# Patient Record
Sex: Female | Born: 1995 | Race: Black or African American | Hispanic: No | Marital: Single | Smoking: Never smoker
Health system: Southern US, Community
[De-identification: ages and names within clinical notes are randomized; demographics above are authoritative.]

## PROBLEM LIST (undated history)

## (undated) DIAGNOSIS — I10 Essential (primary) hypertension: Secondary | ICD-10-CM

## (undated) DIAGNOSIS — R109 Unspecified abdominal pain: Secondary | ICD-10-CM

## (undated) DIAGNOSIS — J45909 Unspecified asthma, uncomplicated: Secondary | ICD-10-CM

## (undated) DIAGNOSIS — O26892 Other specified pregnancy related conditions, second trimester: Secondary | ICD-10-CM

## (undated) DIAGNOSIS — K219 Gastro-esophageal reflux disease without esophagitis: Secondary | ICD-10-CM

## (undated) HISTORY — DX: Other specified pregnancy related conditions, second trimester: O26.892

## (undated) HISTORY — PX: WISDOM TOOTH EXTRACTION: SHX21

## (undated) HISTORY — DX: Unspecified abdominal pain: R10.9

## (undated) HISTORY — DX: Unspecified asthma, uncomplicated: J45.909

---
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
## 2013-10-02 ENCOUNTER — Emergency Department: Payer: Self-pay | Admitting: Emergency Medicine

## 2013-10-02 LAB — URINALYSIS, COMPLETE
BLOOD: NEGATIVE
Bilirubin,UR: NEGATIVE
GLUCOSE, UR: NEGATIVE mg/dL (ref 0–75)
Ketone: NEGATIVE
LEUKOCYTE ESTERASE: NEGATIVE
Nitrite: NEGATIVE
PH: 6 (ref 4.5–8.0)
Protein: NEGATIVE
SPECIFIC GRAVITY: 1.024 (ref 1.003–1.030)
Squamous Epithelial: 10
WBC UR: 2 /HPF (ref 0–5)

## 2013-10-02 LAB — WET PREP, GENITAL

## 2013-10-02 LAB — GC/CHLAMYDIA PROBE AMP

## 2013-10-08 ENCOUNTER — Emergency Department: Payer: Self-pay | Admitting: Emergency Medicine

## 2014-12-03 ENCOUNTER — Emergency Department
Admission: EM | Admit: 2014-12-03 | Discharge: 2014-12-03 | Disposition: A | Discharge status: Home / Self Care | Payer: Medicaid Other | Attending: Emergency Medicine | Admitting: Emergency Medicine

## 2014-12-03 ENCOUNTER — Encounter: Payer: Self-pay | Admitting: Emergency Medicine

## 2014-12-03 DIAGNOSIS — R1013 Epigastric pain: Secondary | ICD-10-CM

## 2014-12-03 DIAGNOSIS — Z3202 Encounter for pregnancy test, result negative: Secondary | ICD-10-CM | POA: Insufficient documentation

## 2014-12-03 LAB — URINALYSIS COMPLETE WITH MICROSCOPIC (ARMC ONLY)
BACTERIA UA: NONE SEEN
Bilirubin Urine: NEGATIVE
GLUCOSE, UA: NEGATIVE mg/dL
HGB URINE DIPSTICK: NEGATIVE
KETONES UR: NEGATIVE mg/dL
LEUKOCYTES UA: NEGATIVE
NITRITE: NEGATIVE
Protein, ur: NEGATIVE mg/dL
SPECIFIC GRAVITY, URINE: 1.018 (ref 1.005–1.030)
pH: 7 (ref 5.0–8.0)

## 2014-12-03 LAB — COMPREHENSIVE METABOLIC PANEL
ALK PHOS: 92 U/L (ref 38–126)
ALT: 14 U/L (ref 14–54)
ANION GAP: 7 (ref 5–15)
AST: 20 U/L (ref 15–41)
Albumin: 4.5 g/dL (ref 3.5–5.0)
BILIRUBIN TOTAL: 0.7 mg/dL (ref 0.3–1.2)
BUN: 11 mg/dL (ref 6–20)
CALCIUM: 9.4 mg/dL (ref 8.9–10.3)
CO2: 27 mmol/L (ref 22–32)
Chloride: 101 mmol/L (ref 101–111)
Creatinine, Ser: 0.77 mg/dL (ref 0.44–1.00)
GFR calc Af Amer: >60 mL/min (ref >60)
GLUCOSE: 82 mg/dL (ref 65–99)
POTASSIUM: 3.8 mmol/L (ref 3.5–5.1)
Sodium: 135 mmol/L (ref 135–145)
Total Protein: 7.7 g/dL (ref 6.5–8.1)

## 2014-12-03 LAB — CBC
HEMATOCRIT: 41.1 % (ref 35.0–47.0)
HEMOGLOBIN: 13.8 g/dL (ref 12.0–16.0)
MCH: 29.7 pg (ref 26.0–34.0)
MCHC: 33.5 g/dL (ref 32.0–36.0)
MCV: 88.5 fL (ref 80.0–100.0)
Platelets: 243 10*3/uL (ref 150–440)
RBC: 4.64 MIL/uL (ref 3.80–5.20)
RDW: 12.7 % (ref 11.5–14.5)
WBC: 8.5 10*3/uL (ref 3.6–11.0)

## 2014-12-03 LAB — POCT PREGNANCY, URINE: Preg Test, Ur: NEGATIVE

## 2014-12-03 LAB — LIPASE, BLOOD: Lipase: 34 U/L (ref 22–51)

## 2014-12-03 MED ORDER — GI COCKTAIL ~~LOC~~
ORAL | Status: AC
  Filled 2014-12-03: qty 30

## 2014-12-03 MED ORDER — GI COCKTAIL ~~LOC~~
30.0000 mL | Freq: Once | ORAL | Status: AC
  Administered 2014-12-03: 30 mL via ORAL

## 2014-12-03 MED ORDER — SUCRALFATE 1 G PO TABS: 1.0000 g | ORAL_TABLET | Freq: Four times a day (QID) | ORAL | Status: DC

## 2014-12-03 MED ORDER — PANTOPRAZOLE SODIUM 40 MG PO TBEC: 40.0000 mg | DELAYED_RELEASE_TABLET | Freq: Every day | ORAL | Status: DC

## 2014-12-03 NOTE — ED Provider Notes (Signed)
[facility] Emergency Department Provider Note   ____________________________________________  Time seen: 1700  I have reviewed the triage vital signs and the nursing notes.   HISTORY  Chief Complaint Abdominal Pain   History limited by: Not Limited   HPI Tamara Flynn is a 19 y.o. female who presents to the emergency department today with chief complaint ofabdominal pain. The patient states it is located in the epigastric region. It is sharp. It is severe. She states it started yesterday. It has been constant. She has had some associated bilateral back pain. She has had some nausea but no vomiting. Denies any change in defecation. Denies that the pain is worse with eating.     History reviewed. No pertinent past medical history.  There are no active problems to display for this patient.   History reviewed. No pertinent past surgical history.  No current outpatient prescriptions on file.  Allergies Review of patient's allergies indicates no known allergies.  History reviewed. No pertinent family history.  Social History Social History  Substance Use Topics  . Smoking status: Never Smoker   . Smokeless tobacco: None  . Alcohol Use: No    Review of Systems  Constitutional: Negative for fever. Cardiovascular: Negative for chest pain. Respiratory: Negative for shortness of breath. Gastrointestinal: Positive for abdominal pain Genitourinary: Negative for dysuria. Musculoskeletal: Negative for back pain. Skin: Negative for rash. Neurological: Negative for headaches, focal weakness or numbness.  10-point ROS otherwise negative.  ____________________________________________   PHYSICAL EXAM:  VITAL SIGNS: ED Triage Vitals  Enc Vitals Group     BP 12/03/14 1622 117/76 mmHg     Pulse Rate 12/03/14 1622 94     Resp 12/03/14 1622 20     Temp 12/03/14 1622 98.3 F (36.8 C)     Temp Source 12/03/14 1622 Oral     SpO2 12/03/14 1622  99 %     Weight 12/03/14 1622 118 lb (53.524 kg)     Height 12/03/14 1622 5' (1.524 m)     Head Cir --      Peak Flow --      Pain Score 12/03/14 1604 6   Constitutional: Alert and oriented. Well appearing and in no distress. Eyes: Conjunctivae are normal. PERRL. Normal extraocular movements. ENT   Head: Normocephalic and atraumatic.   Nose: No congestion/rhinnorhea.   Mouth/Throat: Mucous membranes are moist.   Neck: No stridor. Hematological/Lymphatic/Immunilogical: No cervical lymphadenopathy. Cardiovascular: Normal rate, regular rhythm.  No murmurs, rubs, or gallops. Respiratory: Normal respiratory effort without tachypnea nor retractions. Breath sounds are clear and equal bilaterally. No wheezes/rales/rhonchi. Gastrointestinal: Soft and nontender. No distention. There is no CVA tenderness. Genitourinary: Deferred Musculoskeletal: Normal range of motion in all extremities. No joint effusions.  No lower extremity tenderness nor edema. Neurologic:  Normal speech and language. No gross focal neurologic deficits are appreciated. Speech is normal.  Skin:  Skin is warm, dry and intact. No rash noted. Psychiatric: Mood and affect are normal. Speech and behavior are normal. Patient exhibits appropriate insight and judgment.  ____________________________________________    LABS (pertinent positives/negatives)  Labs Reviewed  URINALYSIS COMPLETEWITH MICROSCOPIC ([place] ONLY) - Abnormal; Notable for the following:    Color, Urine YELLOW (*)    APPearance HAZY (*)    Squamous Epithelial / LPF 6-30 (*)    All other components within normal limits  LIPASE, BLOOD  COMPREHENSIVE METABOLIC PANEL  CBC  POCT PREGNANCY, URINE     ____________________________________________   EKG  None  ____________________________________________    RADIOLOGY  None    ____________________________________________   PROCEDURES  Procedure(s) performed: None  Critical Care  performed: No  ____________________________________________   INITIAL IMPRESSION / ASSESSMENT AND PLAN / ED COURSE  Pertinent labs & imaging results that were available during my care of the patient were reviewed by me and considered in my medical decision making (see chart for details).  Patient presented to the emergency department today with epigastric pain. Blood work without any findings consistent with bad bacterial infection or pancreatitis. Liver enzymes normal. Bedside ultrasound without any gallstones. Patient stated she did feel better after a GI Cocktail. Will discharge home with antacids and sucralfate.  ____________________________________________   FINAL CLINICAL IMPRESSION(S) / ED DIAGNOSES  Final diagnoses:  Epigastric pain     Nance Pear, MD 12/03/14 1819

## 2014-12-03 NOTE — ED Notes (Signed)
19 year old female ambulatory without difficulty to ED33 with multiple medical complaints. Pt reports bilateral flank pain with "sharp pains that bring me to my knees" in addition to epigastric pain which, "I can't describe but I know it's there and it makes it harder to breathe." Pt also reports bilateral knee pain. Pt denies knee injury and notes that "sometimes my knees just give out." Pt also reports "migraine" headache to right side of head. Pt reports that she gets headaches often and "they just go away on their own." Pt reports that this headache is different because it is worse, "and when I shut my eyes, I can feel it there." Pt reports nausea but denies vomiting. Denies visual change or sensitivity to light. Of note, pt reports burning with urination. Pt is awake and alert with no acute distress noted during assessment.

## 2014-12-03 NOTE — ED Notes (Signed)
POCT pregnancy Negative

## 2014-12-03 NOTE — ED Notes (Signed)
Pt to ed with c/o abd pain and nausea x 2 days.   Denies vomiting, denies diarrhea.

## 2014-12-03 NOTE — Discharge Instructions (Signed)
Please seek medical attention for any high fevers, chest pain, shortness of breath, change in behavior, persistent vomiting, bloody stool or any other new or concerning symptoms. ° °Abdominal Pain °Many things can cause abdominal pain. Usually, abdominal pain is not caused by a disease and will improve without treatment. It can often be observed and treated at home. Your health care provider will do a physical exam and possibly order blood tests and X-rays to help determine the seriousness of your pain. However, in many cases, more time must pass before a clear cause of the pain can be found. Before that point, your health care provider may not know if you need more testing or further treatment. °HOME CARE INSTRUCTIONS  °Monitor your abdominal pain for any changes. The following actions may help to alleviate any discomfort you are experiencing: °· Only take over-the-counter or prescription medicines as directed by your health care provider. °· Do not take laxatives unless directed to do so by your health care provider. °· Try a clear liquid diet (broth, tea, or water) as directed by your health care provider. Slowly move to a bland diet as tolerated. °SEEK MEDICAL CARE IF: °· You have unexplained abdominal pain. °· You have abdominal pain associated with nausea or diarrhea. °· You have pain when you urinate or have a bowel movement. °· You experience abdominal pain that wakes you in the night. °· You have abdominal pain that is worsened or improved by eating food. °· You have abdominal pain that is worsened with eating fatty foods. °· You have a fever. °SEEK IMMEDIATE MEDICAL CARE IF:  °· Your pain does not go away within 2 hours. °· You keep throwing up (vomiting). °· Your pain is felt only in portions of the abdomen, such as the right side or the left lower portion of the abdomen. °· You pass bloody or black tarry stools. °MAKE SURE YOU: °· Understand these instructions.   °· Will watch your condition.   °· Will  get help right away if you are not doing well or get worse.   °Document Released: 12/02/2004 Document Revised: 02/27/2013 Document Reviewed: 11/01/2012 °[employer]® Patient Information ©2015 [employer]. This information is not intended to replace advice given to you by your health care provider. Make sure you discuss any questions you have with your health care provider. ° °

## 2015-07-24 ENCOUNTER — Encounter: Payer: Self-pay | Admitting: Emergency Medicine

## 2015-07-24 ENCOUNTER — Emergency Department
Admission: EM | Admit: 2015-07-24 | Discharge: 2015-07-24 | Disposition: A | Discharge status: Home / Self Care | Payer: Medicaid Other | Attending: Student | Admitting: Student

## 2015-07-24 DIAGNOSIS — R112 Nausea with vomiting, unspecified: Secondary | ICD-10-CM | POA: Diagnosis not present

## 2015-07-24 DIAGNOSIS — R519 Headache, unspecified: Secondary | ICD-10-CM

## 2015-07-24 DIAGNOSIS — R42 Dizziness and giddiness: Secondary | ICD-10-CM | POA: Insufficient documentation

## 2015-07-24 DIAGNOSIS — R51 Headache: Secondary | ICD-10-CM | POA: Diagnosis not present

## 2015-07-24 LAB — URINALYSIS COMPLETE WITH MICROSCOPIC (ARMC ONLY)
BILIRUBIN URINE: NEGATIVE
Bacteria, UA: NONE SEEN
GLUCOSE, UA: NEGATIVE mg/dL
HGB URINE DIPSTICK: NEGATIVE
KETONES UR: NEGATIVE mg/dL
LEUKOCYTES UA: NEGATIVE
NITRITE: NEGATIVE
PH: 5 (ref 5.0–8.0)
Protein, ur: NEGATIVE mg/dL
SPECIFIC GRAVITY, URINE: 1.025 (ref 1.005–1.030)

## 2015-07-24 LAB — CBC
HCT: 40.3 % (ref 35.0–47.0)
Hemoglobin: 13.6 g/dL (ref 12.0–16.0)
MCH: 29.7 pg (ref 26.0–34.0)
MCHC: 33.8 g/dL (ref 32.0–36.0)
MCV: 87.9 fL (ref 80.0–100.0)
PLATELETS: 255 10*3/uL (ref 150–440)
RBC: 4.58 MIL/uL (ref 3.80–5.20)
RDW: 12.8 % (ref 11.5–14.5)
WBC: 10.6 10*3/uL (ref 3.6–11.0)

## 2015-07-24 LAB — COMPREHENSIVE METABOLIC PANEL
ALK PHOS: 92 U/L (ref 38–126)
ALT: 15 U/L (ref 14–54)
ANION GAP: 8 (ref 5–15)
AST: 20 U/L (ref 15–41)
Albumin: 4.4 g/dL (ref 3.5–5.0)
BILIRUBIN TOTAL: 0.3 mg/dL (ref 0.3–1.2)
BUN: 10 mg/dL (ref 6–20)
CALCIUM: 9.1 mg/dL (ref 8.9–10.3)
CO2: 26 mmol/L (ref 22–32)
Chloride: 105 mmol/L (ref 101–111)
Creatinine, Ser: 0.77 mg/dL (ref 0.44–1.00)
GLUCOSE: 93 mg/dL (ref 65–99)
POTASSIUM: 3.6 mmol/L (ref 3.5–5.1)
Sodium: 139 mmol/L (ref 135–145)
TOTAL PROTEIN: 7.5 g/dL (ref 6.5–8.1)

## 2015-07-24 LAB — POCT PREGNANCY, URINE: Preg Test, Ur: NEGATIVE

## 2015-07-24 LAB — LIPASE, BLOOD: LIPASE: 31 U/L (ref 11–51)

## 2015-07-24 MED ORDER — ONDANSETRON HCL 4 MG/2ML IJ SOLN
4.0000 mg | Freq: Once | INTRAMUSCULAR | Status: AC
  Administered 2015-07-24: 4 mg via INTRAVENOUS
  Filled 2015-07-24: qty 2

## 2015-07-24 MED ORDER — IBUPROFEN 600 MG PO TABS
600.0000 mg | ORAL_TABLET | Freq: Once | ORAL | Status: AC
  Administered 2015-07-24: 600 mg via ORAL
  Filled 2015-07-24: qty 1

## 2015-07-24 MED ORDER — ACETAMINOPHEN 500 MG PO TABS
1000.0000 mg | ORAL_TABLET | Freq: Once | ORAL | Status: AC
  Administered 2015-07-24: 1000 mg via ORAL
  Filled 2015-07-24: qty 2

## 2015-07-24 MED ORDER — SODIUM CHLORIDE 0.9 % IV BOLUS (SEPSIS)
1000.0000 mL | Freq: Once | INTRAVENOUS | Status: AC
  Administered 2015-07-24: 1000 mL via INTRAVENOUS

## 2015-07-24 NOTE — ED Notes (Signed)
Patient presents to the ED via EMS from work. Patient reports that she became dizzy and started to vomit while at work tonight; felt pre-syncopal. Of note, patient has had a non-specific headache x the last 4 days.

## 2015-07-24 NOTE — ED Notes (Signed)
MD Gayle at bedside. 

## 2015-07-24 NOTE — ED Notes (Deleted)
Patient presents to the ED via EMS from work. Patient reports that she became dizzy and started to vomit while at work tonight; felt pre-syncopal. Of note, patient has had a non-specific headache x the last 4 days.

## 2015-07-24 NOTE — ED Provider Notes (Addendum)
[facility] Emergency Department Provider Note   ____________________________________________  Time seen: Approximately 10:55 PM  I have reviewed the triage vital signs and the nursing notes.   HISTORY  Chief Complaint Dizziness; Headache; Nausea; and Emesis    HPI Tamara Flynn is a 20 y.o. female with no chronic medical problems who presents for evaluation of lightheadedness and vomiting today, gradual onset, initially severe, now resolved, no modifying factors. The patient reports that she was at work when she began feeling lightheaded and as if she was going to vomit. She had 3 episodes of nonbloody nonbilious emesis, felt as is she were going to faint and EMS was called. She did not faint. Currently she reports she feels well. She also reports that over the past 4 days she has had intermittent frontal headaches, currently the headache as 7 out of 10. She has not taking any medications to help alleviate the pain. She reports she has had headaches similar to this in the past, often times associated with photophobia and she currently has mild photophobia. She carries no formal diagnosis of migraines. No fevers, no abdominal pain, no diarrhea, no chest pain or difficulty breathing.   History reviewed. No pertinent past medical history.  There are no active problems to display for this patient.   History reviewed. No pertinent past surgical history.  Current Outpatient Rx  Name  Route  Sig  Dispense  Refill  . pantoprazole (PROTONIX) 40 MG tablet   Oral   Take 1 tablet (40 mg total) by mouth daily.   30 tablet   1   . sucralfate (CARAFATE) 1 G tablet   Oral   Take 1 tablet (1 g total) by mouth 4 (four) times daily.   60 tablet   0     Allergies Review of patient's allergies indicates no known allergies.  No family history on file.  Social History Social History  Substance Use Topics  . Smoking status: Never Smoker   . Smokeless  tobacco: None  . Alcohol Use: No    Review of Systems Constitutional: No fever/chills Eyes: No visual changes. ENT: No sore throat. Cardiovascular: Denies chest pain. Respiratory: Denies shortness of breath. Gastrointestinal: No abdominal pain.  +nausea, + vomiting.  No diarrhea.  No constipation. Genitourinary: Negative for dysuria. Musculoskeletal: Negative for back pain. Skin: Negative for rash. Neurological: Positive for headaches, no focal weakness or numbness.  10-point ROS otherwise negative.  ____________________________________________   PHYSICAL EXAM:  VITAL SIGNS: ED Triage Vitals  Enc Vitals Group     BP 07/24/15 2121 115/72 mmHg     Pulse Rate 07/24/15 2121 90     Resp 07/24/15 2121 14     Temp 07/24/15 2121 98.9 F (37.2 C)     Temp Source 07/24/15 2121 Oral     SpO2 07/24/15 2121 98 %     Weight 07/24/15 2121 129 lb (58.514 kg)     Height 07/24/15 2121 5' (1.524 m)     Head Cir --      Peak Flow --      Pain Score 07/24/15 2122 7     Pain Loc --      Pain Edu? --      Excl. in [place]? --     Constitutional: Alert and oriented. Well appearing and in no acute distress. Eyes: Conjunctivae are normal. PERRL. EOMI. Head: Atraumatic. Nose: No congestion/rhinnorhea. Mouth/Throat: Mucous membranes are moist.  Oropharynx non-erythematous. Neck: No stridor.  No  cervical spine tenderness to palpation. Supple without meningismus. Cardiovascular: Normal rate, regular rhythm. Grossly normal heart sounds.  Good peripheral circulation. Respiratory: Normal respiratory effort.  No retractions. Lungs CTAB. Gastrointestinal: Soft and nontender. No distention. No CVA tenderness. Genitourinary: deferred Musculoskeletal: No lower extremity tenderness nor edema.  No joint effusions. Neurologic:  Normal speech and language. No gross focal neurologic deficits are appreciated. No gait instability. 5 out of 5 strength bilateral upper and lower extremities, sensation intact to  light touch throughout, cranial nerves II through XII intact. Skin:  Skin is warm, dry and intact. No rash noted. Psychiatric: Mood and affect are normal. Speech and behavior are normal.  ____________________________________________   LABS (all labs ordered are listed, but only abnormal results are displayed)  Labs Reviewed  URINALYSIS COMPLETEWITH MICROSCOPIC ([place]) - Abnormal; Notable for the following:    Color, Urine YELLOW (*)    APPearance CLEAR (*)    Squamous Epithelial / LPF 0-5 (*)    All other components within normal limits  CBC  COMPREHENSIVE METABOLIC PANEL  LIPASE, BLOOD  POC URINE PREG, ED  POCT PREGNANCY, URINE   ____________________________________________  EKG  ED ECG REPORT I, Joanne Gavel, the attending physician, personally viewed and interpreted this ECG.   Date: 07/24/2015  EKG Time: 21:26  Rate: 80  Rhythm: normal EKG, normal sinus rhythm  Axis: normal  Intervals:none  ST&T Change: No acute ST elevation.  ____________________________________________  RADIOLOGY  none ____________________________________________   PROCEDURES  Procedure(s) performed: None  Critical Care performed: No  ____________________________________________   INITIAL IMPRESSION / ASSESSMENT AND PLAN / ED COURSE  Pertinent labs & imaging results that were available during my care of the patient were reviewed by me and considered in my medical decision making (see chart for details).  Tamara Flynn is a 20 y.o. female with no chronic medical problems who presents for evaluation of lightheadedness and vomiting today. On exam, she is very well-appearing and in no acute distress, her vital signs are stable, she is afebrile. She has an intact neurological examination as well as a benign abdominal exam. I reviewed her labs. CBC, CMP, lipase unremarkable. Negative pregnancy test, urinalysis consistent with infection. Nonspecific lightheadedness and vomiting,  possibly secondary to viral syndrome or possibly related to headache/migraine. Doubt meningitis or subarachnoid hemorrhage. Patient reports that she feels much better after IV fluids and Zofran as well as Motrin and Tylenol. Currently, she reports complete resolution of her headache. She is tolerating by mouth intake without vomiting. Discussed return precautions, new for close PCP follow-up and she is comfortable with the discharge plan. DC home. ____________________________________________   FINAL CLINICAL IMPRESSION(S) / ED DIAGNOSES  Final diagnoses:  Lightheadedness  Non-intractable vomiting with nausea, vomiting of unspecified type  Acute nonintractable headache, unspecified headache type      NEW MEDICATIONS STARTED DURING THIS VISIT:  New Prescriptions   No medications on file     Note:  This document was prepared using Dragon voice recognition software and may include unintentional dictation errors.    Joanne Gavel, MD 07/24/15 [id number]  Joanne Gavel, MD 07/24/15 [id number]

## 2015-07-24 NOTE — ED Notes (Signed)
Pt given urine cup and instructed on proper use. Pt verbalized understanding. NAD noted.

## 2015-07-29 ENCOUNTER — Emergency Department: Payer: Medicaid Other

## 2015-07-29 ENCOUNTER — Emergency Department
Admission: EM | Admit: 2015-07-29 | Discharge: 2015-07-29 | Disposition: A | Discharge status: Home / Self Care | Payer: Medicaid Other | Attending: Emergency Medicine | Admitting: Emergency Medicine

## 2015-07-29 ENCOUNTER — Encounter: Payer: Self-pay | Admitting: *Deleted

## 2015-07-29 DIAGNOSIS — S0093XA Contusion of unspecified part of head, initial encounter: Secondary | ICD-10-CM | POA: Insufficient documentation

## 2015-07-29 DIAGNOSIS — Y999 Unspecified external cause status: Secondary | ICD-10-CM | POA: Diagnosis not present

## 2015-07-29 DIAGNOSIS — Z79899 Other long term (current) drug therapy: Secondary | ICD-10-CM | POA: Insufficient documentation

## 2015-07-29 DIAGNOSIS — Y92410 Unspecified street and highway as the place of occurrence of the external cause: Secondary | ICD-10-CM | POA: Diagnosis not present

## 2015-07-29 DIAGNOSIS — S0990XA Unspecified injury of head, initial encounter: Secondary | ICD-10-CM | POA: Diagnosis present

## 2015-07-29 DIAGNOSIS — Y9389 Activity, other specified: Secondary | ICD-10-CM | POA: Insufficient documentation

## 2015-07-29 MED ORDER — ONDANSETRON 8 MG PO TBDP
8.0000 mg | ORAL_TABLET | Freq: Once | ORAL | Status: AC
  Administered 2015-07-29: 8 mg via ORAL
  Filled 2015-07-29: qty 1

## 2015-07-29 MED ORDER — IBUPROFEN 800 MG PO TABS
800.0000 mg | ORAL_TABLET | Freq: Once | ORAL | Status: AC
  Administered 2015-07-29: 800 mg via ORAL
  Filled 2015-07-29: qty 1

## 2015-07-29 MED ORDER — ONDANSETRON HCL 8 MG PO TABS: 8.0000 mg | ORAL_TABLET | Freq: Three times a day (TID) | ORAL | Status: DC | PRN

## 2015-07-29 MED ORDER — TRAMADOL HCL 50 MG PO TABS: 50.0000 mg | ORAL_TABLET | Freq: Four times a day (QID) | ORAL | Status: DC | PRN

## 2015-07-29 MED ORDER — IBUPROFEN 800 MG PO TABS: 800.0000 mg | ORAL_TABLET | Freq: Three times a day (TID) | ORAL | Status: DC | PRN

## 2015-07-29 MED ORDER — TRAMADOL HCL 50 MG PO TABS
50.0000 mg | ORAL_TABLET | Freq: Once | ORAL | Status: AC
  Administered 2015-07-29: 50 mg via ORAL
  Filled 2015-07-29: qty 1
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
## 2015-07-29 NOTE — Discharge Instructions (Signed)
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
## 2016-03-29 ENCOUNTER — Emergency Department
Admission: EM | Admit: 2016-03-29 | Discharge: 2016-03-29 | Disposition: A | Discharge status: Home / Self Care | Payer: Medicaid Other | Attending: Emergency Medicine | Admitting: Emergency Medicine

## 2016-03-29 ENCOUNTER — Encounter: Payer: Self-pay | Admitting: *Deleted

## 2016-03-29 DIAGNOSIS — J029 Acute pharyngitis, unspecified: Secondary | ICD-10-CM | POA: Insufficient documentation

## 2016-03-29 NOTE — ED Provider Notes (Signed)
[facility] Emergency Department Provider Note  ___________________________________________   First MD Initiated Contact with Patient 03/29/16 1315     (approximate)  I have reviewed the triage vital signs and the nursing notes.   HISTORY  Chief Complaint Sore Throat   HPI Tamara Flynn is a 21 y.o. female is here with complaint of sore throat that she woke up with this morning. Patient denies any exposure to strep throat, no fever or chills, no nausea or vomiting. Patient denies any upper respiratory symptoms. Patient states she has not taken any over-the-counter medication for her throat pain. She rates her pain as 6/10.   History reviewed. No pertinent past medical history.  There are no active problems to display for this patient.   History reviewed. No pertinent surgical history.  Prior to Admission medications   Medication Sig Start Date End Date Taking? Authorizing Provider  ibuprofen (ADVIL,MOTRIN) 800 MG tablet Take 1 tablet (800 mg total) by mouth every 8 (eight) hours as needed. 07/29/15   Sable Feil, PA-C  ondansetron (ZOFRAN) 8 MG tablet Take 1 tablet (8 mg total) by mouth every 8 (eight) hours as needed for nausea or vomiting. 07/29/15   Sable Feil, PA-C  pantoprazole (PROTONIX) 40 MG tablet Take 1 tablet (40 mg total) by mouth daily. 12/03/14 12/03/15  Nance Pear, MD  sucralfate (CARAFATE) 1 G tablet Take 1 tablet (1 g total) by mouth 4 (four) times daily. 12/03/14 12/03/15  Nance Pear, MD  traMADol (ULTRAM) 50 MG tablet Take 1 tablet (50 mg total) by mouth every 6 (six) hours as needed for moderate pain. 07/29/15   Sable Feil, PA-C    Allergies Patient has no known allergies.  History reviewed. No pertinent family history.  Social History Social History  Substance Use Topics  . Smoking status: Never Smoker  . Smokeless tobacco: Not on file  . Alcohol use No    Review of Systems Constitutional: No  fever/chills Eyes: No visual changes. ENT: Positive sore throat. Negative ear pain. Cardiovascular: Denies chest pain. Respiratory: Denies shortness of breath. Gastrointestinal:  No nausea, no vomiting.  Musculoskeletal: Denies muscle aches. Skin: Negative for rash. Neurological: Negative for headaches, focal weakness or numbness.  10-point ROS otherwise negative.  ____________________________________________   PHYSICAL EXAM:  VITAL SIGNS: ED Triage Vitals [03/29/16 1258]  Enc Vitals Group     BP 129/80     Pulse Rate (!) 102     Resp 18     Temp 98.1 F (36.7 C)     Temp Source Oral     SpO2 98 %     Weight 122 lb (55.3 kg)     Height 5' (1.524 m)     Head Circumference      Peak Flow      Pain Score 6     Pain Loc      Pain Edu?      Excl. in [place]?     Constitutional: Alert and oriented. Well appearing and in no acute distress. Eyes: Conjunctivae are normal. PERRL. EOMI. Head: Atraumatic. Nose: No congestion/rhinnorhea. EACs and TMs are clear bilaterally. Mouth/Throat: Mucous membranes are moist.  Oropharynx non-erythematous. Positive posterior drainage noted. Neck: No stridor.   Hematological/Lymphatic/Immunilogical: No cervical lymphadenopathy. Cardiovascular: Normal rate, regular rhythm. Grossly normal heart sounds.  Good peripheral circulation. Respiratory: Normal respiratory effort.  No retractions. Lungs CTAB. Musculoskeletal: His upper and lower extremities without difficulty. Normal gait was noted. Neurologic:  Normal speech  and language. No gross focal neurologic deficits are appreciated. No gait instability. Skin:  Skin is warm, dry and intact. No rash noted. Psychiatric: Mood and affect are normal. Speech and behavior are normal.  ____________________________________________   LABS (all labs ordered are listed, but only abnormal results are displayed)  Labs Reviewed - No data to display   PROCEDURES  Procedure(s) performed:  None  Procedures  Critical Care performed: No  ____________________________________________   INITIAL IMPRESSION / ASSESSMENT AND PLAN / ED COURSE  Pertinent labs & imaging results that were available during my care of the patient were reviewed by me and considered in my medical decision making (see chart for details).  Patient was made aware that her throat was not red and did not show any signs of exudate. Neck was negative for adenopathy. Patient was instructed to obtain over-the-counter Zyrtec or Claritin. She may also use saline nose spray. We also discussed humidifier if her house is particularly dry. She'll follow up with [facility] if any continued problems.    ___________________________________________   FINAL CLINICAL IMPRESSION(S) / ED DIAGNOSES  Final diagnoses:  Sore throat      NEW MEDICATIONS STARTED DURING THIS VISIT:  New Prescriptions   No medications on file     Note:  This document was prepared using Dragon voice recognition software and may include unintentional dictation errors.    Johnn Hai, PA-C 03/29/16 [place], MD 03/29/16 1400

## 2016-03-29 NOTE — ED Triage Notes (Signed)
States sore throat since this AM

## 2016-03-29 NOTE — Discharge Instructions (Signed)
Take Tylenol or ibuprofen as needed for throat pain. Use saltwater gargles frequently. Avoid foods with high acid content such as tomatoes or orange juice. Take over-the-counter decongestants/antihistamines such as Zyrtec or Claritin for posterior drainage. You may also use saline nose spray. Follow-up with her primary care doctor if any continued problems.

## 2016-03-30 ENCOUNTER — Encounter: Payer: Self-pay | Admitting: Emergency Medicine

## 2016-03-30 ENCOUNTER — Emergency Department
Admission: EM | Admit: 2016-03-30 | Discharge: 2016-03-30 | Disposition: A | Discharge status: Home / Self Care | Payer: Medicaid Other | Attending: Emergency Medicine | Admitting: Emergency Medicine

## 2016-03-30 DIAGNOSIS — J020 Streptococcal pharyngitis: Secondary | ICD-10-CM

## 2016-03-30 DIAGNOSIS — Z79899 Other long term (current) drug therapy: Secondary | ICD-10-CM | POA: Insufficient documentation

## 2016-03-30 DIAGNOSIS — H9201 Otalgia, right ear: Secondary | ICD-10-CM

## 2016-03-30 LAB — POCT RAPID STREP A: Streptococcus, Group A Screen (Direct): POSITIVE — AB

## 2016-03-30 MED ORDER — DIPHENHYDRAMINE HCL 12.5 MG/5ML PO ELIX
25.0000 mg | ORAL_SOLUTION | Freq: Once | ORAL | Status: AC
  Administered 2016-03-30: 25 mg via ORAL
  Filled 2016-03-30: qty 10

## 2016-03-30 MED ORDER — PSEUDOEPH-BROMPHEN-DM 30-2-10 MG/5ML PO SYRP: 5.0000 mL | ORAL_SOLUTION | Freq: Four times a day (QID) | ORAL | 0 refills | Status: DC | PRN

## 2016-03-30 MED ORDER — ACETAMINOPHEN 325 MG PO TABS
650.0000 mg | ORAL_TABLET | Freq: Once | ORAL | Status: AC
  Administered 2016-03-30: 650 mg via ORAL
  Filled 2016-03-30: qty 2

## 2016-03-30 MED ORDER — IBUPROFEN 600 MG PO TABS: 600.0000 mg | ORAL_TABLET | Freq: Three times a day (TID) | ORAL | 0 refills | Status: DC | PRN

## 2016-03-30 MED ORDER — LIDOCAINE VISCOUS 2 % MT SOLN
15.0000 mL | Freq: Once | OROMUCOSAL | Status: AC
  Administered 2016-03-30: 15 mL via OROMUCOSAL
  Filled 2016-03-30: qty 15

## 2016-03-30 MED ORDER — AMOXICILLIN 500 MG PO CAPS: 500.0000 mg | ORAL_CAPSULE | Freq: Three times a day (TID) | ORAL | 0 refills | Status: DC

## 2016-03-30 MED ORDER — IBUPROFEN 600 MG PO TABS: 600.0000 mg | ORAL_TABLET | Freq: Once | ORAL | Status: DC

## 2016-03-30 NOTE — ED Notes (Addendum)
See triage note  Was seen yesterday with sore throat  Was afebrile at that time  Developed fever during and right ear pain during the night  states she took ibu prior to arrival

## 2016-03-30 NOTE — ED Triage Notes (Signed)
Pt reports fever and ear pain.

## 2016-03-30 NOTE — ED Provider Notes (Signed)
[facility] Emergency Department Provider Note   ____________________________________________   First MD Initiated Contact with Patient 03/30/16 1628     (approximate)  I have reviewed the triage vital signs and the nursing notes.   HISTORY  Chief Complaint Otalgia and Fever    HPI Tamara Flynn is a 21 y.o. female patient complaining of fever and right ear pain for 2 days. Patient was seen yesterday for same complaint and was afebrile time. Patient state fever and ear pain develop over night. Patient took 300 mg ibuprofen elixir prior to arrival. Patient rates her pain discomfort as a 6/10. No other palliative measures for his complaint. No other palliative measures for her complaint. Patient has taken a flu shot this season. Patient denies nausea, vomiting, or diarrhea.  History reviewed. No pertinent past medical history.  There are no active problems to display for this patient.   History reviewed. No pertinent surgical history.  Prior to Admission medications   Medication Sig Start Date End Date Taking? Authorizing Provider  amoxicillin (AMOXIL) 500 MG capsule Take 1 capsule (500 mg total) by mouth 3 (three) times daily. 03/30/16   Sable Feil, PA-C  brompheniramine-pseudoephedrine-DM 30-2-10 MG/5ML syrup Take 5 mLs by mouth 4 (four) times daily as needed. 03/30/16   Sable Feil, PA-C  ibuprofen (ADVIL,MOTRIN) 600 MG tablet Take 1 tablet (600 mg total) by mouth every 8 (eight) hours as needed. 03/30/16   Sable Feil, PA-C  ibuprofen (ADVIL,MOTRIN) 800 MG tablet Take 1 tablet (800 mg total) by mouth every 8 (eight) hours as needed. 07/29/15   Sable Feil, PA-C  ondansetron (ZOFRAN) 8 MG tablet Take 1 tablet (8 mg total) by mouth every 8 (eight) hours as needed for nausea or vomiting. 07/29/15   Sable Feil, PA-C  pantoprazole (PROTONIX) 40 MG tablet Take 1 tablet (40 mg total) by mouth daily. 12/03/14 12/03/15  Nance Pear, MD    sucralfate (CARAFATE) 1 G tablet Take 1 tablet (1 g total) by mouth 4 (four) times daily. 12/03/14 12/03/15  Nance Pear, MD  traMADol (ULTRAM) 50 MG tablet Take 1 tablet (50 mg total) by mouth every 6 (six) hours as needed for moderate pain. 07/29/15   Sable Feil, PA-C    Allergies Patient has no known allergies.  No family history on file.  Social History Social History  Substance Use Topics  . Smoking status: Never Smoker  . Smokeless tobacco: Never Used  . Alcohol use No    Review of Systems Constitutional: Fever Eyes: No visual changes. ENT: Sore throat and right ear pain  Cardiovascular: Denies chest pain. Respiratory: Denies shortness of breath. Gastrointestinal: No abdominal pain.  No nausea, no vomiting.  No diarrhea.  No constipation. Genitourinary: Negative for dysuria. Musculoskeletal: Negative for back pain. Skin: Negative for rash. Neurological: Negative for headaches, focal weakness or numbness.   ____________________________________________   PHYSICAL EXAM:  VITAL SIGNS: ED Triage Vitals  Enc Vitals Group     BP 03/30/16 1611 124/82     Pulse Rate 03/30/16 1611 (!) 116     Resp 03/30/16 1611 18     Temp 03/30/16 1611 (!) 103.1 F (39.5 C)     Temp Source 03/30/16 1611 Oral     SpO2 03/30/16 1611 98 %     Weight 03/30/16 1607 122 lb (55.3 kg)     Height 03/30/16 1607 5' (1.524 m)     Head Circumference --  Peak Flow --      Pain Score 03/30/16 1608 6     Pain Loc --      Pain Edu? --      Excl. in [place]? --     Constitutional: Alert and oriented. Well appearing and in no acute distress. Eyes: Conjunctivae are normal. PERRL. EOMI. Head: Atraumatic. Nose: No congestion/rhinnorhea. Mouth/Throat: Mucous membranes are moist.  Oropharynx erythematous Right tonsil. No visible exudates. Neck: No stridor.  No cervical spine tenderness to palpation. Hematological/Lymphatic/Immunilogical: No cervical lymphadenopathy. Cardiovascular tachycardic  at 116.. Grossly normal heart sounds.  Good peripheral circulation. Respiratory: Normal respiratory effort.  No retractions. Lungs CTAB. Gastrointestinal: Soft and nontender. No distention. No abdominal bruits. No CVA tenderness. Musculoskeletal: No lower extremity tenderness nor edema.  No joint effusions. Neurologic:  Normal speech and language. No gross focal neurologic deficits are appreciated. No gait instability. Skin:  Skin is warm, dry and intact. No rash noted. Psychiatric: Mood and affect are normal. Speech and behavior are normal.  ____________________________________________   LABS (all labs ordered are listed, but only abnormal results are displayed)  Labs Reviewed  POCT RAPID STREP A - Abnormal; Notable for the following:       Result Value   Streptococcus, Group A Screen (Direct) POSITIVE (*)    All other components within normal limits   ____________________________________________  EKG   ____________________________________________  RADIOLOGY   ____________________________________________   PROCEDURES  Procedure(s) performed: None  Procedures  Critical Care performed: No  ____________________________________________   INITIAL IMPRESSION / ASSESSMENT AND PLAN / ED COURSE  Pertinent labs & imaging results that were available during my care of the patient were reviewed by me and considered in my medical decision making (see chart for details).  Positive rapid strep test. Patient given discharge Instructions. Patient given a work note. Patient given prescription for amoxicillin, ibuprofen and Bromfed-DM.      ____________________________________________   FINAL CLINICAL IMPRESSION(S) / ED DIAGNOSES  Final diagnoses:  Strep pharyngitis  Right ear pain      NEW MEDICATIONS STARTED DURING THIS VISIT:  New Prescriptions   AMOXICILLIN (AMOXIL) 500 MG CAPSULE    Take 1 capsule (500 mg total) by mouth 3 (three) times daily.    BROMPHENIRAMINE-PSEUDOEPHEDRINE-DM 30-2-10 MG/5ML SYRUP    Take 5 mLs by mouth 4 (four) times daily as needed.   IBUPROFEN (ADVIL,MOTRIN) 600 MG TABLET    Take 1 tablet (600 mg total) by mouth every 8 (eight) hours as needed.     Note:  This document was prepared using Dragon voice recognition software and may include unintentional dictation errors.    Sable Feil, PA-C 03/30/16 [place], MD 03/30/16 [phone]

## 2016-06-15 LAB — HM PAP SMEAR: HM Pap smear: NEGATIVE

## 2016-08-03 LAB — HM HIV SCREENING LAB: HM HIV Screening: NEGATIVE

## 2017-11-08 ENCOUNTER — Encounter: Payer: Self-pay | Admitting: Emergency Medicine

## 2017-11-08 ENCOUNTER — Emergency Department
Admission: EM | Admit: 2017-11-08 | Discharge: 2017-11-08 | Disposition: A | Discharge status: Home / Self Care | Payer: Medicaid Other | Attending: Emergency Medicine | Admitting: Emergency Medicine

## 2017-11-08 ENCOUNTER — Other Ambulatory Visit: Payer: Self-pay

## 2017-11-08 DIAGNOSIS — M545 Low back pain: Secondary | ICD-10-CM | POA: Diagnosis not present

## 2017-11-08 DIAGNOSIS — R1030 Lower abdominal pain, unspecified: Secondary | ICD-10-CM | POA: Insufficient documentation

## 2017-11-08 DIAGNOSIS — O26891 Other specified pregnancy related conditions, first trimester: Secondary | ICD-10-CM | POA: Insufficient documentation

## 2017-11-08 DIAGNOSIS — Z3A01 Less than 8 weeks gestation of pregnancy: Secondary | ICD-10-CM | POA: Insufficient documentation

## 2017-11-08 DIAGNOSIS — O9989 Other specified diseases and conditions complicating pregnancy, childbirth and the puerperium: Secondary | ICD-10-CM | POA: Diagnosis not present

## 2017-11-08 LAB — COMPREHENSIVE METABOLIC PANEL
ALK PHOS: 69 U/L (ref 38–126)
ALT: 25 U/L (ref 0–44)
AST: 26 U/L (ref 15–41)
Albumin: 4.3 g/dL (ref 3.5–5.0)
Anion gap: 7 (ref 5–15)
BUN: 8 mg/dL (ref 6–20)
CHLORIDE: 103 mmol/L (ref 98–111)
CO2: 26 mmol/L (ref 22–32)
Calcium: 9.1 mg/dL (ref 8.9–10.3)
Creatinine, Ser: 0.65 mg/dL (ref 0.44–1.00)
GLUCOSE: 83 mg/dL (ref 70–99)
POTASSIUM: 3.6 mmol/L (ref 3.5–5.1)
Sodium: 136 mmol/L (ref 135–145)
Total Bilirubin: 0.7 mg/dL (ref 0.3–1.2)
Total Protein: 7.6 g/dL (ref 6.5–8.1)

## 2017-11-08 LAB — CBC WITH DIFFERENTIAL/PLATELET
Basophils Absolute: 0 10*3/uL (ref 0–0.1)
Basophils Relative: 0 %
Eosinophils Absolute: 0.1 10*3/uL (ref 0–0.7)
Eosinophils Relative: 1 %
HCT: 37.5 % (ref 35.0–47.0)
HEMOGLOBIN: 13.1 g/dL (ref 12.0–16.0)
LYMPHS ABS: 2.3 10*3/uL (ref 1.0–3.6)
LYMPHS PCT: 19 %
MCH: 31.5 pg (ref 26.0–34.0)
MCHC: 35 g/dL (ref 32.0–36.0)
MCV: 90 fL (ref 80.0–100.0)
MONOS PCT: 7 %
Monocytes Absolute: 0.8 10*3/uL (ref 0.2–0.9)
NEUTROS PCT: 73 %
Neutro Abs: 8.9 10*3/uL — ABNORMAL HIGH (ref 1.4–6.5)
Platelets: 219 10*3/uL (ref 150–440)
RBC: 4.16 MIL/uL (ref 3.80–5.20)
RDW: 12.7 % (ref 11.5–14.5)
WBC: 12.2 10*3/uL — AB (ref 3.6–11.0)

## 2017-11-08 LAB — URINALYSIS, COMPLETE (UACMP) WITH MICROSCOPIC
BILIRUBIN URINE: NEGATIVE
Bacteria, UA: NONE SEEN
GLUCOSE, UA: NEGATIVE mg/dL
Hgb urine dipstick: NEGATIVE
Ketones, ur: NEGATIVE mg/dL
Leukocytes, UA: NEGATIVE
NITRITE: NEGATIVE
PROTEIN: NEGATIVE mg/dL
Specific Gravity, Urine: 1.011 (ref 1.005–1.030)
pH: 6 (ref 5.0–8.0)

## 2017-11-08 LAB — HCG, QUANTITATIVE, PREGNANCY: hCG, Beta Chain, Quant, S: 100408 m[IU]/mL — ABNORMAL HIGH (ref <5)

## 2017-11-08 LAB — POCT PREGNANCY, URINE: Preg Test, Ur: POSITIVE — AB

## 2017-11-08 NOTE — ED Triage Notes (Addendum)
Patient ambulatory to triage with steady gait, without difficulty or distress noted; pt reports lower abd and back cramping x wk; denies any accomp symptoms; denies vag bleeding or discharge; st approx [redacted]wks pregnant, G1; has appt next wk at [facility]

## 2017-11-09 ENCOUNTER — Telehealth: Payer: Self-pay | Admitting: Emergency Medicine

## 2017-11-09 NOTE — Telephone Encounter (Signed)
Called patient due to lwot to inquire about condition and follow up plans. Left message.   

## 2017-11-18 ENCOUNTER — Other Ambulatory Visit (HOSPITAL_COMMUNITY)
Admission: RE | Admit: 2017-11-18 | Discharge: 2017-11-18 | Disposition: A | Discharge status: Home / Self Care | Payer: Medicaid Other | Source: Ambulatory Visit | Attending: Maternal Newborn | Admitting: Maternal Newborn

## 2017-11-18 ENCOUNTER — Ambulatory Visit (INDEPENDENT_AMBULATORY_CARE_PROVIDER_SITE_OTHER): Payer: Medicaid Other | Admitting: Maternal Newborn

## 2017-11-18 ENCOUNTER — Encounter: Payer: Self-pay | Admitting: Maternal Newborn

## 2017-11-18 VITALS — BP 120/70 | Wt 132.0 lb

## 2017-11-18 DIAGNOSIS — O9989 Other specified diseases and conditions complicating pregnancy, childbirth and the puerperium: Secondary | ICD-10-CM | POA: Diagnosis not present

## 2017-11-18 DIAGNOSIS — N898 Other specified noninflammatory disorders of vagina: Secondary | ICD-10-CM | POA: Diagnosis present

## 2017-11-18 DIAGNOSIS — Z0189 Encounter for other specified special examinations: Secondary | ICD-10-CM

## 2017-11-18 DIAGNOSIS — Z3A00 Weeks of gestation of pregnancy not specified: Secondary | ICD-10-CM | POA: Insufficient documentation

## 2017-11-18 DIAGNOSIS — Z3400 Encounter for supervision of normal first pregnancy, unspecified trimester: Secondary | ICD-10-CM

## 2017-11-18 DIAGNOSIS — O0990 Supervision of high risk pregnancy, unspecified, unspecified trimester: Secondary | ICD-10-CM | POA: Insufficient documentation

## 2017-11-18 DIAGNOSIS — Z113 Encounter for screening for infections with a predominantly sexual mode of transmission: Secondary | ICD-10-CM

## 2017-11-18 DIAGNOSIS — Z3A01 Less than 8 weeks gestation of pregnancy: Secondary | ICD-10-CM

## 2017-11-18 DIAGNOSIS — O26899 Other specified pregnancy related conditions, unspecified trimester: Secondary | ICD-10-CM | POA: Diagnosis not present

## 2017-11-18 DIAGNOSIS — N912 Amenorrhea, unspecified: Secondary | ICD-10-CM

## 2017-11-18 DIAGNOSIS — Z3201 Encounter for pregnancy test, result positive: Secondary | ICD-10-CM | POA: Diagnosis not present

## 2017-11-18 LAB — POCT URINALYSIS DIPSTICK OB
GLUCOSE, UA: NEGATIVE
POC,PROTEIN,UA: NEGATIVE

## 2017-11-18 LAB — POCT URINE PREGNANCY: Preg Test, Ur: POSITIVE — AB

## 2017-11-18 LAB — OB RESULTS CONSOLE VARICELLA ZOSTER ANTIBODY, IGG: Varicella: NON-IMMUNE/NOT IMMUNE

## 2017-11-18 NOTE — Progress Notes (Signed)
NOB- no concerns ?

## 2017-11-18 NOTE — Progress Notes (Signed)
11/18/2017   Chief Complaint: Amenorrhea, positive home pregnancy test, desires prenatal care.  Transfer of Care Patient: no  History of Present Illness: Tamara Flynn is a 22 y.o. G1P0000 at [redacted]w[redacted]d based on Patient's last menstrual period on 09/24/2017 (lmp approximate), with an Estimated Date of Delivery: 07/01/2018, with the above CC.   Her periods were: irregular periods.  She has Positive signs or symptoms of nausea of pregnancy. She has Negative signs or symptoms of miscarriage or preterm labor She identifies Negative Zika risk factors for her and her partner On any medications around the time she conceived/early pregnancy: Yes - promethazine for nausea, metronidazole gel for recurrent BV and inhaler for asthma History of varicella: No    Review of Systems  Constitutional: Negative.   HENT: Negative.   Eyes: Negative.   Respiratory: Negative for cough and shortness of breath.   Cardiovascular: Negative for chest pain and palpitations.  Gastrointestinal: Positive for nausea.  Endocrine: Negative.   Genitourinary: Positive for vaginal discharge.  Musculoskeletal: Negative.   Skin: Negative.   Allergic/Immunologic: Negative.   Neurological: Negative.   Hematological: Negative.   Psychiatric/Behavioral: Negative.   Breast ROS: breast tenderness  ROS: Review of systems was otherwise negative, except as stated in the above HPI.  OBGYN History: As per HPI. OB History  Gravida Para Term Preterm AB Living  1 0 0 0 0 0  SAB TAB Ectopic Multiple Live Births  0 0 0 0      # Outcome Date GA Lbr Len/2nd Weight Sex Delivery Anes PTL Lv  1 Current             Any issues with any prior pregnancies: not applicable Any prior children are healthy, doing well, without any problems or issues: not applicable History of pap smears: Yes. Last pap smear was 10/2017 at [facility] per patient report. History of STIs: remote history of chlamydia   Past Medical History: Past Medical  History:  Diagnosis Date  . Asthma     Past Surgical History: Past Surgical History:  Procedure Laterality Date  . NO PAST SURGERIES      Family History:  Family History  Problem Relation Age of Onset  . Epilepsy Mother   . Epilepsy Father    She denies any female cancers, bleeding or blood clotting disorders.  She reports a history of autism in her maternal aunt and nephew. No other history of intellectual disability, birth defects or genetic disorders in her or the FOB's history  Social History:  Social History   Socioeconomic History  . Marital status: Single    Spouse name: Not on file  . Number of children: Not on file  . Years of education: Not on file  . Highest education level: Not on file  Occupational History  . Not on file  Social Needs  . Financial resource strain: Not on file  . Food insecurity:    Worry: Not on file    Inability: Not on file  . Transportation needs:    Medical: Not on file    Non-medical: Not on file  Tobacco Use  . Smoking status: Never Smoker  . Smokeless tobacco: Never Used  Substance and Sexual Activity  . Alcohol use: No  . Drug use: No  . Sexual activity: Yes    Partners: Male  Lifestyle  . Physical activity:    Days per week: Not on file    Minutes per session: Not on file  . Stress: Not  on file  Relationships  . Social connections:    Talks on phone: Not on file    Gets together: Not on file    Attends religious service: Not on file    Active member of club or organization: Not on file    Attends meetings of clubs or organizations: Not on file    Relationship status: Not on file  . Intimate partner violence:    Fear of current or ex partner: Not on file    Emotionally abused: Not on file    Physically abused: Not on file    Forced sexual activity: Not on file  Other Topics Concern  . Not on file  Social History Narrative  . Not on file   Any cats in the household: no No history of or current problems with  domestic violence.  Allergy: Allergies  Allergen Reactions  . Ciprocin-Fluocin-Procin [Fluocinolone]     Current Outpatient Medications:  Current Outpatient Medications:  .  FLOVENT HFA 220 MCG/ACT inhaler, INHALE 1 PUFF WITH SPACER TWICE A DAY FOR ASTHMA, Disp: , Rfl: 6 .  metroNIDAZOLE (METROGEL) 0.75 % vaginal gel, INSERT 1 APPLICATORFUL VAGINALLY AT BEDTIME 2 TIMES PER WEEK FOR 4 MONTHS FOR PREVENTION OF PV, Disp: , Rfl: 3 .  PROAIR HFA 108 (90 Base) MCG/ACT inhaler, INHALE 2 PUFFS BY MOUTH EVERY 4 TO 6 HOURS AS NEEDED FOR WHEEZING, COUGH OR SHORTNESS OF BREATH, Disp: , Rfl: 6 .  promethazine (PHENERGAN) 25 MG tablet, TAKE 1/2 TO 1 TABLET BY MOUTH EVERY 6 HOURS AS NEEDED FOR NAUSEA OR VOMITING, Disp: , Rfl: 0   Physical Exam:   BP 120/70   Wt 132 lb (59.9 kg)   LMP 09/24/2017 (LMP Unknown)   BMI 26.66 kg/m  Body mass index is 26.66 kg/m. Constitutional: Well nourished, well developed female in no acute distress.  Neck:  Supple, normal appearance, and no thyromegaly  Cardiovascular: S1, S2 normal, no murmur, rub or gallop, regular rate and rhythm Respiratory:  Clear to auscultation bilaterally. Normal respiratory effort Abdomen: no masses, hernias; diffusely non tender to palpation, non distended Breasts:  patient declines to have breast exam. Neuro/Psych:  Normal mood and affect.  Skin:  Warm and dry.  Lymphatic:  No inguinal lymphadenopathy.   Pelvic exam declined after shared decision making External genitalia, Bartholin's glands, Urethra, Skene's glands: within normal limits Vagina: within normal limits and with no blood in the vault, small amount of white discharge   Assessment: Tamara Flynn is a 22 y.o. G1P0000 at [redacted]w[redacted]d based on Patient's last menstrual period on 09/24/2017 (lmp approximate), with an Estimated Date of Delivery: 07/01/18, presenting for prenatal care.  Plan:  1) Avoid alcoholic beverages. 2) Patient encouraged not to smoke.  3) Discontinue the use of  all non-medicinal drugs and chemicals.  4) Take prenatal vitamins daily.  5) Seatbelt use advised 6) Nutrition, food safety (fish, cheese advisories, and high nitrite foods) and exercise discussed. 7) Hospital and practice style delivering at [facility] discussed  8) Patient is asked about travel to areas at risk for the [place] virus, and counseled to avoid travel and exposure to mosquitoes or sexual partners who may have themselves been exposed to the virus. Testing is discussed, and will be ordered as appropriate.  9) Childbirth classes at [facility] advised 10) Genetic Screening, such as with 1st Trimester Screening, cell free fetal DNA, AFP testing, and Ultrasound, as well as with amniocentesis and CVS as appropriate, is discussed with patient. She plans to have  genetic testing this pregnancy. 11) Dating scan ordered.  Problem list reviewed and updated.  Return in about 1 week (around 11/25/2017) for ROB and dating scan.  Avel Sensor, CNM [facility], [facility] 11/18/2017  10:26 AM

## 2017-11-19 LAB — URINE DRUG PANEL 7
Amphetamines, Urine: NEGATIVE ng/mL
Barbiturate Quant, Ur: NEGATIVE ng/mL
Benzodiazepine Quant, Ur: NEGATIVE ng/mL
Cannabinoid Quant, Ur: NEGATIVE ng/mL
Cocaine (Metab.): NEGATIVE ng/mL
OPIATE QUANT UR: NEGATIVE ng/mL
PCP QUANT UR: NEGATIVE ng/mL

## 2017-11-20 LAB — URINE CULTURE: Organism ID, Bacteria: NO GROWTH

## 2017-11-21 LAB — RPR+RH+ABO+RUB AB+AB SCR+CB...
Antibody Screen: NEGATIVE
HEMATOCRIT: 40.1 % (ref 34.0–46.6)
HEMOGLOBIN: 13.5 g/dL (ref 11.1–15.9)
HEP B S AG: NEGATIVE
HIV Screen 4th Generation wRfx: NONREACTIVE
MCH: 29.9 pg (ref 26.6–33.0)
MCHC: 33.7 g/dL (ref 31.5–35.7)
MCV: 89 fL (ref 79–97)
Platelets: 252 10*3/uL (ref 150–450)
RBC: 4.52 x10E6/uL (ref 3.77–5.28)
RDW: 12.2 % — AB (ref 12.3–15.4)
RH TYPE: POSITIVE
RPR Ser Ql: NONREACTIVE
Rubella Antibodies, IGG: <0.9 index — ABNORMAL LOW (ref >0.99)
Varicella zoster IgG: <135 index — ABNORMAL LOW (ref >165)
WBC: 9.8 10*3/uL (ref 3.4–10.8)

## 2017-11-21 LAB — HEMOGLOBINOPATHY EVALUATION
HGB A: 97.7 % (ref 96.4–98.8)
HGB C: 0 %
HGB S: 0 %
HGB VARIANT: 0 %
Hemoglobin A2 Quantitation: 2.3 % (ref 1.8–3.2)
Hemoglobin F Quantitation: 0 % (ref 0.0–2.0)

## 2017-11-22 LAB — CERVICOVAGINAL ANCILLARY ONLY
CHLAMYDIA, DNA PROBE: NEGATIVE
Candida vaginitis: NEGATIVE
NEISSERIA GONORRHEA: NEGATIVE

## 2017-11-25 ENCOUNTER — Encounter: Payer: Self-pay | Admitting: Maternal Newborn

## 2017-11-28 ENCOUNTER — Encounter: Payer: Self-pay | Admitting: Obstetrics and Gynecology

## 2017-11-28 ENCOUNTER — Ambulatory Visit (INDEPENDENT_AMBULATORY_CARE_PROVIDER_SITE_OTHER): Payer: Medicaid Other | Admitting: Obstetrics and Gynecology

## 2017-11-28 ENCOUNTER — Ambulatory Visit (INDEPENDENT_AMBULATORY_CARE_PROVIDER_SITE_OTHER): Payer: Medicaid Other

## 2017-11-28 ENCOUNTER — Telehealth: Payer: Self-pay | Admitting: Obstetrics and Gynecology

## 2017-11-28 VITALS — BP 112/62 | Wt 130.0 lb

## 2017-11-28 DIAGNOSIS — Z3401 Encounter for supervision of normal first pregnancy, first trimester: Secondary | ICD-10-CM

## 2017-11-28 DIAGNOSIS — Z3400 Encounter for supervision of normal first pregnancy, unspecified trimester: Secondary | ICD-10-CM

## 2017-11-28 DIAGNOSIS — O3680X0 Pregnancy with inconclusive fetal viability, not applicable or unspecified: Secondary | ICD-10-CM

## 2017-11-28 DIAGNOSIS — Z3A09 9 weeks gestation of pregnancy: Secondary | ICD-10-CM | POA: Diagnosis not present

## 2017-11-28 NOTE — Telephone Encounter (Signed)
Patient is requesting a Dr. Note for Work missed and for the next 48 hours due to stomach bug symptoms, Diarrhea and vomiting. Please advise

## 2017-11-28 NOTE — Telephone Encounter (Signed)
Pt aware.  States she needs one for school too.

## 2017-11-28 NOTE — Telephone Encounter (Signed)
Done and up front

## 2017-11-28 NOTE — Telephone Encounter (Signed)
Letter has been written and is waiting up front for her

## 2017-11-28 NOTE — Progress Notes (Signed)
ROB/Dating C/o Nausea all day, some cramping no spotting  Declines flu shot

## 2017-11-28 NOTE — Progress Notes (Signed)
    Routine Prenatal Care Visit  Subjective  Tamara Flynn is a 22 y.o. G1P0000 at [redacted]w[redacted]d being seen today for ongoing prenatal care.  She is currently monitored for the following issues for this low-risk pregnancy and has Supervision of normal first pregnancy, antepartum on their problem list.  ----------------------------------------------------------------------------------- Patient reports no complaints.   Contractions: Not present. Vag. Bleeding: None.  Movement: Absent. Denies leaking of fluid.  ----------------------------------------------------------------------------------- The following portions of the patient's history were reviewed and updated as appropriate: allergies, current medications, past family history, past medical history, past social history, past surgical history and problem list. Problem list updated.   Objective  Blood pressure 112/62, weight 130 lb (59 kg), last menstrual period 09/24/2017. Pregravid weight 127 lb (57.6 kg) Total Weight Gain 3 lb (1.361 kg) Urinalysis:      Fetal Status: Fetal Heart Rate (bpm): 184   Movement: Absent     General:  Alert, oriented and cooperative. Patient is in no acute distress.  Skin: Skin is warm and dry. No rash noted.   Cardiovascular: Normal heart rate noted  Respiratory: Normal respiratory effort, no problems with respiration noted  Abdomen: Soft, gravid, appropriate for gestational age. Pain/Pressure: Absent     Pelvic:  Cervical exam deferred        Extremities: Normal range of motion.     ental Status: Normal mood and affect. Normal behavior. Normal judgment and thought content.     Assessment   22 y.o. G1P0000 at [redacted]w[redacted]d by  07/01/2018, by Last Menstrual Period presenting for routine prenatal visit  Plan   FIRST Problems (from 11/18/17 to present)    Problem Noted Resolved   Supervision of normal first pregnancy, antepartum 11/18/2017 by Rexene Agent, CNM No   Overview Addendum 11/25/2017  3:16 PM by  Rexene Agent, [place] Prenatal Labs  Dating  Blood type: B/Positive/-- (09/13 1055)   Genetic Screen 1 Screen:    AFP:     Quad:     NIPS: Antibody:Negative (09/13 1055)  Anatomic [place]  Rubella: <0.90 (09/13 1055) Varicella: Non-immune  GTT Early:               Third trimester:  RPR: Non Reactive (09/13 1055)   Rhogam  HBsAg: Negative (09/13 1055)   TDaP vaccine                       Flu Shot: HIV: Non Reactive (09/13 1055)   Baby Food                                GBS:   Contraception  Pap: 10/2017, NILM per patient report  CBB     CS/VBAC    Support Person                  Gestational age appropriate obstetric precautions including but not limited to vaginal bleeding, contractions, leaking of fluid and fetal movement were reviewed in detail with the patient.    Discussed genetic screening options- desires [id number].  Discussed that she is nonimmune to rubella and varicella and to avoid people that may have those illnesses.  Given information on prenatal classes.  Declined flu shot Return in about 2 weeks (around 12/12/2017) for ROB.  Adrian Prows MD [facility], [facility] 11/28/17 10:59 AM

## 2017-12-13 ENCOUNTER — Other Ambulatory Visit: Payer: Self-pay | Admitting: Advanced Practice Midwife

## 2017-12-13 DIAGNOSIS — O219 Vomiting of pregnancy, unspecified: Secondary | ICD-10-CM

## 2017-12-13 MED ORDER — DOXYLAMINE-PYRIDOXINE 10-10 MG PO TBEC: 2.0000 | DELAYED_RELEASE_TABLET | Freq: Every day | ORAL | 2 refills | Status: DC

## 2017-12-13 NOTE — Progress Notes (Signed)
Diclegis Rx sent to patient pharmacy per her request.

## 2017-12-19 ENCOUNTER — Encounter: Payer: Self-pay | Admitting: Obstetrics and Gynecology

## 2017-12-19 ENCOUNTER — Ambulatory Visit (INDEPENDENT_AMBULATORY_CARE_PROVIDER_SITE_OTHER): Payer: Medicaid Other | Admitting: Obstetrics and Gynecology

## 2017-12-19 VITALS — BP 118/74 | Wt 132.0 lb

## 2017-12-19 DIAGNOSIS — Z3A12 12 weeks gestation of pregnancy: Secondary | ICD-10-CM

## 2017-12-19 DIAGNOSIS — Z3400 Encounter for supervision of normal first pregnancy, unspecified trimester: Secondary | ICD-10-CM

## 2017-12-19 DIAGNOSIS — Z1379 Encounter for other screening for genetic and chromosomal anomalies: Secondary | ICD-10-CM

## 2017-12-19 DIAGNOSIS — Z3401 Encounter for supervision of normal first pregnancy, first trimester: Secondary | ICD-10-CM

## 2017-12-19 NOTE — Progress Notes (Signed)
  Routine Prenatal Care Visit  Subjective  Tamara Flynn is a 22 y.o. G1P0000 at [redacted]w[redacted]d being seen today for ongoing prenatal care.  She is currently monitored for the following issues for this low-risk pregnancy and has Supervision of normal first pregnancy, antepartum on their problem list.  ----------------------------------------------------------------------------------- Patient reports no complaints.   Contractions: Not present. Vag. Bleeding: None.  Movement: Absent. Denies leaking of fluid.  ----------------------------------------------------------------------------------- The following portions of the patient's history were reviewed and updated as appropriate: allergies, current medications, past family history, past medical history, past social history, past surgical history and problem list. Problem list updated.   Objective  Blood pressure 118/74, weight 132 lb (59.9 kg), last menstrual period 09/24/2017. Pregravid weight 127 lb (57.6 kg) Total Weight Gain 5 lb (2.268 kg) Urinalysis: Urine Protein    Urine Glucose    Fetal Status: Fetal Heart Rate (bpm): 160   Movement: Absent     General:  Alert, oriented and cooperative. Patient is in no acute distress.  Skin: Skin is warm and dry. No rash noted.   Cardiovascular: Normal heart rate noted  Respiratory: Normal respiratory effort, no problems with respiration noted  Abdomen: Soft, gravid, appropriate for gestational age. Pain/Pressure: Absent     Pelvic:  Cervical exam deferred        Extremities: Normal range of motion.     Mental Status: Normal mood and affect. Normal behavior. Normal judgment and thought content.   Assessment   22 y.o. G1P0000 at [redacted]w[redacted]d by  07/01/2018, by Last Menstrual Period presenting for routine prenatal visit  Plan   FIRST Problems (from 11/18/17 to present)    Problem Noted Resolved   Supervision of normal first pregnancy, antepartum 11/18/2017 by Rexene Agent, CNM No   Overview Addendum  11/28/2017 11:01 AM by Homero Fellers, MD    [facility] Prenatal Labs  Dating  LMP=9wk [place] Blood type: B/Positive/-- (09/13 1055)   Genetic Screen 1 Screen:    AFP:     Quad:     NIPS: Antibody:Negative (09/13 1055)  Anatomic [place]  Rubella: <0.90 (09/13 1055) Varicella: Non-immune  GTT Early:               Third trimester:  RPR: Non Reactive (09/13 1055)   Rhogam  not needed HBsAg: Negative (09/13 1055)   TDaP vaccine                        Flu Shot: Declines HIV: Non Reactive (09/13 1055)   Baby Food                                GBS:   Contraception  Pap: 10/2017, NILM per patient report  CBB     CS/VBAC    Support Person                  Preterm labor symptoms and general obstetric precautions including but not limited to vaginal bleeding, contractions, leaking of fluid and fetal movement were reviewed in detail with the patient. Please refer to After Visit Summary for other counseling recommendations.   - NIPT today - SMA/Frag X/CF testing today.  Return in about 4 weeks (around 01/16/2018) for Routine Prenatal Appointment.  Prentice Docker, MD, [facility], [facility] 12/19/2017 3:27 PM

## 2018-01-16 ENCOUNTER — Encounter: Payer: Medicaid Other | Admitting: Obstetrics & Gynecology

## 2018-02-08 ENCOUNTER — Encounter: Payer: Medicaid Other | Admitting: Obstetrics and Gynecology

## 2018-02-13 ENCOUNTER — Encounter: Payer: Self-pay | Admitting: Maternal Newborn

## 2018-02-13 ENCOUNTER — Ambulatory Visit (INDEPENDENT_AMBULATORY_CARE_PROVIDER_SITE_OTHER): Payer: Medicaid Other | Admitting: Maternal Newborn

## 2018-02-13 VITALS — BP 138/78 | Wt 143.0 lb

## 2018-02-13 DIAGNOSIS — Z3A20 20 weeks gestation of pregnancy: Secondary | ICD-10-CM

## 2018-02-13 DIAGNOSIS — Z3689 Encounter for other specified antenatal screening: Secondary | ICD-10-CM

## 2018-02-13 DIAGNOSIS — Z3402 Encounter for supervision of normal first pregnancy, second trimester: Secondary | ICD-10-CM

## 2018-02-13 DIAGNOSIS — Z3400 Encounter for supervision of normal first pregnancy, unspecified trimester: Secondary | ICD-10-CM

## 2018-02-13 LAB — POCT URINALYSIS DIPSTICK OB
Glucose, UA: NEGATIVE
PROTEIN: NEGATIVE

## 2018-02-13 NOTE — Patient Instructions (Signed)

## 2018-02-13 NOTE — Progress Notes (Signed)
Routine Prenatal Care Visit  Subjective  Tamara Flynn is a 22 y.o. G1P0000 at [redacted]w[redacted]d being seen today for ongoing prenatal care.  She is currently monitored for the following issues for this low-risk pregnancy and has Supervision of normal first pregnancy, antepartum on their problem list.  ----------------------------------------------------------------------------------- Patient reports nasal congestion, nosebleeds, nausea in the afternoon, skin pigment changes.  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 Vag. Bleeding: None.  Movement: Present. No leaking of fluid.  ----------------------------------------------------------------------------------- The following portions of the patient's history were reviewed and updated as appropriate: allergies, current medications, past family history, past medical history, past social history, past surgical history and problem list. Problem list updated.  Objective  Blood pressure 138/78, weight 143 lb (64.9 kg), last menstrual period 09/24/2017. Pregravid weight 127 lb (57.6 kg) Total Weight Gain 16 lb (7.258 kg) Body mass index is 28.88 kg/m.   Urinalysis: Protein Negative, Glucose Negative  Fetal Status: Fetal Heart Rate (bpm): 150 Fundal Height: 21 cm Movement: Present     General:  Alert, oriented and cooperative. Patient is in no acute distress.  Skin: Skin is warm and dry. No rash noted.   Cardiovascular: Normal heart rate noted  Respiratory: Normal respiratory effort, no problems with respiration noted  Abdomen: Soft, gravid, appropriate for gestational age. Pain/Pressure: Present     Pelvic:  Cervical exam deferred        Extremities: Normal range of motion.  Edema: None  Mental Status: Normal mood and affect. Normal behavior. Normal judgment and thought content.     Assessment   22 y.o. G1P0000 at [redacted]w[redacted]d, EDD 07/01/2018 by Last Menstrual Period presenting for a routine prenatal visit.  Plan     FIRST Problems (from 11/18/17 to present)    Problem Noted Resolved   Supervision of normal first pregnancy, antepartum 11/18/2017 by Rexene Agent, CNM No   Overview Addendum 11/28/2017 11:01 AM by Homero Fellers, MD    [facility] Prenatal Labs  Dating  LMP=9wk [place] Blood type: B/Positive/-- (09/13 1055)   Genetic Screen 1 Screen:    AFP:     Quad:     NIPS: Antibody:Negative (09/13 1055)  Anatomic [place]  Rubella: <0.90 (09/13 1055) Varicella: Non-immune  GTT Early:               Third trimester:  RPR: Non Reactive (09/13 1055)   Rhogam  not needed HBsAg: Negative (09/13 1055)   TDaP vaccine                        Flu Shot: Declines HIV: Non Reactive (09/13 1055)   Baby Food                                GBS:   Contraception  Pap: 10/2017, NILM per patient report  CBB     CS/VBAC    Support Person                 First prenatal visit since 12/19/2017.  Discussed common discomforts of pregnancy that she is experiencing. Talked about employer accomodations for pregnancy and offered to write letter with lifting restrictions, but she feels that this would not be observed at her workplace.  Please refer to After Visit Summary for other counseling recommendations.   Return in about 1 week (around 02/20/2018) for [place] with  anatomy scan.  Avel Sensor, CNM 02/13/2018  2:47 PM

## 2018-02-17 NOTE — Telephone Encounter (Signed)
You have seen this patient recently, I have not.

## 2018-02-22 ENCOUNTER — Encounter: Payer: Medicaid Other | Admitting: Advanced Practice Midwife

## 2018-02-22 ENCOUNTER — Other Ambulatory Visit: Payer: Medicaid Other

## 2018-02-23 ENCOUNTER — Ambulatory Visit (INDEPENDENT_AMBULATORY_CARE_PROVIDER_SITE_OTHER): Payer: Medicaid Other | Admitting: Maternal Newborn

## 2018-02-23 ENCOUNTER — Encounter: Payer: Self-pay | Admitting: Maternal Newborn

## 2018-02-23 ENCOUNTER — Ambulatory Visit (INDEPENDENT_AMBULATORY_CARE_PROVIDER_SITE_OTHER): Payer: Medicaid Other

## 2018-02-23 VITALS — BP 110/68 | Wt 146.0 lb

## 2018-02-23 DIAGNOSIS — Z3A21 21 weeks gestation of pregnancy: Secondary | ICD-10-CM

## 2018-02-23 DIAGNOSIS — Z369 Encounter for antenatal screening, unspecified: Secondary | ICD-10-CM

## 2018-02-23 DIAGNOSIS — Z363 Encounter for antenatal screening for malformations: Secondary | ICD-10-CM | POA: Diagnosis not present

## 2018-02-23 DIAGNOSIS — Z3400 Encounter for supervision of normal first pregnancy, unspecified trimester: Secondary | ICD-10-CM

## 2018-02-23 DIAGNOSIS — Z3402 Encounter for supervision of normal first pregnancy, second trimester: Secondary | ICD-10-CM

## 2018-02-23 NOTE — Progress Notes (Signed)
    Routine Prenatal Care Visit  Subjective  Tamara Flynn is a 22 y.o. G1P0000 at [redacted]w[redacted]d being seen today for ongoing prenatal care.  She is currently monitored for the following issues for this low-risk pregnancy and has Supervision of normal first pregnancy, antepartum on their problem list.  ----------------------------------------------------------------------------------- Patient reports some edema in her feet.   Contractions: Not present. Vag. Bleeding: None.  Movement: Present. No leaking of fluid.  ----------------------------------------------------------------------------------- The following portions of the patient's history were reviewed and updated as appropriate: allergies, current medications, past family history, past medical history, past social history, past surgical history and problem list. Problem list updated.   Objective  Blood pressure 110/68, weight 146 lb (66.2 kg), last menstrual period 09/24/2017. Pregravid weight 127 lb (57.6 kg) Total Weight Gain 19 lb (8.618 kg)  Fetal Status: Fetal Heart Rate (bpm): 144 Fundal Height: 22 cm Movement: Present     General:  Alert, oriented and cooperative. Patient is in no acute distress.  Skin: Skin is warm and dry. No rash noted.   Cardiovascular: Normal heart rate noted  Respiratory: Normal respiratory effort, no problems with respiration noted  Abdomen: Soft, gravid, appropriate for gestational age. Pain/Pressure: Present     Pelvic:  Cervical exam deferred        Extremities: Normal range of motion.     Mental Status: Normal mood and affect. Normal behavior. Normal judgment and thought content.     Assessment   22 y.o. G1P0000 at [redacted]w[redacted]d, EDD 07/01/2018 by Last Menstrual Period presenting for a routine prenatal visit.  Plan   FIRST Problems (from 11/18/17 to present)    Problem Noted Resolved   Supervision of normal first pregnancy, antepartum 11/18/2017 by Rexene Agent, CNM No   Overview Addendum  11/28/2017 11:01 AM by Homero Fellers, MD    [facility] Prenatal Labs  Dating  LMP=9wk [place] Blood type: B/Positive/-- (09/13 1055)   Genetic Screen 1 Screen:    AFP:     Quad:     NIPS: Antibody:Negative (09/13 1055)  Anatomic [place]  Rubella: <0.90 (09/13 1055) Varicella: Non-immune  GTT Early:               Third trimester:  RPR: Non Reactive (09/13 1055)   Rhogam  not needed HBsAg: Negative (09/13 1055)   TDaP vaccine                        Flu Shot: Declines HIV: Non Reactive (09/13 1055)   Baby Food                                GBS:   Contraception  Pap: 10/2017, NILM per patient report  CBB     CS/VBAC    Support Person               Anatomy scan incomplete today for spine, otherwise normal anatomy.   Discussed comfort measures for edema such as elevation and compression stockings.  Note given for lifting restrictions in pregnancy.  Please refer to After Visit Summary for other counseling recommendations.   Return in about 4 weeks (around 03/23/2018) for ROB and follow up anatomy scan.  Avel Sensor, CNM 02/23/2018  3:42 PM

## 2018-02-23 NOTE — Patient Instructions (Signed)

## 2018-02-23 NOTE — Progress Notes (Signed)
Anatomy scan today. No complaints. 

## 2018-03-13 ENCOUNTER — Other Ambulatory Visit: Payer: Self-pay

## 2018-03-13 ENCOUNTER — Observation Stay
Admission: EM | Admit: 2018-03-13 | Discharge: 2018-03-13 | Disposition: A | Discharge status: Home / Self Care | Payer: Medicaid Other | Attending: Obstetrics and Gynecology | Admitting: Obstetrics and Gynecology

## 2018-03-13 DIAGNOSIS — R109 Unspecified abdominal pain: Secondary | ICD-10-CM

## 2018-03-13 DIAGNOSIS — O26892 Other specified pregnancy related conditions, second trimester: Principal | ICD-10-CM

## 2018-03-13 DIAGNOSIS — Z79899 Other long term (current) drug therapy: Secondary | ICD-10-CM | POA: Diagnosis not present

## 2018-03-13 DIAGNOSIS — R1033 Periumbilical pain: Secondary | ICD-10-CM | POA: Diagnosis not present

## 2018-03-13 DIAGNOSIS — R1031 Right lower quadrant pain: Secondary | ICD-10-CM | POA: Diagnosis not present

## 2018-03-13 DIAGNOSIS — J45909 Unspecified asthma, uncomplicated: Secondary | ICD-10-CM | POA: Diagnosis not present

## 2018-03-13 DIAGNOSIS — Z888 Allergy status to other drugs, medicaments and biological substances status: Secondary | ICD-10-CM | POA: Diagnosis not present

## 2018-03-13 DIAGNOSIS — R1032 Left lower quadrant pain: Secondary | ICD-10-CM | POA: Diagnosis not present

## 2018-03-13 DIAGNOSIS — R1011 Right upper quadrant pain: Secondary | ICD-10-CM | POA: Insufficient documentation

## 2018-03-13 DIAGNOSIS — Z3400 Encounter for supervision of normal first pregnancy, unspecified trimester: Secondary | ICD-10-CM

## 2018-03-13 DIAGNOSIS — O99512 Diseases of the respiratory system complicating pregnancy, second trimester: Secondary | ICD-10-CM | POA: Insufficient documentation

## 2018-03-13 DIAGNOSIS — Z3A24 24 weeks gestation of pregnancy: Secondary | ICD-10-CM | POA: Diagnosis not present

## 2018-03-13 HISTORY — DX: Other specified pregnancy related conditions, second trimester: R10.9

## 2018-03-13 HISTORY — DX: Other specified pregnancy related conditions, second trimester: O26.892

## 2018-03-13 LAB — URINALYSIS, COMPLETE (UACMP) WITH MICROSCOPIC
Bilirubin Urine: NEGATIVE
Glucose, UA: NEGATIVE mg/dL
Hgb urine dipstick: NEGATIVE
KETONES UR: NEGATIVE mg/dL
Leukocytes, UA: NEGATIVE
Nitrite: NEGATIVE
Protein, ur: NEGATIVE mg/dL
Specific Gravity, Urine: 1.013 (ref 1.005–1.030)
pH: 7 (ref 5.0–8.0)

## 2018-03-13 LAB — CBC WITH DIFFERENTIAL/PLATELET
Abs Immature Granulocytes: 0.12 10*3/uL — ABNORMAL HIGH (ref 0.00–0.07)
Basophils Absolute: 0 10*3/uL (ref 0.0–0.1)
Basophils Relative: 0 %
Eosinophils Absolute: 0.1 10*3/uL (ref 0.0–0.5)
Eosinophils Relative: 1 %
HCT: 36.3 % (ref 36.0–46.0)
Hemoglobin: 12.3 g/dL (ref 12.0–15.0)
Immature Granulocytes: 1 %
Lymphocytes Relative: 7 %
Lymphs Abs: 0.9 10*3/uL (ref 0.7–4.0)
MCH: 30.6 pg (ref 26.0–34.0)
MCHC: 33.9 g/dL (ref 30.0–36.0)
MCV: 90.3 fL (ref 80.0–100.0)
Monocytes Absolute: 0.4 10*3/uL (ref 0.1–1.0)
Monocytes Relative: 3 %
Neutro Abs: 11.5 10*3/uL — ABNORMAL HIGH (ref 1.7–7.7)
Neutrophils Relative %: 88 %
Platelets: 186 10*3/uL (ref 150–400)
RBC: 4.02 MIL/uL (ref 3.87–5.11)
RDW: 12.5 % (ref 11.5–15.5)
WBC: 13 10*3/uL — ABNORMAL HIGH (ref 4.0–10.5)
nRBC: 0 % (ref 0.0–0.2)

## 2018-03-13 LAB — COMPREHENSIVE METABOLIC PANEL
ALT: 16 U/L (ref 0–44)
AST: 18 U/L (ref 15–41)
Albumin: 3.4 g/dL — ABNORMAL LOW (ref 3.5–5.0)
Alkaline Phosphatase: 140 U/L — ABNORMAL HIGH (ref 38–126)
Anion gap: 9 (ref 5–15)
BUN: 6 mg/dL (ref 6–20)
CALCIUM: 8.8 mg/dL — AB (ref 8.9–10.3)
CO2: 20 mmol/L — ABNORMAL LOW (ref 22–32)
CREATININE: 0.53 mg/dL (ref 0.44–1.00)
Chloride: 106 mmol/L (ref 98–111)
GFR calc non Af Amer: >60 mL/min (ref >60)
Glucose, Bld: 81 mg/dL (ref 70–99)
Potassium: 3.7 mmol/L (ref 3.5–5.1)
Sodium: 135 mmol/L (ref 135–145)
Total Bilirubin: 0.7 mg/dL (ref 0.3–1.2)
Total Protein: 6.8 g/dL (ref 6.5–8.1)

## 2018-03-13 NOTE — Discharge Summary (Addendum)
Physician Final Progress Note  Patient ID: Tamara Flynn MRN: [id number] DOB/AGE: April 11, 1995 23 y.o.  Admit date: 03/13/2018 Admitting provider: Will Bonnet, MD Discharge date: 03/13/2018   Admission Diagnoses: abdominal pain, leaking fluid  Discharge Diagnoses:  Active Problems:   Indication for care in labor and delivery, antepartum   Abdominal pain during pregnancy in second trimester IUP at 24 weeks Positive fetal heart tones Membranes intact Round ligament pain  History of Present Illness: The patient is a 23 y.o. female G1P0000 at 106w2d who presents for pain in her abdomen since Thursday. There are 3 specific places she is feeling pain- upper right quadrant, umbilical and lower abdominal bilateral. The pain has been present for parts of each of the last 5 days and has been more constant today. Also for the past couple days she feels like following urination, fluid is still coming out. She denies burning or frequency of urination. She has not paid attention to the color of the fluid. She has noticed that her finger nails have been turning yellow in the past week. She last used nail polish about a month ago.   She admits positive fetal movement. She denies contractions. She denies vaginal bleeding.  Discussion of normal discomforts of pregnancy and comfort measures.  Past Medical History:  Diagnosis Date  . Asthma     Past Surgical History:  Procedure Laterality Date  . NO PAST SURGERIES      No current facility-administered medications on file prior to encounter.    Current Outpatient Medications on File Prior to Encounter  Medication Sig Dispense Refill  . Doxylamine-Pyridoxine (DICLEGIS) 10-10 MG TBEC Take 2 tablets by mouth at bedtime. If symptoms persist, add one tablet in the morning and one in the afternoon 120 tablet 2  . FLOVENT HFA 220 MCG/ACT inhaler INHALE 1 PUFF WITH SPACER TWICE A DAY FOR ASTHMA  6  . Prenatal Vit-Fe Fumarate-FA (PRENATAL MULTIVITAMIN)  TABS tablet Take 1 tablet by mouth daily at 12 noon.    Marland Kitchen PROAIR HFA 108 (90 Base) MCG/ACT inhaler INHALE 2 PUFFS BY MOUTH EVERY 4 TO 6 HOURS AS NEEDED FOR WHEEZING, COUGH OR SHORTNESS OF BREATH  6  . metroNIDAZOLE (METROGEL) 0.75 % vaginal gel INSERT 1 APPLICATORFUL VAGINALLY AT BEDTIME 2 TIMES PER WEEK FOR 4 MONTHS FOR PREVENTION OF PV  3  . promethazine (PHENERGAN) 25 MG tablet TAKE 1/2 TO 1 TABLET BY MOUTH EVERY 6 HOURS AS NEEDED FOR NAUSEA OR VOMITING  0    Allergies  Allergen Reactions  . Ciprocin-Fluocin-Procin [Fluocinolone]     Social History   Socioeconomic History  . Marital status: Single    Spouse name: Not on file  . Number of children: Not on file  . Years of education: Not on file  . Highest education level: Not on file  Occupational History  . Not on file  Social Needs  . Financial resource strain: Not on file  . Food insecurity:    Worry: Not on file    Inability: Not on file  . Transportation needs:    Medical: Not on file    Non-medical: Not on file  Tobacco Use  . Smoking status: Never Smoker  . Smokeless tobacco: Never Used  Substance and Sexual Activity  . Alcohol use: No  . Drug use: No  . Sexual activity: Yes    Partners: Male  Lifestyle  . Physical activity:    Days per week: Not on file    Minutes per  session: Not on file  . Stress: Not on file  Relationships  . Social connections:    Talks on phone: Not on file    Gets together: Not on file    Attends religious service: Not on file    Active member of club or organization: Not on file    Attends meetings of clubs or organizations: Not on file    Relationship status: Not on file  . Intimate partner violence:    Fear of current or ex partner: Not on file    Emotionally abused: Not on file    Physically abused: Not on file    Forced sexual activity: Not on file  Other Topics Concern  . Not on file  Social History Narrative  . Not on file    Family History  Problem Relation Age of  Onset  . Epilepsy Mother   . Epilepsy Father      Review of Systems  Constitutional: Negative.   HENT: Negative.   Eyes: Negative.   Respiratory: Negative.   Cardiovascular: Negative.   Gastrointestinal: Positive for abdominal pain.  Genitourinary: Negative.   Musculoskeletal: Negative.   Skin:       Fingernail beds yellow  Neurological: Negative.   Endo/Heme/Allergies: Negative.   Psychiatric/Behavioral: Negative.      Physical Exam: BP 115/67 (BP Location: Left Arm)   Pulse (!) 118   Temp 98.3 F (36.8 C) (Oral)   Resp 18   LMP 09/24/2017 (LMP Unknown)   Constitutional: Well nourished, well developed female in no acute distress.  HEENT: normal Skin: Warm and dry. Fingernail beds with underlying pink/orange color Cardiovascular: Regular rate and rhythm.   Extremity: no edema  Respiratory: Clear to auscultation bilateral. Normal respiratory effort Abdomen: FHT present, mild to palpation, mild tenderness around umbilicus and otherwise non-tender Back: no CVAT Neuro: DTRs 2+, Cranial nerves grossly intact Psych: Alert and Oriented x3. No memory deficits. Normal mood and affect.  MS: normal gait, normal bilateral lower extremity ROM/strength/stability.  Pelvic exam:  is not limited by body habitus EGBUS: within normal limits Vagina: within normal limits and with normal mucosa, thin white discharge  Cervix: sterile speculum exam: normal appearance, closed, negative pooling, negative fern  Toco: negative Fetal well being: on monitor for short time due to fetal age, 155 bpm baseline, moderate variability, no concerning heart rate variables  Consults: None  Significant Findings/ Diagnostic Studies: labs:   Results for Tamara, Flynn (MRN [id number]) as of 03/13/2018 18:26  Ref. Range 03/13/2018 16:29 03/13/2018 17:07  COMPREHENSIVE METABOLIC PANEL Unknown  Rpt (A)  Sodium Latest Ref Range: 135 - 145 mmol/L  135  Potassium Latest Ref Range: 3.5 - 5.1 mmol/L  3.7   Chloride Latest Ref Range: 98 - 111 mmol/L  106  CO2 Latest Ref Range: 22 - 32 mmol/L  20 (L)  Glucose Latest Ref Range: 70 - 99 mg/dL  81  BUN Latest Ref Range: 6 - 20 mg/dL  6  Creatinine Latest Ref Range: 0.44 - 1.00 mg/dL  0.53  Calcium Latest Ref Range: 8.9 - 10.3 mg/dL  8.8 (L)  Anion gap Latest Ref Range: 5 - 15   9  Alkaline Phosphatase Latest Ref Range: 38 - 126 U/L  140 (H)  Albumin Latest Ref Range: 3.5 - 5.0 g/dL  3.4 (L)  AST Latest Ref Range: 15 - 41 U/L  18  ALT Latest Ref Range: 0 - 44 U/L  16  Total Protein Latest Ref Range: 6.5 -  8.1 g/dL  6.8  Total Bilirubin Latest Ref Range: 0.3 - 1.2 mg/dL  0.7  GFR, Est Non African American Latest Ref Range: >60 mL/min  >60  GFR, Est African American Latest Ref Range: >60 mL/min  >60  WBC Latest Ref Range: 4.0 - 10.5 K/uL  13.0 (H)  RBC Latest Ref Range: 3.87 - 5.11 MIL/uL  4.02  Hemoglobin Latest Ref Range: 12.0 - 15.0 g/dL  12.3  HCT Latest Ref Range: 36.0 - 46.0 %  36.3  MCV Latest Ref Range: 80.0 - 100.0 fL  90.3  MCH Latest Ref Range: 26.0 - 34.0 pg  30.6  MCHC Latest Ref Range: 30.0 - 36.0 g/dL  33.9  RDW Latest Ref Range: 11.5 - 15.5 %  12.5  Platelets Latest Ref Range: 150 - 400 K/uL  186  nRBC Latest Ref Range: 0.0 - 0.2 %  0.0  Neutrophils Latest Units: %  88  Lymphocytes Latest Units: %  7  Monocytes Relative Latest Units: %  3  Eosinophil Latest Units: %  1  Basophil Latest Units: %  0  Immature Granulocytes Latest Units: %  1  NEUT# Latest Ref Range: 1.7 - 7.7 K/uL  11.5 (H)  Lymphocyte # Latest Ref Range: 0.7 - 4.0 K/uL  0.9  Monocyte # Latest Ref Range: 0.1 - 1.0 K/uL  0.4  Eosinophils Absolute Latest Ref Range: 0.0 - 0.5 K/uL  0.1  Basophils Absolute Latest Ref Range: 0.0 - 0.1 K/uL  0.0  Abs Immature Granulocytes Latest Ref Range: 0.00 - 0.07 K/uL  0.12 (H)  Appearance Latest Ref Range: CLEAR  CLEAR (A)   Bilirubin Urine Latest Ref Range: NEGATIVE  NEGATIVE   Color, Urine Latest Ref Range: YELLOW   YELLOW (A)   Glucose, UA Latest Ref Range: NEGATIVE mg/dL NEGATIVE   Hgb urine dipstick Latest Ref Range: NEGATIVE  NEGATIVE   Ketones, ur Latest Ref Range: NEGATIVE mg/dL NEGATIVE   Leukocytes, UA Latest Ref Range: NEGATIVE  NEGATIVE   Nitrite Latest Ref Range: NEGATIVE  NEGATIVE   pH Latest Ref Range: 5.0 - 8.0  7.0   Protein Latest Ref Range: NEGATIVE mg/dL NEGATIVE   Specific Gravity, Urine Latest Ref Range: 1.005 - 1.030  1.013   Bacteria, UA Latest Ref Range: NONE SEEN  RARE (A)   Mucus Unknown PRESENT   RBC / HPF Latest Ref Range: 0 - 5 RBC/hpf 0-5   Squamous Epithelial / LPF Latest Ref Range: 0 - 5  0-5   WBC, UA Latest Ref Range: 0 - 5 WBC/hpf 0-5     Procedures: Fetal heart and toco monitoring  Hospital Course: The patient was admitted to Labor and Delivery Triage for observation.   Discharge Condition: good  Disposition: Discharge disposition: 01-Home or Self Care       Diet: Regular diet  Discharge Activity: Activity as tolerated  Discharge Instructions    Discharge activity:  No Restrictions   Complete by:  As directed    Discharge diet:  No restrictions   Complete by:  As directed    No sexual activity restrictions   Complete by:  As directed    Notify physician for a general feeling that "something is not right"   Complete by:  As directed    Notify physician for increase or change in vaginal discharge   Complete by:  As directed    Notify physician for intestinal cramps, with or without diarrhea, sometimes described as "gas pain"   Complete by:  As directed    Notify physician for leaking of fluid   Complete by:  As directed    Notify physician for low, dull backache, unrelieved by heat or Tylenol   Complete by:  As directed    Notify physician for menstrual like cramps   Complete by:  As directed    Notify physician for pelvic pressure   Complete by:  As directed    Notify physician for uterine contractions.  These may be painless and feel like  the uterus is tightening or the baby is  "balling up"   Complete by:  As directed    Notify physician for vaginal bleeding   Complete by:  As directed    PRETERM LABOR:  Includes any of the follwing symptoms that occur between 20 - [redacted] weeks gestation.  If these symptoms are not stopped, preterm labor can result in preterm delivery, placing your baby at risk   Complete by:  As directed      Allergies as of 03/13/2018      Reactions   Ciprocin-fluocin-procin [fluocinolone]       Medication List    TAKE these medications   Doxylamine-Pyridoxine 10-10 MG Tbec Commonly known as:  DICLEGIS Take 2 tablets by mouth at bedtime. If symptoms persist, add one tablet in the morning and one in the afternoon   FLOVENT HFA 220 MCG/ACT inhaler Generic drug:  fluticasone INHALE 1 PUFF WITH SPACER TWICE A DAY FOR ASTHMA   metroNIDAZOLE 0.75 % vaginal gel Commonly known as:  METROGEL INSERT 1 APPLICATORFUL VAGINALLY AT BEDTIME 2 TIMES PER WEEK FOR 4 MONTHS FOR PREVENTION OF PV/ patient has not used since the beginning of the pregnancy.   prenatal multivitamin Tabs tablet Take 1 tablet by mouth daily at 12 noon.   PROAIR HFA 108 (90 Base) MCG/ACT inhaler Generic drug:  albuterol INHALE 2 PUFFS BY MOUTH EVERY 4 TO 6 HOURS AS NEEDED FOR WHEEZING, COUGH OR SHORTNESS OF BREATH   promethazine 25 MG tablet Commonly known as:  PHENERGAN TAKE 1/2 TO 1 TABLET BY MOUTH EVERY 6 HOURS AS NEEDED FOR NAUSEA OR VOMITING      Follow-up Information    [facility]. Go to.   Specialty:  Obstetrics and Gynecology Why:  regular scheduled prenatal appointment Contact information: [address] [place] [id number] [phone]          Total time spent taking care of this patient: 25 minutes  Signed: Rod Can, CNM  03/13/2018, 6:07 PM

## 2018-03-14 ENCOUNTER — Encounter: Payer: Medicaid Other | Admitting: Maternal Newborn

## 2018-03-15 LAB — URINE CULTURE

## 2018-03-15 NOTE — Progress Notes (Signed)
FYI. Patient you saw in triage.Tamara KitchenMarland Flynn

## 2018-03-23 ENCOUNTER — Ambulatory Visit (INDEPENDENT_AMBULATORY_CARE_PROVIDER_SITE_OTHER): Payer: Medicaid Other | Admitting: Advanced Practice Midwife

## 2018-03-23 ENCOUNTER — Ambulatory Visit (INDEPENDENT_AMBULATORY_CARE_PROVIDER_SITE_OTHER): Payer: Medicaid Other

## 2018-03-23 ENCOUNTER — Encounter: Payer: Self-pay | Admitting: Advanced Practice Midwife

## 2018-03-23 VITALS — BP 118/74 | Wt 150.0 lb

## 2018-03-23 DIAGNOSIS — Z3A25 25 weeks gestation of pregnancy: Secondary | ICD-10-CM

## 2018-03-23 DIAGNOSIS — Z362 Encounter for other antenatal screening follow-up: Secondary | ICD-10-CM

## 2018-03-23 DIAGNOSIS — Z3400 Encounter for supervision of normal first pregnancy, unspecified trimester: Secondary | ICD-10-CM

## 2018-03-23 DIAGNOSIS — Z369 Encounter for antenatal screening, unspecified: Secondary | ICD-10-CM

## 2018-03-23 NOTE — Progress Notes (Signed)
Routine Prenatal Care Visit  Subjective  Tamara Flynn is a 23 y.o. G1P0000 at [redacted]w[redacted]d being seen today for ongoing prenatal care.  She is currently monitored for the following issues for this low-risk pregnancy and has Supervision of normal first pregnancy, antepartum; Indication for care in labor and delivery, antepartum; and Abdominal pain during pregnancy in second trimester on their problem list.  ----------------------------------------------------------------------------------- Patient reports leg cramps. Recommended magnesium supplement 250 mg, epsom salt soaks, adequate hydration.   Contractions: Not present. Vag. Bleeding: None.  Movement: Present. Denies leaking of fluid.  ----------------------------------------------------------------------------------- The following portions of the patient's history were reviewed and updated as appropriate: allergies, current medications, past family history, past medical history, past social history, past surgical history and problem list. Problem list updated.   Objective  Blood pressure 118/74, weight 150 lb (68 kg), last menstrual period 09/24/2017. Pregravid weight 127 lb (57.6 kg) Total Weight Gain 23 lb (10.4 kg) Urinalysis: Urine Protein    Urine Glucose    Fetal Status: Fetal Heart Rate (bpm): 153 Fundal Height: 26 cm Movement: Present     Anatomy scan is now complete  General:  Alert, oriented and cooperative. Patient is in no acute distress.  Skin: Skin is warm and dry. No rash noted.   Cardiovascular: Normal heart rate noted  Respiratory: Normal respiratory effort, no problems with respiration noted  Abdomen: Soft, gravid, appropriate for gestational age. Pain/Pressure: Absent     Pelvic:  Cervical exam deferred        Extremities: Normal range of motion.  Edema: None  Mental Status: Normal mood and affect. Normal behavior. Normal judgment and thought content.   Assessment   23 y.o. G1P0000 at [redacted]w[redacted]d by  07/01/2018, by Last  Menstrual Period presenting for routine prenatal visit  Plan   FIRST Problems (from 11/18/17 to present)    Problem Noted Resolved   Abdominal pain during pregnancy in second trimester 03/13/2018 by Rod Can, CNM No   Supervision of normal first pregnancy, antepartum 11/18/2017 by Rexene Agent, CNM No   Overview Addendum 11/28/2017 11:01 AM by Homero Fellers, MD    Clinic [facility] Prenatal Labs  Dating  LMP=9wk [place] Blood type: B/Positive/-- (09/13 1055)   Genetic Screen 1 Screen:    AFP:     Quad:     NIPS: Antibody:Negative (09/13 1055)  Anatomic [place]  Rubella: <0.90 (09/13 1055) Varicella: Non-immune  GTT Early:               Third trimester:  RPR: Non Reactive (09/13 1055)   Rhogam  not needed HBsAg: Negative (09/13 1055)   TDaP vaccine                        Flu Shot: Declines HIV: Non Reactive (09/13 1055)   Baby Food                                GBS:   Contraception  Pap: 10/2017, NILM per patient report  CBB     CS/VBAC    Support Person                  Preterm labor symptoms and general obstetric precautions including but not limited to vaginal bleeding, contractions, leaking of fluid and fetal movement were reviewed in detail with the patient. Please refer to After Visit Summary for other counseling recommendations.   Return in  about 3 weeks (around 04/13/2018) for 28 wk labs and rob.  Rod Can, CNM 03/23/2018 2:43 PM

## 2018-03-23 NOTE — Progress Notes (Signed)
No vb. No lof.  

## 2018-03-23 NOTE — Patient Instructions (Signed)
Third Trimester of Pregnancy The third trimester is from week 28 through week 40 (months 7 through 9). The third trimester is a time when the unborn baby (fetus) is growing rapidly. At the end of the ninth month, the fetus is about 20 inches in length and weighs 6-10 pounds. Body changes during your third trimester Your body will continue to go through many changes during pregnancy. The changes vary from woman to woman. During the third trimester:  Your weight will continue to increase. You can expect to gain 25-35 pounds (11-16 kg) by the end of the pregnancy.  You may begin to get stretch marks on your hips, abdomen, and breasts.  You may urinate more often because the fetus is moving lower into your pelvis and pressing on your bladder.  You may develop or continue to have heartburn. This is caused by increased hormones that slow down muscles in the digestive tract.  You may develop or continue to have constipation because increased hormones slow digestion and cause the muscles that push waste through your intestines to relax.  You may develop hemorrhoids. These are swollen veins (varicose veins) in the rectum that can itch or be painful.  You may develop swollen, bulging veins (varicose veins) in your legs.  You may have increased body aches in the pelvis, back, or thighs. This is due to weight gain and increased hormones that are relaxing your joints.  You may have changes in your hair. These can include thickening of your hair, rapid growth, and changes in texture. Some women also have hair loss during or after pregnancy, or hair that feels dry or thin. Your hair will most likely return to normal after your baby is born.  Your breasts will continue to grow and they will continue to become tender. A yellow fluid (colostrum) may leak from your breasts. This is the first milk you are producing for your baby.  Your belly button may stick out.  You may notice more swelling in your hands,  face, or ankles.  You may have increased tingling or numbness in your hands, arms, and legs. The skin on your belly may also feel numb.  You may feel short of breath because of your expanding uterus.  You may have more problems sleeping. This can be caused by the size of your belly, increased need to urinate, and an increase in your body's metabolism.  You may notice the fetus "dropping," or moving lower in your abdomen (lightening).  You may have increased vaginal discharge.  You may notice your joints feel loose and you may have pain around your pelvic bone. What to expect at prenatal visits You will have prenatal exams every 2 weeks until week 36. Then you will have weekly prenatal exams. During a routine prenatal visit:  You will be weighed to make sure you and the baby are growing normally.  Your blood pressure will be taken.  Your abdomen will be measured to track your baby's growth.  The fetal heartbeat will be listened to.  Any test results from the previous visit will be discussed.  You may have a cervical check near your due date to see if your cervix has softened or thinned (effaced).  You will be tested for Group B streptococcus. This happens between 35 and 37 weeks. Your health care provider may ask you:  What your birth plan is.  How you are feeling.  If you are feeling the baby move.  If you have had any abnormal   symptoms, such as leaking fluid, bleeding, severe headaches, or abdominal cramping.  If you are using any tobacco products, including cigarettes, chewing tobacco, and electronic cigarettes.  If you have any questions. Other tests or screenings that may be performed during your third trimester include:  Blood tests that check for low iron levels (anemia).  Fetal testing to check the health, activity level, and growth of the fetus. Testing is done if you have certain medical conditions or if there are problems during the pregnancy.  Nonstress test  (NST). This test checks the health of your baby to make sure there are no signs of problems, such as the baby not getting enough oxygen. During this test, a belt is placed around your belly. The baby is made to move, and its heart rate is monitored during movement. What is false labor? False labor is a condition in which you feel small, irregular tightenings of the muscles in the womb (contractions) that usually go away with rest, changing position, or drinking water. These are called Braxton Hicks contractions. Contractions may last for hours, days, or even weeks before true labor sets in. If contractions come at regular intervals, become more frequent, increase in intensity, or become painful, you should see your health care provider. What are the signs of labor?  Abdominal cramps.  Regular contractions that start at 10 minutes apart and become stronger and more frequent with time.  Contractions that start on the top of the uterus and spread down to the lower abdomen and back.  Increased pelvic pressure and dull back pain.  A watery or bloody mucus discharge that comes from the vagina.  Leaking of amniotic fluid. This is also known as your "water breaking." It could be a slow trickle or a gush. Let your health care provider know if it has a color or strange odor. If you have any of these signs, call your health care provider right away, even if it is before your due date. Follow these instructions at home: Medicines  Follow your health care provider's instructions regarding medicine use. Specific medicines may be either safe or unsafe to take during pregnancy.  Take a prenatal vitamin that contains at least 600 micrograms (mcg) of folic acid.  If you develop constipation, try taking a stool softener if your health care provider approves. Eating and drinking   Eat a balanced diet that includes fresh fruits and vegetables, whole grains, good sources of protein such as meat, eggs, or tofu,  and low-fat dairy. Your health care provider will help you determine the amount of weight gain that is right for you.  Avoid raw meat and uncooked cheese. These carry germs that can cause birth defects in the baby.  If you have low calcium intake from food, talk to your health care provider about whether you should take a daily calcium supplement.  Eat four or five small meals rather than three large meals a day.  Limit foods that are high in fat and processed sugars, such as fried and sweet foods.  To prevent constipation: ? Drink enough fluid to keep your urine clear or pale yellow. ? Eat foods that are high in fiber, such as fresh fruits and vegetables, whole grains, and beans. Activity  Exercise only as directed by your health care provider. Most women can continue their usual exercise routine during pregnancy. Try to exercise for 30 minutes at least 5 days a week. Stop exercising if you experience uterine contractions.  Avoid heavy lifting.  Do   not exercise in extreme heat or humidity, or at high altitudes.  Wear low-heel, comfortable shoes.  Practice good posture.  You may continue to have sex unless your health care provider tells you otherwise. Relieving pain and discomfort  Take frequent breaks and rest with your legs elevated if you have leg cramps or low back pain.  Take warm sitz baths to soothe any pain or discomfort caused by hemorrhoids. Use hemorrhoid cream if your health care provider approves.  Wear a good support bra to prevent discomfort from breast tenderness.  If you develop varicose veins: ? Wear support pantyhose or compression stockings as told by your healthcare provider. ? Elevate your feet for 15 minutes, 3-4 times a day. Prenatal care  Write down your questions. Take them to your prenatal visits.  Keep all your prenatal visits as told by your health care provider. This is important. Safety  Wear your seat belt at all times when driving.  Make  a list of emergency phone numbers, including numbers for family, friends, the hospital, and police and fire departments. General instructions  Avoid cat litter boxes and soil used by cats. These carry germs that can cause birth defects in the baby. If you have a cat, ask someone to clean the litter box for you.  Do not travel far distances unless it is absolutely necessary and only with the approval of your health care provider.  Do not use hot tubs, steam rooms, or saunas.  Do not drink alcohol.  Do not use any products that contain nicotine or tobacco, such as cigarettes and e-cigarettes. If you need help quitting, ask your health care provider.  Do not use any medicinal herbs or unprescribed drugs. These chemicals affect the formation and growth of the baby.  Do not douche or use tampons or scented sanitary pads.  Do not cross your legs for long periods of time.  To prepare for the arrival of your baby: ? Take prenatal classes to understand, practice, and ask questions about labor and delivery. ? Make a trial run to the hospital. ? Visit the hospital and tour the maternity area. ? Arrange for maternity or paternity leave through employers. ? Arrange for family and friends to take care of pets while you are in the hospital. ? Purchase a rear-facing car seat and make sure you know how to install it in your car. ? Pack your hospital bag. ? Prepare the baby's nursery. Make sure to remove all pillows and stuffed animals from the baby's crib to prevent suffocation.  Visit your dentist if you have not gone during your pregnancy. Use a soft toothbrush to brush your teeth and be gentle when you floss. Contact a health care provider if:  You are unsure if you are in labor or if your water has broken.  You become dizzy.  You have mild pelvic cramps, pelvic pressure, or nagging pain in your abdominal area.  You have lower back pain.  You have persistent nausea, vomiting, or  diarrhea.  You have an unusual or bad smelling vaginal discharge.  You have pain when you urinate. Get help right away if:  Your water breaks before 37 weeks.  You have regular contractions less than 5 minutes apart before 37 weeks.  You have a fever.  You are leaking fluid from your vagina.  You have spotting or bleeding from your vagina.  You have severe abdominal pain or cramping.  You have rapid weight loss or weight gain.  You have   shortness of breath with chest pain.  You notice sudden or extreme swelling of your face, hands, ankles, feet, or legs.  Your baby makes fewer than 10 movements in 2 hours.  You have severe headaches that do not go away when you take medicine.  You have vision changes. Summary  The third trimester is from week 28 through week 40, months 7 through 9. The third trimester is a time when the unborn baby (fetus) is growing rapidly.  During the third trimester, your discomfort may increase as you and your baby continue to gain weight. You may have abdominal, leg, and back pain, sleeping problems, and an increased need to urinate.  During the third trimester your breasts will keep growing and they will continue to become tender. A yellow fluid (colostrum) may leak from your breasts. This is the first milk you are producing for your baby.  False labor is a condition in which you feel small, irregular tightenings of the muscles in the womb (contractions) that eventually go away. These are called Braxton Hicks contractions. Contractions may last for hours, days, or even weeks before true labor sets in.  Signs of labor can include: abdominal cramps; regular contractions that start at 10 minutes apart and become stronger and more frequent with time; watery or bloody mucus discharge that comes from the vagina; increased pelvic pressure and dull back pain; and leaking of amniotic fluid. This information is not intended to replace advice given to you by your  health care provider. Make sure you discuss any questions you have with your health care provider. Document Released: 02/16/2001 Document Revised: 03/30/2016 Document Reviewed: 03/30/2016 Elsevier Interactive Patient Education  2019 [employer]  

## 2018-04-14 ENCOUNTER — Ambulatory Visit (INDEPENDENT_AMBULATORY_CARE_PROVIDER_SITE_OTHER): Payer: Medicaid Other | Admitting: Maternal Newborn

## 2018-04-14 ENCOUNTER — Other Ambulatory Visit: Payer: Medicaid Other

## 2018-04-14 ENCOUNTER — Encounter: Payer: Self-pay | Admitting: Maternal Newborn

## 2018-04-14 VITALS — BP 128/62 | Wt 154.0 lb

## 2018-04-14 DIAGNOSIS — Z3403 Encounter for supervision of normal first pregnancy, third trimester: Secondary | ICD-10-CM

## 2018-04-14 DIAGNOSIS — Z3689 Encounter for other specified antenatal screening: Secondary | ICD-10-CM

## 2018-04-14 DIAGNOSIS — Z3400 Encounter for supervision of normal first pregnancy, unspecified trimester: Secondary | ICD-10-CM

## 2018-04-14 DIAGNOSIS — Z3A28 28 weeks gestation of pregnancy: Secondary | ICD-10-CM

## 2018-04-14 LAB — POCT URINALYSIS DIPSTICK OB
Glucose, UA: NEGATIVE
PROTEIN: NEGATIVE

## 2018-04-14 NOTE — Progress Notes (Signed)
ROB Rib pain, trouble sleeping, hard time breathing 28 week labs

## 2018-04-14 NOTE — Patient Instructions (Signed)
Third Trimester of Pregnancy The third trimester is from week 28 through week 40 (months 7 through 9). The third trimester is a time when the unborn baby (fetus) is growing rapidly. At the end of the ninth month, the fetus is about 20 inches in length and weighs 6-10 pounds. Body changes during your third trimester Your body will continue to go through many changes during pregnancy. The changes vary from woman to woman. During the third trimester:  Your weight will continue to increase. You can expect to gain 25-35 pounds (11-16 kg) by the end of the pregnancy.  You may begin to get stretch marks on your hips, abdomen, and breasts.  You may urinate more often because the fetus is moving lower into your pelvis and pressing on your bladder.  You may develop or continue to have heartburn. This is caused by increased hormones that slow down muscles in the digestive tract.  You may develop or continue to have constipation because increased hormones slow digestion and cause the muscles that push waste through your intestines to relax.  You may develop hemorrhoids. These are swollen veins (varicose veins) in the rectum that can itch or be painful.  You may develop swollen, bulging veins (varicose veins) in your legs.  You may have increased body aches in the pelvis, back, or thighs. This is due to weight gain and increased hormones that are relaxing your joints.  You may have changes in your hair. These can include thickening of your hair, rapid growth, and changes in texture. Some women also have hair loss during or after pregnancy, or hair that feels dry or thin. Your hair will most likely return to normal after your baby is born.  Your breasts will continue to grow and they will continue to become tender. A yellow fluid (colostrum) may leak from your breasts. This is the first milk you are producing for your baby.  Your belly button may stick out.  You may notice more swelling in your hands,  face, or ankles.  You may have increased tingling or numbness in your hands, arms, and legs. The skin on your belly may also feel numb.  You may feel short of breath because of your expanding uterus.  You may have more problems sleeping. This can be caused by the size of your belly, increased need to urinate, and an increase in your body's metabolism.  You may notice the fetus "dropping," or moving lower in your abdomen (lightening).  You may have increased vaginal discharge.  You may notice your joints feel loose and you may have pain around your pelvic bone. What to expect at prenatal visits You will have prenatal exams every 2 weeks until week 36. Then you will have weekly prenatal exams. During a routine prenatal visit:  You will be weighed to make sure you and the baby are growing normally.  Your blood pressure will be taken.  Your abdomen will be measured to track your baby's growth.  The fetal heartbeat will be listened to.  Any test results from the previous visit will be discussed.  You may have a cervical check near your due date to see if your cervix has softened or thinned (effaced).  You will be tested for Group B streptococcus. This happens between 35 and 37 weeks. Your health care provider may ask you:  What your birth plan is.  How you are feeling.  If you are feeling the baby move.  If you have had any abnormal   symptoms, such as leaking fluid, bleeding, severe headaches, or abdominal cramping.  If you are using any tobacco products, including cigarettes, chewing tobacco, and electronic cigarettes.  If you have any questions. Other tests or screenings that may be performed during your third trimester include:  Blood tests that check for low iron levels (anemia).  Fetal testing to check the health, activity level, and growth of the fetus. Testing is done if you have certain medical conditions or if there are problems during the pregnancy.  Nonstress test  (NST). This test checks the health of your baby to make sure there are no signs of problems, such as the baby not getting enough oxygen. During this test, a belt is placed around your belly. The baby is made to move, and its heart rate is monitored during movement. What is false labor? False labor is a condition in which you feel small, irregular tightenings of the muscles in the womb (contractions) that usually go away with rest, changing position, or drinking water. These are called Braxton Hicks contractions. Contractions may last for hours, days, or even weeks before true labor sets in. If contractions come at regular intervals, become more frequent, increase in intensity, or become painful, you should see your health care provider. What are the signs of labor?  Abdominal cramps.  Regular contractions that start at 10 minutes apart and become stronger and more frequent with time.  Contractions that start on the top of the uterus and spread down to the lower abdomen and back.  Increased pelvic pressure and dull back pain.  A watery or bloody mucus discharge that comes from the vagina.  Leaking of amniotic fluid. This is also known as your "water breaking." It could be a slow trickle or a gush. Let your health care provider know if it has a color or strange odor. If you have any of these signs, call your health care provider right away, even if it is before your due date. Follow these instructions at home: Medicines  Follow your health care provider's instructions regarding medicine use. Specific medicines may be either safe or unsafe to take during pregnancy.  Take a prenatal vitamin that contains at least 600 micrograms (mcg) of folic acid.  If you develop constipation, try taking a stool softener if your health care provider approves. Eating and drinking   Eat a balanced diet that includes fresh fruits and vegetables, whole grains, good sources of protein such as meat, eggs, or tofu,  and low-fat dairy. Your health care provider will help you determine the amount of weight gain that is right for you.  Avoid raw meat and uncooked cheese. These carry germs that can cause birth defects in the baby.  If you have low calcium intake from food, talk to your health care provider about whether you should take a daily calcium supplement.  Eat four or five small meals rather than three large meals a day.  Limit foods that are high in fat and processed sugars, such as fried and sweet foods.  To prevent constipation: ? Drink enough fluid to keep your urine clear or pale yellow. ? Eat foods that are high in fiber, such as fresh fruits and vegetables, whole grains, and beans. Activity  Exercise only as directed by your health care provider. Most women can continue their usual exercise routine during pregnancy. Try to exercise for 30 minutes at least 5 days a week. Stop exercising if you experience uterine contractions.  Avoid heavy lifting.  Do   not exercise in extreme heat or humidity, or at high altitudes.  Wear low-heel, comfortable shoes.  Practice good posture.  You may continue to have sex unless your health care provider tells you otherwise. Relieving pain and discomfort  Take frequent breaks and rest with your legs elevated if you have leg cramps or low back pain.  Take warm sitz baths to soothe any pain or discomfort caused by hemorrhoids. Use hemorrhoid cream if your health care provider approves.  Wear a good support bra to prevent discomfort from breast tenderness.  If you develop varicose veins: ? Wear support pantyhose or compression stockings as told by your healthcare provider. ? Elevate your feet for 15 minutes, 3-4 times a day. Prenatal care  Write down your questions. Take them to your prenatal visits.  Keep all your prenatal visits as told by your health care provider. This is important. Safety  Wear your seat belt at all times when driving.  Make  a list of emergency phone numbers, including numbers for family, friends, the hospital, and police and fire departments. General instructions  Avoid cat litter boxes and soil used by cats. These carry germs that can cause birth defects in the baby. If you have a cat, ask someone to clean the litter box for you.  Do not travel far distances unless it is absolutely necessary and only with the approval of your health care provider.  Do not use hot tubs, steam rooms, or saunas.  Do not drink alcohol.  Do not use any products that contain nicotine or tobacco, such as cigarettes and e-cigarettes. If you need help quitting, ask your health care provider.  Do not use any medicinal herbs or unprescribed drugs. These chemicals affect the formation and growth of the baby.  Do not douche or use tampons or scented sanitary pads.  Do not cross your legs for long periods of time.  To prepare for the arrival of your baby: ? Take prenatal classes to understand, practice, and ask questions about labor and delivery. ? Make a trial run to the hospital. ? Visit the hospital and tour the maternity area. ? Arrange for maternity or paternity leave through employers. ? Arrange for family and friends to take care of pets while you are in the hospital. ? Purchase a rear-facing car seat and make sure you know how to install it in your car. ? Pack your hospital bag. ? Prepare the baby's nursery. Make sure to remove all pillows and stuffed animals from the baby's crib to prevent suffocation.  Visit your dentist if you have not gone during your pregnancy. Use a soft toothbrush to brush your teeth and be gentle when you floss. Contact a health care provider if:  You are unsure if you are in labor or if your water has broken.  You become dizzy.  You have mild pelvic cramps, pelvic pressure, or nagging pain in your abdominal area.  You have lower back pain.  You have persistent nausea, vomiting, or  diarrhea.  You have an unusual or bad smelling vaginal discharge.  You have pain when you urinate. Get help right away if:  Your water breaks before 37 weeks.  You have regular contractions less than 5 minutes apart before 37 weeks.  You have a fever.  You are leaking fluid from your vagina.  You have spotting or bleeding from your vagina.  You have severe abdominal pain or cramping.  You have rapid weight loss or weight gain.  You have   shortness of breath with chest pain.  You notice sudden or extreme swelling of your face, hands, ankles, feet, or legs.  Your baby makes fewer than 10 movements in 2 hours.  You have severe headaches that do not go away when you take medicine.  You have vision changes. Summary  The third trimester is from week 28 through week 40, months 7 through 9. The third trimester is a time when the unborn baby (fetus) is growing rapidly.  During the third trimester, your discomfort may increase as you and your baby continue to gain weight. You may have abdominal, leg, and back pain, sleeping problems, and an increased need to urinate.  During the third trimester your breasts will keep growing and they will continue to become tender. A yellow fluid (colostrum) may leak from your breasts. This is the first milk you are producing for your baby.  False labor is a condition in which you feel small, irregular tightenings of the muscles in the womb (contractions) that eventually go away. These are called Braxton Hicks contractions. Contractions may last for hours, days, or even weeks before true labor sets in.  Signs of labor can include: abdominal cramps; regular contractions that start at 10 minutes apart and become stronger and more frequent with time; watery or bloody mucus discharge that comes from the vagina; increased pelvic pressure and dull back pain; and leaking of amniotic fluid. This information is not intended to replace advice given to you by your  health care provider. Make sure you discuss any questions you have with your health care provider. Document Released: 02/16/2001 Document Revised: 03/30/2016 Document Reviewed: 03/30/2016 Elsevier Interactive Patient Education  2019 [employer]  

## 2018-04-14 NOTE — Progress Notes (Signed)
Routine Prenatal Care Visit  Subjective  Tamara Flynn is a 23 y.o. G1P0000 at [redacted]w[redacted]d being seen today for ongoing prenatal care.  She is currently monitored for the following issues for this low-risk pregnancy and has Supervision of normal first pregnancy, antepartum and Abdominal pain during pregnancy in second trimester on their problem list.  ----------------------------------------------------------------------------------- Patient reports pain in her ribs on the right side. It is worse when she is sitting up straight for long periods or when she is on her sides while sleeping. Tylenol did not relieve it. Not associated with food intake. Contractions: Not present. Vag. Bleeding: None.  Movement: Present. No leaking of fluid.  ----------------------------------------------------------------------------------- The following portions of the patient's history were reviewed and updated as appropriate: allergies, current medications, past family history, past medical history, past social history, past surgical history and problem list. Problem list updated.  Objective  Blood pressure 128/62, weight 154 lb (69.9 kg), last menstrual period 09/24/2017. Pregravid weight 127 lb (57.6 kg) Total Weight Gain 27 lb (12.2 kg) Body mass index is 31.1 kg/m.   Urinalysis: Urine dipstick shows negative for glucose, protein.  Fetal Status: Fetal Heart Rate (bpm): 150 Fundal Height: 30 cm Movement: Present     General:  Alert, oriented and cooperative. Patient is in no acute distress.  Skin: Skin is warm and dry. No rash noted.   Cardiovascular: Normal heart rate noted  Respiratory: Normal respiratory effort, no problems with respiration noted  Abdomen: Soft, gravid, appropriate for gestational age. Pain/Pressure: Absent     Pelvic:  Cervical exam deferred        Extremities: Normal range of motion.     Mental Status: Normal mood and affect. Normal behavior. Normal judgment and thought content.      Assessment   23 y.o. G1P0000 at [redacted]w[redacted]d, EDD 07/01/2018 by Last Menstrual Period presenting for a routine prenatal visit.  Plan   FIRST Problems (from 11/18/17 to present)    Problem Noted Resolved   Abdominal pain during pregnancy in second trimester 03/13/2018 by Rod Can, CNM No   Supervision of normal first pregnancy, antepartum 11/18/2017 by Rexene Agent, CNM No   Overview Addendum 11/28/2017 11:01 AM by Homero Fellers, MD    [facility]inic [facility] Prenatal Labs  Dating  LMP=9wk [place] Blood type: B/Positive/-- (09/13 1055)   Genetic Screen 1 Screen:    AFP:     Quad:     NIPS: Antibody:Negative (09/13 1055)  Anatomic [place]  Rubella: <0.90 (09/13 1055) Varicella: Non-immune  GTT Early:               Third trimester:  RPR: Non Reactive (09/13 1055)   Rhogam  not needed HBsAg: Negative (09/13 1055)   TDaP vaccine                        Flu Shot: Declines HIV: Non Reactive (09/13 1055)   Baby Food                                GBS:   Contraception  Pap: 10/2017, NILM per patient report  CBB     CS/VBAC    Support Person               Discussed comfort measures for rib pain: sleeping elevated, heating pad, etc.  Growth scan next time, fundal height > dates.  Please refer to After  Visit Summary for other counseling recommendations.   Return in about 2 weeks (around 04/28/2018) for ROB and growth scan.  Avel Sensor, CNM 04/14/2018

## 2018-04-15 LAB — 28 WEEK RH+PANEL
Basophils Absolute: 0 10*3/uL (ref 0.0–0.2)
Basos: 0 %
EOS (ABSOLUTE): 0.1 10*3/uL (ref 0.0–0.4)
Eos: 1 %
Gestational Diabetes Screen: 115 mg/dL (ref 65–139)
HIV SCREEN 4TH GENERATION: NONREACTIVE
Hematocrit: 33.7 % — ABNORMAL LOW (ref 34.0–46.6)
Hemoglobin: 11.9 g/dL (ref 11.1–15.9)
Immature Grans (Abs): 0.1 10*3/uL (ref 0.0–0.1)
Immature Granulocytes: 1 %
LYMPHS ABS: 1.3 10*3/uL (ref 0.7–3.1)
Lymphs: 11 %
MCH: 30.9 pg (ref 26.6–33.0)
MCHC: 35.3 g/dL (ref 31.5–35.7)
MCV: 88 fL (ref 79–97)
Monocytes Absolute: 0.7 10*3/uL (ref 0.1–0.9)
Monocytes: 5 %
NEUTROS ABS: 10.3 10*3/uL — AB (ref 1.4–7.0)
Neutrophils: 82 %
PLATELETS: 197 10*3/uL (ref 150–450)
RBC: 3.85 x10E6/uL (ref 3.77–5.28)
RDW: 12.1 % (ref 11.7–15.4)
RPR Ser Ql: NONREACTIVE
WBC: 12.6 10*3/uL — ABNORMAL HIGH (ref 3.4–10.8)

## 2018-04-28 ENCOUNTER — Ambulatory Visit (INDEPENDENT_AMBULATORY_CARE_PROVIDER_SITE_OTHER): Payer: Medicaid Other | Admitting: Maternal Newborn

## 2018-04-28 ENCOUNTER — Ambulatory Visit (INDEPENDENT_AMBULATORY_CARE_PROVIDER_SITE_OTHER): Payer: Medicaid Other

## 2018-04-28 VITALS — BP 120/80 | Wt 155.0 lb

## 2018-04-28 DIAGNOSIS — Z3689 Encounter for other specified antenatal screening: Secondary | ICD-10-CM

## 2018-04-28 DIAGNOSIS — Z23 Encounter for immunization: Secondary | ICD-10-CM

## 2018-04-28 DIAGNOSIS — O26893 Other specified pregnancy related conditions, third trimester: Secondary | ICD-10-CM

## 2018-04-28 DIAGNOSIS — O26843 Uterine size-date discrepancy, third trimester: Secondary | ICD-10-CM | POA: Diagnosis not present

## 2018-04-28 DIAGNOSIS — O403XX0 Polyhydramnios, third trimester, not applicable or unspecified: Secondary | ICD-10-CM

## 2018-04-28 DIAGNOSIS — Z3400 Encounter for supervision of normal first pregnancy, unspecified trimester: Secondary | ICD-10-CM

## 2018-04-28 DIAGNOSIS — Z369 Encounter for antenatal screening, unspecified: Secondary | ICD-10-CM

## 2018-04-28 DIAGNOSIS — R12 Heartburn: Secondary | ICD-10-CM

## 2018-04-28 DIAGNOSIS — O0990 Supervision of high risk pregnancy, unspecified, unspecified trimester: Secondary | ICD-10-CM

## 2018-04-28 DIAGNOSIS — Z3A30 30 weeks gestation of pregnancy: Secondary | ICD-10-CM | POA: Diagnosis not present

## 2018-04-28 DIAGNOSIS — O26899 Other specified pregnancy related conditions, unspecified trimester: Secondary | ICD-10-CM

## 2018-04-28 DIAGNOSIS — O409XX0 Polyhydramnios, unspecified trimester, not applicable or unspecified: Secondary | ICD-10-CM | POA: Insufficient documentation

## 2018-04-28 MED ORDER — FAMOTIDINE 20 MG PO TABS: 20.0000 mg | ORAL_TABLET | Freq: Two times a day (BID) | ORAL | 3 refills | Status: DC

## 2018-04-28 NOTE — Progress Notes (Signed)
Routine Prenatal Care Visit  Subjective  Tamara Flynn is a 23 y.o. G1P0000 at [redacted]w[redacted]d being seen today for ongoing prenatal care.  She is currently monitored for the following issues for this high-risk pregnancy and has Supervision of high risk pregnancy, antepartum; Abdominal pain during pregnancy in second trimester; and Polyhydramnios affecting pregnancy on their problem list.  ----------------------------------------------------------------------------------- Patient reports carpal tunnel symptoms and heartburn. Still having pain in her ribs and some leg pain consistent with sciatica. Contractions: Not present. Vag. Bleeding: None.  Movement: Present. No leaking of fluid.  ----------------------------------------------------------------------------------- The following portions of the patient's history were reviewed and updated as appropriate: allergies, current medications, past family history, past medical history, past social history, past surgical history and problem list. Problem list updated.  Objective  Blood pressure 120/80, weight 155 lb (70.3 kg), last menstrual period 09/24/2017. Pregravid weight 127 lb (57.6 kg) Total Weight Gain 28 lb (12.7 kg)  Fetal Status: Fetal Heart Rate (bpm): 145   Movement: Present     General:  Alert, oriented and cooperative. Patient is in no acute distress.  Skin: Skin is warm and dry. No rash noted.   Cardiovascular: Normal heart rate noted  Respiratory: Normal respiratory effort, no problems with respiration noted  Abdomen: Soft, gravid, appropriate for gestational age. Pain/Pressure: Present     Pelvic:  Cervical exam deferred        Extremities: Normal range of motion.  Edema: None  Mental Status: Normal mood and affect. Normal behavior. Normal judgment and thought content.     Assessment   23 y.o. G1P0000 at [redacted]w[redacted]d, EDD 07/01/2018 by Last Menstrual Period presenting for a routine prenatal visit.  Plan   FIRST Problems (from  11/18/17 to present)    Problem Noted Resolved   Abdominal pain during pregnancy in second trimester 03/13/2018 by Rod Can, CNM No   Supervision of normal first pregnancy, antepartum 11/18/2017 by Rexene Agent, CNM No   Overview Addendum 11/28/2017 11:01 AM by Homero Fellers, MD    [facility]inic [facility] Prenatal Labs  Dating  LMP=9wk [place] Blood type: B/Positive/-- (09/13 1055)   Genetic Screen 1 Screen:    AFP:     Quad:     NIPS: Antibody:Negative (09/13 1055)  Anatomic [place]  Rubella: <0.90 (09/13 1055) Varicella: Non-immune  GTT Early:               Third trimester:  RPR: Non Reactive (09/13 1055)   Rhogam  not needed HBsAg: Negative (09/13 1055)   TDaP vaccine                        Flu Shot: Declines HIV: Non Reactive (09/13 1055)   Baby Food                                GBS:   Contraception  Pap: 10/2017, NILM per patient report  CBB     CS/VBAC    Support Person               Polyhydramnios on ultrasound today. AFI=27.36 cm. Growth percentile was 54.5%, HC > 97.7%, EFW 3 lb, 13 oz. Breech presentation.   Discussed ultrasound and referral to [facility] for consultation and ultrasound due to polyhydramnios.  Advised wrist brace for carpal tunnel symptoms. Rx for Pepcid to help with heartburn.  Received TDaP vaccine today.  Please refer to After  Visit Summary for other counseling recommendations.   Return in about 2 weeks (around 05/12/2018) for [place] with AFI/NST.  Avel Sensor, CNM 04/28/2018

## 2018-05-02 ENCOUNTER — Encounter: Payer: Self-pay | Admitting: Maternal Newborn

## 2018-05-02 NOTE — Patient Instructions (Signed)
Third Trimester of Pregnancy The third trimester is from week 28 through week 40 (months 7 through 9). The third trimester is a time when the unborn baby (fetus) is growing rapidly. At the end of the ninth month, the fetus is about 20 inches in length and weighs 6-10 pounds. Body changes during your third trimester Your body will continue to go through many changes during pregnancy. The changes vary from woman to woman. During the third trimester:  Your weight will continue to increase. You can expect to gain 25-35 pounds (11-16 kg) by the end of the pregnancy.  You may begin to get stretch marks on your hips, abdomen, and breasts.  You may urinate more often because the fetus is moving lower into your pelvis and pressing on your bladder.  You may develop or continue to have heartburn. This is caused by increased hormones that slow down muscles in the digestive tract.  You may develop or continue to have constipation because increased hormones slow digestion and cause the muscles that push waste through your intestines to relax.  You may develop hemorrhoids. These are swollen veins (varicose veins) in the rectum that can itch or be painful.  You may develop swollen, bulging veins (varicose veins) in your legs.  You may have increased body aches in the pelvis, back, or thighs. This is due to weight gain and increased hormones that are relaxing your joints.  You may have changes in your hair. These can include thickening of your hair, rapid growth, and changes in texture. Some women also have hair loss during or after pregnancy, or hair that feels dry or thin. Your hair will most likely return to normal after your baby is born.  Your breasts will continue to grow and they will continue to become tender. A yellow fluid (colostrum) may leak from your breasts. This is the first milk you are producing for your baby.  Your belly button may stick out.  You may notice more swelling in your hands,  face, or ankles.  You may have increased tingling or numbness in your hands, arms, and legs. The skin on your belly may also feel numb.  You may feel short of breath because of your expanding uterus.  You may have more problems sleeping. This can be caused by the size of your belly, increased need to urinate, and an increase in your body's metabolism.  You may notice the fetus "dropping," or moving lower in your abdomen (lightening).  You may have increased vaginal discharge.  You may notice your joints feel loose and you may have pain around your pelvic bone. What to expect at prenatal visits You will have prenatal exams every 2 weeks until week 36. Then you will have weekly prenatal exams. During a routine prenatal visit:  You will be weighed to make sure you and the baby are growing normally.  Your blood pressure will be taken.  Your abdomen will be measured to track your baby's growth.  The fetal heartbeat will be listened to.  Any test results from the previous visit will be discussed.  You may have a cervical check near your due date to see if your cervix has softened or thinned (effaced).  You will be tested for Group B streptococcus. This happens between 35 and 37 weeks. Your health care provider may ask you:  What your birth plan is.  How you are feeling.  If you are feeling the baby move.  If you have had any abnormal   symptoms, such as leaking fluid, bleeding, severe headaches, or abdominal cramping.  If you are using any tobacco products, including cigarettes, chewing tobacco, and electronic cigarettes.  If you have any questions. Other tests or screenings that may be performed during your third trimester include:  Blood tests that check for low iron levels (anemia).  Fetal testing to check the health, activity level, and growth of the fetus. Testing is done if you have certain medical conditions or if there are problems during the pregnancy.  Nonstress test  (NST). This test checks the health of your baby to make sure there are no signs of problems, such as the baby not getting enough oxygen. During this test, a belt is placed around your belly. The baby is made to move, and its heart rate is monitored during movement. What is false labor? False labor is a condition in which you feel small, irregular tightenings of the muscles in the womb (contractions) that usually go away with rest, changing position, or drinking water. These are called Braxton Hicks contractions. Contractions may last for hours, days, or even weeks before true labor sets in. If contractions come at regular intervals, become more frequent, increase in intensity, or become painful, you should see your health care provider. What are the signs of labor?  Abdominal cramps.  Regular contractions that start at 10 minutes apart and become stronger and more frequent with time.  Contractions that start on the top of the uterus and spread down to the lower abdomen and back.  Increased pelvic pressure and dull back pain.  A watery or bloody mucus discharge that comes from the vagina.  Leaking of amniotic fluid. This is also known as your "water breaking." It could be a slow trickle or a gush. Let your health care provider know if it has a color or strange odor. If you have any of these signs, call your health care provider right away, even if it is before your due date. Follow these instructions at home: Medicines  Follow your health care provider's instructions regarding medicine use. Specific medicines may be either safe or unsafe to take during pregnancy.  Take a prenatal vitamin that contains at least 600 micrograms (mcg) of folic acid.  If you develop constipation, try taking a stool softener if your health care provider approves. Eating and drinking   Eat a balanced diet that includes fresh fruits and vegetables, whole grains, good sources of protein such as meat, eggs, or tofu,  and low-fat dairy. Your health care provider will help you determine the amount of weight gain that is right for you.  Avoid raw meat and uncooked cheese. These carry germs that can cause birth defects in the baby.  If you have low calcium intake from food, talk to your health care provider about whether you should take a daily calcium supplement.  Eat four or five small meals rather than three large meals a day.  Limit foods that are high in fat and processed sugars, such as fried and sweet foods.  To prevent constipation: ? Drink enough fluid to keep your urine clear or pale yellow. ? Eat foods that are high in fiber, such as fresh fruits and vegetables, whole grains, and beans. Activity  Exercise only as directed by your health care provider. Most women can continue their usual exercise routine during pregnancy. Try to exercise for 30 minutes at least 5 days a week. Stop exercising if you experience uterine contractions.  Avoid heavy lifting.  Do   not exercise in extreme heat or humidity, or at high altitudes.  Wear low-heel, comfortable shoes.  Practice good posture.  You may continue to have sex unless your health care provider tells you otherwise. Relieving pain and discomfort  Take frequent breaks and rest with your legs elevated if you have leg cramps or low back pain.  Take warm sitz baths to soothe any pain or discomfort caused by hemorrhoids. Use hemorrhoid cream if your health care provider approves.  Wear a good support bra to prevent discomfort from breast tenderness.  If you develop varicose veins: ? Wear support pantyhose or compression stockings as told by your healthcare provider. ? Elevate your feet for 15 minutes, 3-4 times a day. Prenatal care  Write down your questions. Take them to your prenatal visits.  Keep all your prenatal visits as told by your health care provider. This is important. Safety  Wear your seat belt at all times when driving.  Make  a list of emergency phone numbers, including numbers for family, friends, the hospital, and police and fire departments. General instructions  Avoid cat litter boxes and soil used by cats. These carry germs that can cause birth defects in the baby. If you have a cat, ask someone to clean the litter box for you.  Do not travel far distances unless it is absolutely necessary and only with the approval of your health care provider.  Do not use hot tubs, steam rooms, or saunas.  Do not drink alcohol.  Do not use any products that contain nicotine or tobacco, such as cigarettes and e-cigarettes. If you need help quitting, ask your health care provider.  Do not use any medicinal herbs or unprescribed drugs. These chemicals affect the formation and growth of the baby.  Do not douche or use tampons or scented sanitary pads.  Do not cross your legs for long periods of time.  To prepare for the arrival of your baby: ? Take prenatal classes to understand, practice, and ask questions about labor and delivery. ? Make a trial run to the hospital. ? Visit the hospital and tour the maternity area. ? Arrange for maternity or paternity leave through employers. ? Arrange for family and friends to take care of pets while you are in the hospital. ? Purchase a rear-facing car seat and make sure you know how to install it in your car. ? Pack your hospital bag. ? Prepare the baby's nursery. Make sure to remove all pillows and stuffed animals from the baby's crib to prevent suffocation.  Visit your dentist if you have not gone during your pregnancy. Use a soft toothbrush to brush your teeth and be gentle when you floss. Contact a health care provider if:  You are unsure if you are in labor or if your water has broken.  You become dizzy.  You have mild pelvic cramps, pelvic pressure, or nagging pain in your abdominal area.  You have lower back pain.  You have persistent nausea, vomiting, or  diarrhea.  You have an unusual or bad smelling vaginal discharge.  You have pain when you urinate. Get help right away if:  Your water breaks before 37 weeks.  You have regular contractions less than 5 minutes apart before 37 weeks.  You have a fever.  You are leaking fluid from your vagina.  You have spotting or bleeding from your vagina.  You have severe abdominal pain or cramping.  You have rapid weight loss or weight gain.  You have   shortness of breath with chest pain.  You notice sudden or extreme swelling of your face, hands, ankles, feet, or legs.  Your baby makes fewer than 10 movements in 2 hours.  You have severe headaches that do not go away when you take medicine.  You have vision changes. Summary  The third trimester is from week 28 through week 40, months 7 through 9. The third trimester is a time when the unborn baby (fetus) is growing rapidly.  During the third trimester, your discomfort may increase as you and your baby continue to gain weight. You may have abdominal, leg, and back pain, sleeping problems, and an increased need to urinate.  During the third trimester your breasts will keep growing and they will continue to become tender. A yellow fluid (colostrum) may leak from your breasts. This is the first milk you are producing for your baby.  False labor is a condition in which you feel small, irregular tightenings of the muscles in the womb (contractions) that eventually go away. These are called Braxton Hicks contractions. Contractions may last for hours, days, or even weeks before true labor sets in.  Signs of labor can include: abdominal cramps; regular contractions that start at 10 minutes apart and become stronger and more frequent with time; watery or bloody mucus discharge that comes from the vagina; increased pelvic pressure and dull back pain; and leaking of amniotic fluid. This information is not intended to replace advice given to you by your  health care provider. Make sure you discuss any questions you have with your health care provider. Document Released: 02/16/2001 Document Revised: 03/30/2016 Document Reviewed: 03/30/2016 Elsevier Interactive Patient Education  2019 [employer]  

## 2018-05-04 ENCOUNTER — Other Ambulatory Visit: Payer: Self-pay

## 2018-05-04 DIAGNOSIS — O409XX0 Polyhydramnios, unspecified trimester, not applicable or unspecified: Secondary | ICD-10-CM

## 2018-05-08 ENCOUNTER — Ambulatory Visit
Admission: RE | Admit: 2018-05-08 | Discharge: 2018-05-08 | Disposition: A | Discharge status: Home / Self Care | Payer: Medicaid Other | Source: Ambulatory Visit | Attending: Obstetrics and Gynecology | Admitting: Obstetrics and Gynecology

## 2018-05-08 ENCOUNTER — Ambulatory Visit: Admission: RE | Admit: 2018-05-08 | Payer: Medicaid Other | Source: Ambulatory Visit

## 2018-05-08 ENCOUNTER — Ambulatory Visit: Payer: Medicaid Other

## 2018-05-08 DIAGNOSIS — O26893 Other specified pregnancy related conditions, third trimester: Secondary | ICD-10-CM | POA: Diagnosis not present

## 2018-05-08 DIAGNOSIS — Z3A32 32 weeks gestation of pregnancy: Secondary | ICD-10-CM | POA: Insufficient documentation

## 2018-05-08 DIAGNOSIS — O409XX0 Polyhydramnios, unspecified trimester, not applicable or unspecified: Secondary | ICD-10-CM

## 2018-05-08 DIAGNOSIS — O403XX1 Polyhydramnios, third trimester, fetus 1: Secondary | ICD-10-CM | POA: Diagnosis not present

## 2018-05-08 NOTE — ED Notes (Signed)
On arrival to [facility] today pt requested to speak with the MFM without her sig other present about her pain concerns.  Pt stated pain 7/10 in both her lower abd and rib area.  The rib area contributing to most of the pain.  See Epic for VS.  Dr. Zack Seal spoke with pt about her concerns prior to Sig other coming in for her scheduled [place] today.

## 2018-05-10 ENCOUNTER — Other Ambulatory Visit: Payer: Self-pay | Admitting: Maternal Newborn

## 2018-05-12 ENCOUNTER — Ambulatory Visit (INDEPENDENT_AMBULATORY_CARE_PROVIDER_SITE_OTHER): Payer: Medicaid Other

## 2018-05-12 ENCOUNTER — Ambulatory Visit (INDEPENDENT_AMBULATORY_CARE_PROVIDER_SITE_OTHER): Payer: Medicaid Other | Admitting: Maternal Newborn

## 2018-05-12 ENCOUNTER — Encounter: Payer: Self-pay | Admitting: Maternal Newborn

## 2018-05-12 VITALS — BP 142/80 | Wt 158.0 lb

## 2018-05-12 DIAGNOSIS — Z3A32 32 weeks gestation of pregnancy: Secondary | ICD-10-CM

## 2018-05-12 DIAGNOSIS — O409XX0 Polyhydramnios, unspecified trimester, not applicable or unspecified: Secondary | ICD-10-CM | POA: Diagnosis not present

## 2018-05-12 DIAGNOSIS — O26893 Other specified pregnancy related conditions, third trimester: Secondary | ICD-10-CM | POA: Diagnosis not present

## 2018-05-12 DIAGNOSIS — Z369 Encounter for antenatal screening, unspecified: Secondary | ICD-10-CM

## 2018-05-12 DIAGNOSIS — R109 Unspecified abdominal pain: Secondary | ICD-10-CM

## 2018-05-12 DIAGNOSIS — O0990 Supervision of high risk pregnancy, unspecified, unspecified trimester: Secondary | ICD-10-CM

## 2018-05-12 LAB — POCT URINALYSIS DIPSTICK OB
GLUCOSE, UA: NEGATIVE
POC,PROTEIN,UA: NEGATIVE

## 2018-05-12 NOTE — Patient Instructions (Signed)
Third Trimester of Pregnancy The third trimester is from week 28 through week 40 (months 7 through 9). The third trimester is a time when the unborn baby (fetus) is growing rapidly. At the end of the ninth month, the fetus is about 20 inches in length and weighs 6-10 pounds. Body changes during your third trimester Your body will continue to go through many changes during pregnancy. The changes vary from woman to woman. During the third trimester:  Your weight will continue to increase. You can expect to gain 25-35 pounds (11-16 kg) by the end of the pregnancy.  You may begin to get stretch marks on your hips, abdomen, and breasts.  You may urinate more often because the fetus is moving lower into your pelvis and pressing on your bladder.  You may develop or continue to have heartburn. This is caused by increased hormones that slow down muscles in the digestive tract.  You may develop or continue to have constipation because increased hormones slow digestion and cause the muscles that push waste through your intestines to relax.  You may develop hemorrhoids. These are swollen veins (varicose veins) in the rectum that can itch or be painful.  You may develop swollen, bulging veins (varicose veins) in your legs.  You may have increased body aches in the pelvis, back, or thighs. This is due to weight gain and increased hormones that are relaxing your joints.  You may have changes in your hair. These can include thickening of your hair, rapid growth, and changes in texture. Some women also have hair loss during or after pregnancy, or hair that feels dry or thin. Your hair will most likely return to normal after your baby is born.  Your breasts will continue to grow and they will continue to become tender. A yellow fluid (colostrum) may leak from your breasts. This is the first milk you are producing for your baby.  Your belly button may stick out.  You may notice more swelling in your hands,  face, or ankles.  You may have increased tingling or numbness in your hands, arms, and legs. The skin on your belly may also feel numb.  You may feel short of breath because of your expanding uterus.  You may have more problems sleeping. This can be caused by the size of your belly, increased need to urinate, and an increase in your body's metabolism.  You may notice the fetus "dropping," or moving lower in your abdomen (lightening).  You may have increased vaginal discharge.  You may notice your joints feel loose and you may have pain around your pelvic bone. What to expect at prenatal visits You will have prenatal exams every 2 weeks until week 36. Then you will have weekly prenatal exams. During a routine prenatal visit:  You will be weighed to make sure you and the baby are growing normally.  Your blood pressure will be taken.  Your abdomen will be measured to track your baby's growth.  The fetal heartbeat will be listened to.  Any test results from the previous visit will be discussed.  You may have a cervical check near your due date to see if your cervix has softened or thinned (effaced).  You will be tested for Group B streptococcus. This happens between 35 and 37 weeks. Your health care provider may ask you:  What your birth plan is.  How you are feeling.  If you are feeling the baby move.  If you have had any abnormal   symptoms, such as leaking fluid, bleeding, severe headaches, or abdominal cramping.  If you are using any tobacco products, including cigarettes, chewing tobacco, and electronic cigarettes.  If you have any questions. Other tests or screenings that may be performed during your third trimester include:  Blood tests that check for low iron levels (anemia).  Fetal testing to check the health, activity level, and growth of the fetus. Testing is done if you have certain medical conditions or if there are problems during the pregnancy.  Nonstress test  (NST). This test checks the health of your baby to make sure there are no signs of problems, such as the baby not getting enough oxygen. During this test, a belt is placed around your belly. The baby is made to move, and its heart rate is monitored during movement. What is false labor? False labor is a condition in which you feel small, irregular tightenings of the muscles in the womb (contractions) that usually go away with rest, changing position, or drinking water. These are called Braxton Hicks contractions. Contractions may last for hours, days, or even weeks before true labor sets in. If contractions come at regular intervals, become more frequent, increase in intensity, or become painful, you should see your health care provider. What are the signs of labor?  Abdominal cramps.  Regular contractions that start at 10 minutes apart and become stronger and more frequent with time.  Contractions that start on the top of the uterus and spread down to the lower abdomen and back.  Increased pelvic pressure and dull back pain.  A watery or bloody mucus discharge that comes from the vagina.  Leaking of amniotic fluid. This is also known as your "water breaking." It could be a slow trickle or a gush. Let your health care provider know if it has a color or strange odor. If you have any of these signs, call your health care provider right away, even if it is before your due date. Follow these instructions at home: Medicines  Follow your health care provider's instructions regarding medicine use. Specific medicines may be either safe or unsafe to take during pregnancy.  Take a prenatal vitamin that contains at least 600 micrograms (mcg) of folic acid.  If you develop constipation, try taking a stool softener if your health care provider approves. Eating and drinking   Eat a balanced diet that includes fresh fruits and vegetables, whole grains, good sources of protein such as meat, eggs, or tofu,  and low-fat dairy. Your health care provider will help you determine the amount of weight gain that is right for you.  Avoid raw meat and uncooked cheese. These carry germs that can cause birth defects in the baby.  If you have low calcium intake from food, talk to your health care provider about whether you should take a daily calcium supplement.  Eat four or five small meals rather than three large meals a day.  Limit foods that are high in fat and processed sugars, such as fried and sweet foods.  To prevent constipation: ? Drink enough fluid to keep your urine clear or pale yellow. ? Eat foods that are high in fiber, such as fresh fruits and vegetables, whole grains, and beans. Activity  Exercise only as directed by your health care provider. Most women can continue their usual exercise routine during pregnancy. Try to exercise for 30 minutes at least 5 days a week. Stop exercising if you experience uterine contractions.  Avoid heavy lifting.  Do   not exercise in extreme heat or humidity, or at high altitudes.  Wear low-heel, comfortable shoes.  Practice good posture.  You may continue to have sex unless your health care provider tells you otherwise. Relieving pain and discomfort  Take frequent breaks and rest with your legs elevated if you have leg cramps or low back pain.  Take warm sitz baths to soothe any pain or discomfort caused by hemorrhoids. Use hemorrhoid cream if your health care provider approves.  Wear a good support bra to prevent discomfort from breast tenderness.  If you develop varicose veins: ? Wear support pantyhose or compression stockings as told by your healthcare provider. ? Elevate your feet for 15 minutes, 3-4 times a day. Prenatal care  Write down your questions. Take them to your prenatal visits.  Keep all your prenatal visits as told by your health care provider. This is important. Safety  Wear your seat belt at all times when driving.  Make  a list of emergency phone numbers, including numbers for family, friends, the hospital, and police and fire departments. General instructions  Avoid cat litter boxes and soil used by cats. These carry germs that can cause birth defects in the baby. If you have a cat, ask someone to clean the litter box for you.  Do not travel far distances unless it is absolutely necessary and only with the approval of your health care provider.  Do not use hot tubs, steam rooms, or saunas.  Do not drink alcohol.  Do not use any products that contain nicotine or tobacco, such as cigarettes and e-cigarettes. If you need help quitting, ask your health care provider.  Do not use any medicinal herbs or unprescribed drugs. These chemicals affect the formation and growth of the baby.  Do not douche or use tampons or scented sanitary pads.  Do not cross your legs for long periods of time.  To prepare for the arrival of your baby: ? Take prenatal classes to understand, practice, and ask questions about labor and delivery. ? Make a trial run to the hospital. ? Visit the hospital and tour the maternity area. ? Arrange for maternity or paternity leave through employers. ? Arrange for family and friends to take care of pets while you are in the hospital. ? Purchase a rear-facing car seat and make sure you know how to install it in your car. ? Pack your hospital bag. ? Prepare the baby's nursery. Make sure to remove all pillows and stuffed animals from the baby's crib to prevent suffocation.  Visit your dentist if you have not gone during your pregnancy. Use a soft toothbrush to brush your teeth and be gentle when you floss. Contact a health care provider if:  You are unsure if you are in labor or if your water has broken.  You become dizzy.  You have mild pelvic cramps, pelvic pressure, or nagging pain in your abdominal area.  You have lower back pain.  You have persistent nausea, vomiting, or  diarrhea.  You have an unusual or bad smelling vaginal discharge.  You have pain when you urinate. Get help right away if:  Your water breaks before 37 weeks.  You have regular contractions less than 5 minutes apart before 37 weeks.  You have a fever.  You are leaking fluid from your vagina.  You have spotting or bleeding from your vagina.  You have severe abdominal pain or cramping.  You have rapid weight loss or weight gain.  You have   shortness of breath with chest pain.  You notice sudden or extreme swelling of your face, hands, ankles, feet, or legs.  Your baby makes fewer than 10 movements in 2 hours.  You have severe headaches that do not go away when you take medicine.  You have vision changes. Summary  The third trimester is from week 28 through week 40, months 7 through 9. The third trimester is a time when the unborn baby (fetus) is growing rapidly.  During the third trimester, your discomfort may increase as you and your baby continue to gain weight. You may have abdominal, leg, and back pain, sleeping problems, and an increased need to urinate.  During the third trimester your breasts will keep growing and they will continue to become tender. A yellow fluid (colostrum) may leak from your breasts. This is the first milk you are producing for your baby.  False labor is a condition in which you feel small, irregular tightenings of the muscles in the womb (contractions) that eventually go away. These are called Braxton Hicks contractions. Contractions may last for hours, days, or even weeks before true labor sets in.  Signs of labor can include: abdominal cramps; regular contractions that start at 10 minutes apart and become stronger and more frequent with time; watery or bloody mucus discharge that comes from the vagina; increased pelvic pressure and dull back pain; and leaking of amniotic fluid. This information is not intended to replace advice given to you by your  health care provider. Make sure you discuss any questions you have with your health care provider. Document Released: 02/16/2001 Document Revised: 03/30/2016 Document Reviewed: 03/30/2016 Elsevier Interactive Patient Education  2019 [employer]  

## 2018-05-12 NOTE — Progress Notes (Signed)
ROB/AFI/NST- 118/80 second BP reading

## 2018-05-12 NOTE — Progress Notes (Signed)
Routine Prenatal Care Visit  Subjective  Tamara Flynn is a 23 y.o. G1P0000 at [redacted]w[redacted]d being seen today for ongoing prenatal care.  She is currently monitored for the following issues for this high-risk pregnancy and has Supervision of high risk pregnancy, antepartum; Abdominal pain during pregnancy in second trimester; and Polyhydramnios affecting pregnancy on their problem list.  ----------------------------------------------------------------------------------- Patient reports carpal tunnel symptoms.   Contractions: Not present. Vag. Bleeding: None.  Movement: Present. No leaking of fluid.  ----------------------------------------------------------------------------------- The following portions of the patient's history were reviewed and updated as appropriate: allergies, current medications, past family history, past medical history, past social history, past surgical history and problem list. Problem list updated.  Objective  Blood pressure (!) 142/80, weight 158 lb (71.7 kg), last menstrual period 09/24/2017. Pregravid weight 127 lb (57.6 kg) Total Weight Gain 31 lb (14.1 kg) Body mass index is 31.91 kg/m.   Urinalysis: Urine dipstick shows negative for glucose, protein.  Fetal Status: Fetal Heart Rate (bpm): 135 Fundal Height: 34 cm Movement: Present     General:  Alert, oriented and cooperative. Patient is in no acute distress.  Skin: Skin is warm and dry. No rash noted.   Cardiovascular: Normal heart rate noted  Respiratory: Normal respiratory effort, no problems with respiration noted  Abdomen: Soft, gravid, appropriate for gestational age. Pain/Pressure: Present     Pelvic:  Cervical exam deferred        Extremities: Normal range of motion.  Edema: None  Mental Status: Normal mood and affect. Normal behavior. Normal judgment and thought content.   NST Baseline: 135 Variability: moderate Accelerations: present Decelerations: absent Tocometry: not done The patient  was monitored for 20+ minutes, fetal heart rate tracing was deemed reactive.  Assessment   23 y.o. G1P0000 at [redacted]w[redacted]d, EDD 07/01/2018 by Last Menstrual Period presenting for a routine prenatal visit.  Plan   FIRST Problems (from 11/18/17 to present)    Problem Noted Resolved   Abdominal pain during pregnancy in second trimester 03/13/2018 by Rod Can, CNM No   Supervision of high risk pregnancy, antepartum 11/18/2017 by Rexene Agent, CNM No   Overview Addendum 11/28/2017 11:01 AM by Homero Fellers, MD    [facility]inic [facility] Prenatal Labs  Dating  LMP=9wk [place] Blood type: B/Positive/-- (09/13 1055)   Genetic Screen 1 Screen:    AFP:     Quad:     NIPS: Antibody:Negative (09/13 1055)  Anatomic [place]  Rubella: <0.90 (09/13 1055) Varicella: Non-immune  GTT Early:               Third trimester:  RPR: Non Reactive (09/13 1055)   Rhogam  not needed HBsAg: Negative (09/13 1055)   TDaP vaccine                        Flu Shot: Declines HIV: Non Reactive (09/13 1055)   Baby Food                                GBS:   Contraception  Pap: 10/2017, NILM per patient report  CBB     CS/VBAC    Support Person               Reactive NST. AFI today was 20.6 and baby is cephalic!  Discussed that polyhydramnios seems to have resolved as her AFI was also normal at [facility] visit on 3/2. Discussed  with MD and we can discontinue APT for now.  BP recheck today 118/80.  She has been writing papers and using keyboard and mouse a lot, which has aggravated carpal tunnel symptoms. Soaking and Tylenol have helped, and she is searching for more comfortable braces to wear at night.  Please refer to After Visit Summary for other counseling recommendations.   Return in about 2 weeks (around 05/26/2018) for ROB.  Avel Sensor, CNM 05/12/2018

## 2018-05-18 ENCOUNTER — Encounter: Payer: Medicaid Other | Admitting: Maternal Newborn

## 2018-05-26 ENCOUNTER — Ambulatory Visit (INDEPENDENT_AMBULATORY_CARE_PROVIDER_SITE_OTHER): Payer: Medicaid Other | Admitting: Advanced Practice Midwife

## 2018-05-26 ENCOUNTER — Other Ambulatory Visit: Payer: Self-pay

## 2018-05-26 ENCOUNTER — Encounter: Payer: Self-pay | Admitting: Advanced Practice Midwife

## 2018-05-26 VITALS — BP 120/80 | Wt 164.0 lb

## 2018-05-26 DIAGNOSIS — Z3A34 34 weeks gestation of pregnancy: Secondary | ICD-10-CM

## 2018-05-26 DIAGNOSIS — O0990 Supervision of high risk pregnancy, unspecified, unspecified trimester: Secondary | ICD-10-CM

## 2018-05-26 DIAGNOSIS — O0993 Supervision of high risk pregnancy, unspecified, third trimester: Secondary | ICD-10-CM

## 2018-05-26 LAB — POCT URINALYSIS DIPSTICK OB
Glucose, UA: NEGATIVE
POC,PROTEIN,UA: NEGATIVE

## 2018-05-26 NOTE — Patient Instructions (Signed)
Braxton Hicks Contractions Contractions of the uterus can occur throughout pregnancy, but they are not always a sign that you are in labor. You may have practice contractions called Braxton Hicks contractions. These false labor contractions are sometimes confused with true labor. What are Braxton Hicks contractions? Braxton Hicks contractions are tightening movements that occur in the muscles of the uterus before labor. Unlike true labor contractions, these contractions do not result in opening (dilation) and thinning of the cervix. Toward the end of pregnancy (32-34 weeks), Braxton Hicks contractions can happen more often and may become stronger. These contractions are sometimes difficult to tell apart from true labor because they can be very uncomfortable. You should not feel embarrassed if you go to the hospital with false labor. Sometimes, the only way to tell if you are in true labor is for your health care provider to look for changes in the cervix. The health care provider will do a physical exam and may monitor your contractions. If you are not in true labor, the exam should show that your cervix is not dilating and your water has not broken. If there are no other health problems associated with your pregnancy, it is completely safe for you to be sent home with false labor. You may continue to have Braxton Hicks contractions until you go into true labor. How to tell the difference between true labor and false labor True labor  Contractions last 30-70 seconds.  Contractions become very regular.  Discomfort is usually felt in the top of the uterus, and it spreads to the lower abdomen and low back.  Contractions do not go away with walking.  Contractions usually become more intense and increase in frequency.  The cervix dilates and gets thinner. False labor  Contractions are usually shorter and not as strong as true labor contractions.  Contractions are usually irregular.  Contractions  are often felt in the front of the lower abdomen and in the groin.  Contractions may go away when you walk around or change positions while lying down.  Contractions get weaker and are shorter-lasting as time goes on.  The cervix usually does not dilate or become thin. Follow these instructions at home:   Take over-the-counter and prescription medicines only as told by your health care provider.  Keep up with your usual exercises and follow other instructions from your health care provider.  Eat and drink lightly if you think you are going into labor.  If Braxton Hicks contractions are making you uncomfortable: ? Change your position from lying down or resting to walking, or change from walking to resting. ? Sit and rest in a tub of warm water. ? Drink enough fluid to keep your urine pale yellow. Dehydration may cause these contractions. ? Do slow and deep breathing several times an hour.  Keep all follow-up prenatal visits as told by your health care provider. This is important. Contact a health care provider if:  You have a fever.  You have continuous pain in your abdomen. Get help right away if:  Your contractions become stronger, more regular, and closer together.  You have fluid leaking or gushing from your vagina.  You pass blood-tinged mucus (bloody show).  You have bleeding from your vagina.  You have low back pain that you never had before.  You feel your baby's head pushing down and causing pelvic pressure.  Your baby is not moving inside you as much as it used to. Summary  Contractions that occur before labor are   called Braxton Hicks contractions, false labor, or practice contractions.  Braxton Hicks contractions are usually shorter, weaker, farther apart, and less regular than true labor contractions. True labor contractions usually become progressively stronger and regular, and they become more frequent.  Manage discomfort from Braxton Hicks contractions  by changing position, resting in a warm bath, drinking plenty of water, or practicing deep breathing. This information is not intended to replace advice given to you by your health care provider. Make sure you discuss any questions you have with your health care provider. Document Released: 07/08/2016 Document Revised: 12/07/2016 Document Reviewed: 07/08/2016 Elsevier Interactive Patient Education  2019 [employer]  

## 2018-05-26 NOTE — Progress Notes (Signed)
Routine Prenatal Care Visit  Subjective  Tamara Flynn is a 23 y.o. G1P0000 at [redacted]w[redacted]d being seen today for ongoing prenatal care.  She is currently monitored for the following issues for this high-risk pregnancy and has Supervision of high risk pregnancy, antepartum; Abdominal pain during pregnancy in second trimester; and Polyhydramnios affecting pregnancy on their problem list.  ----------------------------------------------------------------------------------- Patient reports occasional contractions.  She feels like she has gotten a lot bigger in the past 2 weeks. Contractions: Irregular. Vag. Bleeding: None.  Movement: Present. Denies leaking of fluid.  ----------------------------------------------------------------------------------- The following portions of the patient's history were reviewed and updated as appropriate: allergies, current medications, past family history, past medical history, past social history, past surgical history and problem list. Problem list updated.   Objective  Blood pressure 120/80, weight 164 lb (74.4 kg), last menstrual period 09/24/2017. Pregravid weight 127 lb (57.6 kg) Total Weight Gain 37 lb (16.8 kg) Urinalysis: Urine Protein    Urine Glucose    Fetal Status: Fetal Heart Rate (bpm): 144 Fundal Height: 37 cm Movement: Present     General:  Alert, oriented and cooperative. Patient is in no acute distress.  Skin: Skin is warm and dry. No rash noted.   Cardiovascular: Normal heart rate noted  Respiratory: Normal respiratory effort, no problems with respiration noted  Abdomen: Soft, gravid, appropriate for gestational age. Pain/Pressure: Present     Pelvic:  Cervical exam deferred        Extremities: Normal range of motion.  Edema: None  Mental Status: Normal mood and affect. Normal behavior. Normal judgment and thought content.   Assessment   23 y.o. G1P0000 at [redacted]w[redacted]d by  07/01/2018, by Last Menstrual Period presenting for routine prenatal visit   Plan   FIRST Problems (from 11/18/17 to present)    Problem Noted Resolved   Abdominal pain during pregnancy in second trimester 03/13/2018 by Rod Can, CNM No   Supervision of high risk pregnancy, antepartum 11/18/2017 by Rexene Agent, CNM No   Overview Addendum 11/28/2017 11:01 AM by Homero Fellers, MD    [facility]inic [facility] Prenatal Labs  Dating  LMP=9wk [place] Blood type: B/Positive/-- (09/13 1055)   Genetic Screen 1 Screen:    AFP:     Quad:     NIPS: Antibody:Negative (09/13 1055)  Anatomic [place]  Rubella: <0.90 (09/13 1055) Varicella: Non-immune  GTT Early:               Third trimester:  RPR: Non Reactive (09/13 1055)   Rhogam  not needed HBsAg: Negative (09/13 1055)   TDaP vaccine                        Flu Shot: Declines HIV: Non Reactive (09/13 1055)   Baby Food                                GBS:   Contraception  Pap: 10/2017, NILM per patient report  CBB     CS/VBAC    Support Person                  Preterm labor symptoms and general obstetric precautions including but not limited to vaginal bleeding, contractions, leaking of fluid and fetal movement were reviewed in detail with the patient. Please refer to After Visit Summary for other counseling recommendations.   Given history of polyhydramnios with the pregnancy and increased fundal height,  will check AFI at next visit.  Return in about 2 weeks (around 06/09/2018) for afi and rob.  Rod Can, CNM 05/26/2018 3:58 PM

## 2018-06-07 ENCOUNTER — Other Ambulatory Visit: Payer: Self-pay

## 2018-06-07 ENCOUNTER — Encounter: Payer: Self-pay | Admitting: Maternal Newborn

## 2018-06-07 ENCOUNTER — Other Ambulatory Visit (HOSPITAL_COMMUNITY)
Admission: RE | Admit: 2018-06-07 | Discharge: 2018-06-07 | Disposition: A | Discharge status: Home / Self Care | Payer: Medicaid Other | Source: Ambulatory Visit | Attending: Maternal Newborn | Admitting: Maternal Newborn

## 2018-06-07 ENCOUNTER — Ambulatory Visit (INDEPENDENT_AMBULATORY_CARE_PROVIDER_SITE_OTHER): Payer: Medicaid Other | Admitting: Maternal Newborn

## 2018-06-07 ENCOUNTER — Ambulatory Visit (INDEPENDENT_AMBULATORY_CARE_PROVIDER_SITE_OTHER): Payer: Medicaid Other

## 2018-06-07 ENCOUNTER — Observation Stay
Admission: EM | Admit: 2018-06-07 | Discharge: 2018-06-07 | Disposition: A | Discharge status: Home / Self Care | Payer: Medicaid Other | Attending: Certified Nurse Midwife | Admitting: Certified Nurse Midwife

## 2018-06-07 VITALS — BP 140/100 | Wt 169.0 lb

## 2018-06-07 DIAGNOSIS — Z362 Encounter for other antenatal screening follow-up: Secondary | ICD-10-CM

## 2018-06-07 DIAGNOSIS — R109 Unspecified abdominal pain: Secondary | ICD-10-CM

## 2018-06-07 DIAGNOSIS — O0990 Supervision of high risk pregnancy, unspecified, unspecified trimester: Secondary | ICD-10-CM

## 2018-06-07 DIAGNOSIS — O133 Gestational [pregnancy-induced] hypertension without significant proteinuria, third trimester: Principal | ICD-10-CM | POA: Insufficient documentation

## 2018-06-07 DIAGNOSIS — Z3400 Encounter for supervision of normal first pregnancy, unspecified trimester: Secondary | ICD-10-CM

## 2018-06-07 DIAGNOSIS — Z3A36 36 weeks gestation of pregnancy: Secondary | ICD-10-CM

## 2018-06-07 DIAGNOSIS — R030 Elevated blood-pressure reading, without diagnosis of hypertension: Secondary | ICD-10-CM | POA: Diagnosis present

## 2018-06-07 DIAGNOSIS — O4703 False labor before 37 completed weeks of gestation, third trimester: Secondary | ICD-10-CM

## 2018-06-07 DIAGNOSIS — O409XX0 Polyhydramnios, unspecified trimester, not applicable or unspecified: Secondary | ICD-10-CM

## 2018-06-07 DIAGNOSIS — R12 Heartburn: Secondary | ICD-10-CM

## 2018-06-07 DIAGNOSIS — O26899 Other specified pregnancy related conditions, unspecified trimester: Secondary | ICD-10-CM

## 2018-06-07 DIAGNOSIS — O139 Gestational [pregnancy-induced] hypertension without significant proteinuria, unspecified trimester: Secondary | ICD-10-CM | POA: Diagnosis present

## 2018-06-07 DIAGNOSIS — O26893 Other specified pregnancy related conditions, third trimester: Secondary | ICD-10-CM | POA: Insufficient documentation

## 2018-06-07 DIAGNOSIS — Z369 Encounter for antenatal screening, unspecified: Secondary | ICD-10-CM

## 2018-06-07 LAB — CBC
HCT: 34.7 % — ABNORMAL LOW (ref 36.0–46.0)
Hemoglobin: 11.6 g/dL — ABNORMAL LOW (ref 12.0–15.0)
MCH: 28.3 pg (ref 26.0–34.0)
MCHC: 33.4 g/dL (ref 30.0–36.0)
MCV: 84.6 fL (ref 80.0–100.0)
Platelets: 186 10*3/uL (ref 150–400)
RBC: 4.1 MIL/uL (ref 3.87–5.11)
RDW: 13.4 % (ref 11.5–15.5)
WBC: 14.3 10*3/uL — ABNORMAL HIGH (ref 4.0–10.5)
nRBC: 0 % (ref 0.0–0.2)

## 2018-06-07 LAB — POCT URINALYSIS DIPSTICK OB
Glucose, UA: NEGATIVE
POC,PROTEIN,UA: NEGATIVE

## 2018-06-07 LAB — PROTEIN / CREATININE RATIO, URINE
Creatinine, Urine: 30 mg/dL
Total Protein, Urine: <6 mg/dL

## 2018-06-07 LAB — COMPREHENSIVE METABOLIC PANEL
ALT: 28 U/L (ref 0–44)
AST: 34 U/L (ref 15–41)
Albumin: 3.1 g/dL — ABNORMAL LOW (ref 3.5–5.0)
Alkaline Phosphatase: 407 U/L — ABNORMAL HIGH (ref 38–126)
Anion gap: 12 (ref 5–15)
BUN: 6 mg/dL (ref 6–20)
CO2: 17 mmol/L — ABNORMAL LOW (ref 22–32)
Calcium: 8.9 mg/dL (ref 8.9–10.3)
Chloride: 107 mmol/L (ref 98–111)
Creatinine, Ser: 0.75 mg/dL (ref 0.44–1.00)
GFR calc Af Amer: >60 mL/min (ref >60)
GFR calc non Af Amer: >60 mL/min (ref >60)
Glucose, Bld: 85 mg/dL (ref 70–99)
Potassium: 3.7 mmol/L (ref 3.5–5.1)
Sodium: 136 mmol/L (ref 135–145)
Total Bilirubin: 0.8 mg/dL (ref 0.3–1.2)
Total Protein: 7 g/dL (ref 6.5–8.1)

## 2018-06-07 LAB — GROUP B STREP BY PCR: Group B strep by PCR: NEGATIVE

## 2018-06-07 LAB — TYPE AND SCREEN
ABO/RH(D): B POS
Antibody Screen: NEGATIVE

## 2018-06-07 LAB — CHLAMYDIA/NGC RT PCR (ARMC ONLY)
Chlamydia Tr: NOT DETECTED
N gonorrhoeae: NOT DETECTED

## 2018-06-07 MED ORDER — FAMOTIDINE 20 MG PO TABS: 40.0000 mg | ORAL_TABLET | Freq: Two times a day (BID) | ORAL | 3 refills | Status: DC | PRN

## 2018-06-07 MED ORDER — LACTATED RINGERS IV SOLN
INTRAVENOUS | Status: DC
  Administered 2018-06-07: 14:00:00 via INTRAVENOUS

## 2018-06-07 MED ORDER — LACTATED RINGERS IV SOLN
500.0000 mL | INTRAVENOUS | Status: DC | PRN
  Administered 2018-06-07: 500 mL via INTRAVENOUS

## 2018-06-07 MED ORDER — OXYTOCIN BOLUS FROM INFUSION: 500.0000 mL | Freq: Once | INTRAVENOUS | Status: DC

## 2018-06-07 MED ORDER — LIDOCAINE HCL (PF) 1 % IJ SOLN: 30.0000 mL | INTRAMUSCULAR | Status: DC | PRN

## 2018-06-07 MED ORDER — OXYTOCIN 40 UNITS IN NORMAL SALINE INFUSION - SIMPLE MED: 2.5000 [IU]/h | INTRAVENOUS | Status: DC

## 2018-06-07 NOTE — Discharge Instructions (Signed)
Follow up at 1:30 PM on Friday 06/09/2018 for a non stress test and blood pressure check.  Call L&D at [phone] for any concerns Preeclampsia and Eclampsia  Preeclampsia is a serious condition that may develop during pregnancy. It is also called toxemia of pregnancy. This condition causes high blood pressure along with other symptoms, such as swelling and headaches. These symptoms may develop as the condition gets worse. Preeclampsia may occur at 20 weeks of pregnancy or later. Diagnosing and treating preeclampsia early is very important. If not treated early, it can cause serious problems for you and your baby. One problem it can lead to is eclampsia. Eclampsia is a condition that causes muscle jerking or shaking (convulsions or seizures) and other serious problems for the mother. During pregnancy, delivering your baby may be the best treatment for preeclampsia or eclampsia. For most women, preeclampsia and eclampsia symptoms go away after giving birth. In rare cases, a woman may develop preeclampsia after giving birth (postpartum preeclampsia). This usually occurs within 48 hours after childbirth but may occur up to 6 weeks after giving birth. What are the causes? The cause of preeclampsia is not known. What increases the risk? The following risk factors make you more likely to develop preeclampsia:  Being pregnant for the first time.  Having had preeclampsia during a past pregnancy.  Having a family history of preeclampsia.  Having high blood pressure.  Being pregnant with more than one baby.  Being 64 or older.  Being African-American.  Having kidney disease or diabetes.  Having medical conditions such as lupus or blood diseases.  Being very overweight (obese). What are the signs or symptoms? The earliest signs of preeclampsia are:  High blood pressure.  Increased protein in your urine. Your health care provider will check for this at every visit before you give  birth (prenatal visit). Other symptoms that may develop as the condition gets worse include:  Severe headaches.  Sudden weight gain.  Swelling of the hands, face, legs, and feet.  Nausea and vomiting.  Vision problems, such as blurred or double vision.  Numbness in the face, arms, legs, and feet.  Urinating less than usual.  Dizziness.  Slurred speech.  Abdominal pain, especially upper abdominal pain.  Convulsions or seizures. How is this diagnosed? There are no screening tests for preeclampsia. Your health care provider will ask you about symptoms and check for signs of preeclampsia during your prenatal visits. You may also have tests that include:  Urine tests.  Blood tests.  Checking your blood pressure.  Monitoring your babys heart rate.  Ultrasound. How is this treated? You and your health care provider will determine the treatment approach that is best for you. Treatment may include:  Having more frequent prenatal exams to check for signs of preeclampsia, if you have an increased risk for preeclampsia.  Medicine to lower your blood pressure.  Staying in the hospital, if your condition is severe. There, treatment will focus on controlling your blood pressure and the amount of fluids in your body (fluid retention).  Taking medicine (magnesium sulfate) to prevent seizures. This may be given as an injection or through an IV.  Taking a low-dose aspirin during your pregnancy.  Delivering your baby early, if your condition gets worse. You may have your labor started with medicine (induced), or you may have a cesarean delivery. Follow these instructions at home: Eating and drinking   Drink enough fluid to keep your urine pale yellow.  Avoid caffeine.  Lifestyle  Do not use any products that contain nicotine or tobacco, such as cigarettes and e-cigarettes. If you need help quitting, ask your health care provider.  Do not use alcohol or drugs.  Avoid stress  as much as possible. Rest and get plenty of sleep. General instructions  Take over-the-counter and prescription medicines only as told by your health care provider.  When lying down, lie on your left side. This keeps pressure off your major blood vessels.  When sitting or lying down, raise (elevate) your feet. Try putting some pillows underneath your lower legs.  Exercise regularly. Ask your health care provider what kinds of exercise are best for you.  Keep all follow-up and prenatal visits as told by your health care provider. This is important. How is this prevented? There is no known way of preventing preeclampsia or eclampsia from developing. However, to lower your risk of complications and detect problems early:  Get regular prenatal care. Your health care provider may be able to diagnose and treat the condition early.  Maintain a healthy weight. Ask your health care provider for help managing weight gain during pregnancy.  Work with your health care provider to manage any long-term (chronic) health conditions you have, such as diabetes or kidney problems.  You may have tests of your blood pressure and kidney function after giving birth.  Your health care provider may have you take low-dose aspirin during your next pregnancy. Contact a health care provider if:  You have symptoms that your health care provider told you may require more treatment or monitoring, such as: ? Headaches. ? Nausea or vomiting. ? Abdominal pain. ? Dizziness. ? Light-headedness. Get help right away if:  You have severe: ? Abdominal pain. ? Headaches that do not get better. ? Dizziness. ? Vision problems. ? Confusion. ? Nausea or vomiting.  You have any of the following: ? A seizure. ? Sudden, rapid weight gain. ? Sudden swelling in your hands, ankles, or face. ? Trouble moving any part of your body. ? Numbness in any part of your body. ? Trouble speaking. ? Abnormal bleeding.  You  faint. Summary  Preeclampsia is a serious condition that may develop during pregnancy. It is also called toxemia of pregnancy.  This condition causes high blood pressure along with other symptoms, such as swelling and headaches.  Diagnosing and treating preeclampsia early is very important. If not treated early, it can cause serious problems for you and your baby.  Get help right away if you have symptoms that your health care provider told you to watch for. This information is not intended to replace advice given to you by your health care provider. Make sure you discuss any questions you have with your health care provider. Document Released: 02/20/2000 Document Revised: 02/08/2017 Document Reviewed: 09/29/2015 Elsevier Interactive Patient Education  2019 [employer].

## 2018-06-07 NOTE — Progress Notes (Signed)
Routine Prenatal Care Visit  Subjective  Tamara Flynn is a 23 y.o. G1P0000 at [redacted]w[redacted]d being seen today for ongoing prenatal care.  She is currently monitored for the following issues for this high-risk pregnancy and has Supervision of high risk pregnancy, antepartum; Abdominal pain during pregnancy in second trimester; and Polyhydramnios affecting pregnancy on their problem list.  ----------------------------------------------------------------------------------- Patient reports abdominal pain consistent with uterine contractions that began this morning around 0700. She did have some pain last night that was less intense. Denies dysuria. Has bilateral pedal and ankle edema, non-pitting, that does not resolve with rest. Contractions: Regular. Vag. Bleeding: None.  Movement: Present. No leaking of fluid.  ----------------------------------------------------------------------------------- The following portions of the patient's history were reviewed and updated as appropriate: allergies, current medications, past family history, past medical history, past social history, past surgical history and problem list. Problem list updated.  Objective  Blood pressure (!) 140/100, weight 169 lb (76.7 kg), last menstrual period 09/24/2017. Pregravid weight 127 lb (57.6 kg) Total Weight Gain 42 lb (19.1 kg) Body mass index is 34.13 kg/m.   Urinalysis: Urine dipstick shows negative for glucose, protein.  Fetal Status: Fetal Heart Rate (bpm): 145 Fundal Height: 39 cm Movement: Present  Presentation: Vertex  General:  Alert, oriented and cooperative. Patient is mildly distressed with contractions.  Skin: Skin is warm and dry. No rash noted.   Cardiovascular: Normal heart rate noted  Respiratory: Normal respiratory effort, no problems with respiration noted  Abdomen: Gravid. Pain/Pressure: Present     Pelvic:  Cervical exam performed Dilation: Closed Effacement (%): 20 Station: -3  Extremities: Normal  range of motion.  Edema: Trace  Mental Status: Normal mood and affect. Normal behavior. Normal judgment and thought content.   Moderate uterine contractions palpated during exam.  Assessment   22 y.o. G1P0000 at [redacted]w[redacted]d, EDD 07/01/2018 by Last Menstrual Period presenting for a routine prenatal visit.  Plan   FIRST Problems (from 11/18/17 to present)    Problem Noted Resolved   Abdominal pain during pregnancy in second trimester 03/13/2018 by Rod Can, CNM No   Supervision of high risk pregnancy, antepartum 11/18/2017 by Rexene Agent, CNM No   Overview Addendum 11/28/2017 11:01 AM by Homero Fellers, MD    [facility]inic [facility] Prenatal Labs  Dating  LMP=9wk [place] Blood type: B/Positive/-- (09/13 1055)   Genetic Screen 1 Screen:    AFP:     Quad:     NIPS: Antibody:Negative (09/13 1055)  Anatomic [place]  Rubella: <0.90 (09/13 1055) Varicella: Non-immune  GTT Early:               Third trimester:  RPR: Non Reactive (09/13 1055)   Rhogam  not needed HBsAg: Negative (09/13 1055)   TDaP vaccine                        Flu Shot: Declines HIV: Non Reactive (09/13 1055)   Baby Food                                GBS:   Contraception  Pap: 10/2017, NILM per patient report  CBB     CS/VBAC    Support Person               BP recheck 138/96. She will go to labor and delivery for evaluation of preterm contractions and labs for pre-eclampsia.  GBS and  Aptima today. Cervix closed on exam.  Ultrasound today showed growth at 74.1%, EFW 7 lb 4 oz, cephalic position. AFI 19.59 cm.  Please refer to After Visit Summary for other counseling recommendations.   Return in about 1 week (around 06/14/2018) for ROB in person/ultrasound AFI/BP check.  Avel Sensor, CNM 06/07/2018

## 2018-06-07 NOTE — Final Progress Note (Signed)
Physician Final Progress Note  Patient ID: Tamara Flynn MRN: [id number] DOB/AGE: 1995/06/26 23 y.o.  Admit date: 06/07/2018 Admitting provider: Gae Dry, MD/ Jesus Genera. Danise Mina, [place] Discharge date: 06/07/2018   Admission Diagnoses: IUP at 36wk4d  Elevated blood pressure in pregnancy, third trimester Preterm contractions Discharge Diagnoses:  Active Problems:   Gestational hypertension False labor  Consults: None  Significant Findings/ Diagnostic Studies:  Tamara Flynn is a 23 y.o. G33P0000 female with [facility] 07/01/2018 at [redacted]w[redacted]d dated by LMP=9.  Her pregnancy has been complicated by asthma, polyhydraminos and today she had elevated blood pressures in the office of 140/100 and 138/96. Her AFI today was 19.59cm and her EFW: 7#4oz.  She presents to L&D for evaluation of elevated blood pressures and possibly early labor. Cervix in office was closed. Reports contractions were irregular last night and became more regular this AM between 7 and 8 AM, then became more intense at 1000. She denies leakage of fluid and vaginal bleeding Denies headache. Has had some scintillating scotomata for the past month. Has had RUQ pain for months. Had some SOB last night with some contractions. Blood pressures were elevated in the mild range but her [place] labs were all normal. Patient Vitals for the past 24 hrs:  BP Temp Temp src Pulse Resp Height Weight  06/07/18 1655 (!) 145/78 - - (!) 115 - - -  06/07/18 1602 (!) 140/91 - - (!) 103 - - -  06/07/18 1523 (!) 154/97 - - (!) 105 - - -  06/07/18 1300 136/78 - - (!) 107 - - -  06/07/18 1246 (!) 132/93 - - (!) 111 - - -  06/07/18 1215 (!) 139/94 - - (!) 103 - - -  06/07/18 1204 - - - - - 4\' 11"  (1.499 m) 77.1 kg  06/07/18 1200 (!) 147/97 98 F (36.7 C) Oral (!) 110 20 - -  Contractions were every 2-4 minutes apart on arrival and moderate to palpation. She was breathing thru the contractions initially.Marland Kitchen She was given a IV fluid bolus to try to space out  the contractions, and shortly after that the contractions became less intense and less frequent. Her cervix remained closed over 5 hours.  She tolerated a regular diet.  A: IUP at [phone] with gestational hypertension False labor P: Discussed POM with DR Kenton Kingfisher. Will discharge home with preeclampsia precautions and labor precautions Continue daily FKCs Follow up 06/09/2018 at 1:30 at [facility] for blood pressure check and NST Will schedule for IOL at that visit.  Procedures: none  Discharge Condition: stable  Disposition: Discharge disposition: 01-Home or Self Care       Diet: Regular diet  Discharge Activity: Activity as tolerated  Discharge Instructions    Discharge patient   Complete by:  As directed    Discharge disposition:  01-Home or Self Care   Discharge patient date:  06/07/2018     Allergies as of 06/07/2018      Reactions   Ciprocin-fluocin-procin [fluocinolone]       Medication List    TAKE these medications   famotidine 20 MG tablet Commonly known as:  PEPCID Take 2 tablets (40 mg total) by mouth 2 (two) times daily as needed for heartburn or indigestion. What changed:    how much to take  when to take this  reasons to take this   Flovent HFA 220 MCG/ACT inhaler Generic drug:  fluticasone INHALE 1 PUFF WITH SPACER TWICE A DAY FOR ASTHMA   prenatal  multivitamin Tabs tablet Take 1 tablet by mouth daily at 12 noon. GUMMY   ProAir HFA 108 (90 Base) MCG/ACT inhaler Generic drug:  albuterol INHALE 2 PUFFS BY MOUTH EVERY 4 TO 6 HOURS AS NEEDED FOR WHEEZING, COUGH OR SHORTNESS OF BREATH     Follow up appointment 06/09/2018 at 1330 at [place]   Total time spent taking care of this patient: 30 minutes  Signed: Dalia Heading 06/07/2018, 5:16 PM

## 2018-06-07 NOTE — Progress Notes (Signed)
OB History & Physical   History of Present Illness:  Chief Complaint:  Frequent contractions since 7 or 8 AM, becoming more intense at 1000 while at [facility] visit. Elevated blood pressure in office.  HPI:  Tamara Flynn is a 23 y.o. G1P0000 female with [facility] 07/01/2018 at [redacted]w[redacted]d dated by LMP=9.  Her pregnancy has been complicated by asthma, polyhydraminos and today she had elevated blood pressures in the office of 140/100 and 138/96. Her AFI today was 19.59cm and her EFW: 7#4oz.  She presents to L&D for evaluation of elevated blood pressures and possibly early labor. Cervix in office was closed. Reports contractions were irregular last night and became more regular this AM between 7 and 8 AM, then became more intense at 1000. She denies leakage of fluid and vaginal bleeding Denies headache. Has had some scintillating scotomata for the past month. Has had RUQ pain for months. Had some SOB last night with some contractions. Prenatal care site: Prenatal care at [facility] has been remarkable for  [facility] Prenatal Labs  Dating  LMP=9wk [place] Blood type: B/Positive/-- (09/13 1055)   Genetic Screen 1 Screen:    AFP:     Quad:     NIPS: Antibody:Negative (09/13 1055)  Anatomic US Anatomy scan was normal; polyhydramnios was dx 2/21 Rubella: Non Immune Varicella: Non-immune  GTT Early:               Third trimester: 115 RPR: Non Reactive (09/13 1055)   Rhogam  not needed HBsAg: Negative (09/13 1055)   TDaP vaccine       04/28/18                 Flu Shot: Declines HIV: Non Reactive (09/13 1055)   Baby Food                                GBS:   Contraception  Pap: 10/2017, NILM per patient report  CBB     CS/VBAC  A A hemoglobin  Support Person         Total weight gain : 43#    Maternal Medical History:   Past Medical History:  Diagnosis Date  . Abdominal pain during pregnancy in second trimester 03/13/2018  . Asthma     Past Surgical History:  Procedure Laterality Date  . NO PAST  SURGERIES      Allergies  Allergen Reactions  . Ciprocin-Fluocin-Procin [Fluocinolone]     Prior to Admission medications   Medication Sig Start Date End Date Taking? Authorizing Provider  famotidine (PEPCID) 20 MG tablet Take 1 tablet (20 mg total) by mouth 2 (two) times daily. 04/28/18   Rexene Agent, CNM  FLOVENT HFA 220 MCG/ACT inhaler INHALE 1 PUFF WITH SPACER TWICE A DAY FOR ASTHMA 11/10/17   [provider]  Prenatal Vit-Fe Fumarate-FA (PRENATAL MULTIVITAMIN) TABS tablet Take 1 tablet by mouth daily at 12 noon. GUMMY    [provider]  PROAIR HFA 108 (90 Base) MCG/ACT inhaler INHALE 2 PUFFS BY MOUTH EVERY 4 TO 6 HOURS AS NEEDED FOR WHEEZING, COUGH OR SHORTNESS OF BREATH 11/10/17   [provider]     Social History: She  reports that she has never smoked. She has never used smokeless tobacco. She reports that she does not drink alcohol or use drugs.  Family History: family history includes Epilepsy in her father and mother.   Review of  Systems: Negative x 10 systems reviewed except as noted in the HPI.      Physical Exam:  Vital Signs:  Patient Vitals for the past 24 hrs:  BP Temp Temp src Pulse Resp Height Weight  06/07/18 1300 136/78 - - (!) 107 - - -  06/07/18 1246 (!) 132/93 - - (!) 111 - - -  06/07/18 1215 (!) 139/94 - - (!) 103 - - -  06/07/18 1204 - - - - - 4\' 11"  (1.499 m) 77.1 kg  06/07/18 1200 (!) 147/97 98 F (36.7 C) Oral (!) 110 20 - -  General: breathing thru contractions HEENT: normocephalic, atraumatic Heart: mild tachycardia, regular rhythm.  No murmurs/rubs/gallops Lungs: clear to auscultation bilaterally Abdomen: soft, gravid, tender with contractions  EFW: 7 1/2# Pelvic:   External: Normal external female genitalia  Cervix: Dilation: Closed / Effacement (%): 70 / Station: -2   Prominent ischial spines  Extremities: non-tender, symmetric, +1 edema bilaterally.  DTRs: +3/+3, no clonus  Neurologic: Alert & oriented x 3.    Baseline FHR: 135 with accelerations to 150, moderate variability TOCO: contractions every 2-4 minutes with coupling, palpate moderate  Bedside Ultrasound: LOA to LOT   Results for orders placed or performed during the hospital encounter of 06/07/18 (from the past 24 hour(s))  Type and screen [place]     Status: None   Collection Time: 06/07/18 12:25 PM  Result Value Ref Range   ABO/RH(D) B POS    Antibody Screen NEG    Sample Expiration      06/10/2018 Performed at [facility], [address] [place], [place] [address]   CBC     Status: Abnormal   Collection Time: 06/07/18 12:25 PM  Result Value Ref Range   WBC 14.3 (H) 4.0 - 10.5 K/uL   RBC 4.10 3.87 - 5.11 MIL/uL   Hemoglobin 11.6 (L) 12.0 - 15.0 g/dL   HCT 34.7 (L) 36.0 - 46.0 %   MCV 84.6 80.0 - 100.0 fL   MCH 28.3 26.0 - 34.0 pg   MCHC 33.4 30.0 - 36.0 g/dL   RDW 13.4 11.5 - 15.5 %   Platelets 186 150 - 400 K/uL   nRBC 0.0 0.0 - 0.2 %  Protein / creatinine ratio, urine     Status: None   Collection Time: 06/07/18 12:25 PM  Result Value Ref Range   Creatinine, Urine 30 mg/dL   Total Protein, Urine <6 mg/dL   Protein Creatinine Ratio        0.00 - 0.15 mg/mg[Cre]  Chlamydia/NGC rt PCR ([facility] only)     Status: None   Collection Time: 06/07/18 12:25 PM  Result Value Ref Range   Specimen source GC/Chlam URINE, RANDOM    Chlamydia Tr NOT DETECTED NOT DETECTED   N gonorrhoeae NOT DETECTED NOT DETECTED  Group B strep by PCR     Status: None   Collection Time: 06/07/18 12:25 PM  Result Value Ref Range   Group B strep by PCR NEGATIVE NEGATIVE  Comprehensive metabolic panel     Status: Abnormal   Collection Time: 06/07/18 12:25 PM  Result Value Ref Range   Sodium 136 135 - 145 mmol/L   Potassium 3.7 3.5 - 5.1 mmol/L   Chloride 107 98 - 111 mmol/L   CO2 17 (L) 22 - 32 mmol/L   Glucose, Bld 85 70 - 99 mg/dL   BUN 6 6 - 20 mg/dL   Creatinine, Ser 0.75 0.44 -  1.00 mg/dL   Calcium 8.9  8.9 - 10.3 mg/dL   Total Protein 7.0 6.5 - 8.1 g/dL   Albumin 3.1 (L) 3.5 - 5.0 g/dL   AST 34 15 - 41 U/L   ALT 28 0 - 44 U/L   Alkaline Phosphatase 407 (H) 38 - 126 U/L   Total Bilirubin 0.8 0.3 - 1.2 mg/dL   GFR calc non Af Amer >60 >60 mL/min   GFR calc Af Amer >60 >60 mL/min   Anion gap 12 5 - 15   Assessment:  Tamara Flynn is a 23 y.o. G65P0000 female at [redacted]w[redacted]d with probable gestational hypertension Early labor? FWB: Cat 1 tracing Plan:  1. Admit to Labor & Delivery for observation of labor/ GHTN.   2. T&S, RPR also drawn in case of labor 3. GBS negative.   4. Receiving IV bolus of LR to try to space out contractions 5. Clear liquids, and if no cervical change at next check-will order regular diet 6. Blood pressures every 1 hour  Dalia Heading  06/07/2018 2:50 PM   Addendum: Cervix unchanged at 1505. Will order regular diet. Patient is hungry. Contractions seem to be spacing out and she does not appear to be as uncomfortable   Dalia Heading, CNM

## 2018-06-07 NOTE — OB Triage Note (Signed)
Patient sent over from [facility] office for evaluation due to elevated blood pressure in office and worsening contractions.

## 2018-06-07 NOTE — Progress Notes (Signed)
ROB/AFI/GBS today- pain in pelvic and lower back pain since last night  2nd BP reading 138/96

## 2018-06-07 NOTE — Patient Instructions (Signed)
 COVID-19 and Your Pregnancy FAQ  How can I prevent infection with COVID-19 during my pregnancy? Social distancing is key. Please limit any interactions in public. Try and work from home if possible. Frequently wash your hands after touching possibly contaminated surfaces. Avoid touching your face.  Minimize trips to the store. Consider online ordering when possible.   Should I wear a mask? Masks should only be worn by those experiencing symptoms of COVID-19 or those with confirmed COVID-19 when they are in public or around other individuals.  What are the symptoms of COVID-19? Fever (greater than 100.4 F), dry cough, shortness of breath.  Am I more at risk for COVID-19 since I am pregnant? There is not currently data showing that pregnant women are more adversely impacted by COVID-19 than the general population. However, we know that pregnant women tend to have worse respiratory complications from similar diseases such as the flu and SARS and for this reason should be considered an at-risk population.  What do I do if I am experiencing the symptoms of COVID-19? Testing is being limited because of test availability. If you are experiencing symptoms you should quarantine yourself, and the members of your family, for at least 2 weeks at home.   Please visit this website for more information: https://www.cdc.gov/coronavirus/2019-ncov/if-you-are-sick/steps-when-sick.html  When should I go to the Emergency Room? Please go to the emergency room if you are experiencing ANY of these symptoms*:  1.    Difficulty breathing or shortness of breath 2.    Persistent pain or pressure in the chest 3.    Confusion or difficulty being aroused (or awakened) 4.    Bluish lips or face  *This list is not all inclusive. Please consult our office for any other symptoms that are severe or concerning.  What do I do if I am having difficulty breathing? You should go to the Emergency Room for evaluation. At  this time they have a tent set up for evaluating patients with COVID-19 symptoms.   How will my prenatal care be different because of the COVID-19 pandemic? It has been recommended to reduce the frequency of face-to-face visits and use resources such as telephone and virtual visits when possible. Using a scale, blood pressure machine and fetal doppler at home can further help reduce face-to-face visits. You will be provided with additional information on this topic.  We ask that you come to your visits alone to minimize potential exposures to  COVID-19.  How can I receive childbirth education? At this time in-person classes have been cancelled. You can register for online childbirth education, breastfeeding, and newborn care classes.  Please visit:  [email or web address] for more information  How will my hospital birth experience be different? The hospital is currently limiting visitors. This means that while you are in labor you can only have one person at the hospital with you. Additional family members will not be allowed to wait in the building or outside your room. Your one support person can be the father of the baby, a relative, a doula, or a friend. Once one support person is designated that person will wear a band. This band cannot be shared with multiple people.  How long will I stay in the hospital for after giving birth? It is also recommended that discharge home be expedited during the COVID-19 outbreak. This means staying for 1 day after a vaginal delivery and 2 days after a cesarean section.  What if I have COVID-19 and I am in labor? We   ask that you wear a mask while on labor and delivery. We will try and accommodate you being placed in a room that is capable of filtering the air. Please call ahead if you are in labor and on your way to the hospital. The phone number for labor and delivery at [facility] is [phone].  If I have COVID-19 when my baby  is born how can I prevent my baby from contracting COVID-19? This is an issue that will have to be discussed on a case-by-case basis. Current recommendations suggest providing separate isolation rooms for both the mother and new infant as well as limiting visitors. However, there are practical challenges to this recommendation. The situation will assuredly change and decisions will be influenced by the desires of the mother and availability of space.  Some suggestions are the use of a curtain or physical barrier between mom and infant, hand hygiene, mom wearing a mask, or 6 feet of spacing between a mom and infant.   Can I breastfeed during the COVID-19 pandemic?   Yes, breastfeeding is encouraged.  Can I breastfeed if I have COVID-19? Yes. Covid-19 has not been found in breast milk. This means you cannot give COVID-19 to your child through breast milk. Breast feeding will also help pass antibodies to fight infection to your baby.   What precautions should I take when breastfeeding if I have COVID-19? If a mother and newborn do room-in and the mother wishes to feed at the breast, she should put on a facemask and practice hand hygiene before each feeding.  What precautions should I take when pumping if I have COVID-19? Prior to expressing breast milk, mothers should practice hand hygiene. After each pumping session, all parts that come into contact with breast milk should be thoroughly washed and the entire pump should be appropriately disinfected per the manufacturer's instructions. This expressed breast milk should be fed to the newborn by a healthy caregiver.  What if I am pregnant and work in healthcare? Based on limited data regarding COVID-19 and pregnancy, ACOG currently does not propose creating additional restrictions on pregnant health care personnel because of COVID-19 alone. Pregnant women do not appear to be at higher risk of severe disease related to COVID-19. Pregnant health care  personnel should follow CDC risk assessment and infection control guidelines for health care personnel exposed to patients with suspected or confirmed COVID-19. Adherence to recommended infection prevention and control practices is an important part of protecting all health care personnel in health care settings.    Information on COVID-19 in pregnancy is very limited; however, facilities may want to consider limiting exposure of pregnant health care personnel to patients with confirmed or suspected COVID-19 infection, especially during higher-risk procedures (eg, aerosol-generating procedures), if feasible, based on staffing availability.

## 2018-06-07 NOTE — Addendum Note (Signed)
Addended by: Avel Sensor on: 06/07/2018 11:40 AM   Modules accepted: Orders

## 2018-06-08 ENCOUNTER — Telehealth: Payer: Self-pay

## 2018-06-08 LAB — CERVICOVAGINAL ANCILLARY ONLY
Chlamydia: NEGATIVE
Neisseria Gonorrhea: NEGATIVE

## 2018-06-08 LAB — RPR: RPR Ser Ql: NONREACTIVE

## 2018-06-08 NOTE — Progress Notes (Signed)
L&D Charge Nurse M. Edge left telephone voicemail to follow-up on whether or not Tamara Flynn was planning on coming in to be seen today via the ED.  Left L&D phone number and asked to return call.

## 2018-06-08 NOTE — Telephone Encounter (Signed)
[place] called pt re appt tomorrow; pt stated she's not going to be able to make it; pain lower back wraps around to front; pain so bad she can't walk; pt crying; it's been going on all night.  Adv to go to L&D via ED.  To get someone else to drive or call EMS.  Abigail Butts notified.

## 2018-06-09 ENCOUNTER — Observation Stay
Admission: EM | Admit: 2018-06-09 | Discharge: 2018-06-09 | Disposition: A | Discharge status: Home / Self Care | Payer: Medicaid Other | Source: Home / Self Care | Admitting: Obstetrics and Gynecology

## 2018-06-09 ENCOUNTER — Other Ambulatory Visit: Payer: Self-pay

## 2018-06-09 ENCOUNTER — Encounter: Payer: Self-pay | Admitting: *Deleted

## 2018-06-09 ENCOUNTER — Encounter: Payer: Medicaid Other | Admitting: Certified Nurse Midwife

## 2018-06-09 ENCOUNTER — Inpatient Hospital Stay
Admission: EM | Admit: 2018-06-09 | Discharge: 2018-06-14 | DRG: 786 | Disposition: A | Discharge status: Home / Self Care | Payer: Medicaid Other | Attending: Obstetrics & Gynecology | Admitting: Obstetrics & Gynecology

## 2018-06-09 DIAGNOSIS — O411230 Chorioamnionitis, third trimester, not applicable or unspecified: Secondary | ICD-10-CM | POA: Diagnosis present

## 2018-06-09 DIAGNOSIS — O134 Gestational [pregnancy-induced] hypertension without significant proteinuria, complicating childbirth: Secondary | ICD-10-CM | POA: Diagnosis present

## 2018-06-09 DIAGNOSIS — O479 False labor, unspecified: Secondary | ICD-10-CM | POA: Diagnosis present

## 2018-06-09 DIAGNOSIS — J45909 Unspecified asthma, uncomplicated: Secondary | ICD-10-CM | POA: Diagnosis present

## 2018-06-09 DIAGNOSIS — Z3A36 36 weeks gestation of pregnancy: Secondary | ICD-10-CM

## 2018-06-09 DIAGNOSIS — O0990 Supervision of high risk pregnancy, unspecified, unspecified trimester: Secondary | ICD-10-CM

## 2018-06-09 DIAGNOSIS — O1415 Severe pre-eclampsia, complicating the puerperium: Secondary | ICD-10-CM | POA: Diagnosis not present

## 2018-06-09 DIAGNOSIS — O403XX0 Polyhydramnios, third trimester, not applicable or unspecified: Secondary | ICD-10-CM | POA: Diagnosis present

## 2018-06-09 DIAGNOSIS — O9952 Diseases of the respiratory system complicating childbirth: Secondary | ICD-10-CM | POA: Diagnosis present

## 2018-06-09 DIAGNOSIS — Z3A37 37 weeks gestation of pregnancy: Secondary | ICD-10-CM | POA: Diagnosis not present

## 2018-06-09 DIAGNOSIS — O9081 Anemia of the puerperium: Secondary | ICD-10-CM | POA: Diagnosis not present

## 2018-06-09 DIAGNOSIS — O4703 False labor before 37 completed weeks of gestation, third trimester: Secondary | ICD-10-CM | POA: Insufficient documentation

## 2018-06-09 DIAGNOSIS — D62 Acute posthemorrhagic anemia: Secondary | ICD-10-CM | POA: Diagnosis not present

## 2018-06-09 DIAGNOSIS — O219 Vomiting of pregnancy, unspecified: Secondary | ICD-10-CM | POA: Diagnosis present

## 2018-06-09 DIAGNOSIS — Z888 Allergy status to other drugs, medicaments and biological substances status: Secondary | ICD-10-CM

## 2018-06-09 LAB — PROTEIN / CREATININE RATIO, URINE
Creatinine, Urine: 36 mg/dL
Total Protein, Urine: <6 mg/dL

## 2018-06-09 LAB — CBC
HCT: 38.1 % (ref 36.0–46.0)
Hemoglobin: 12.5 g/dL (ref 12.0–15.0)
MCH: 28 pg (ref 26.0–34.0)
MCHC: 32.8 g/dL (ref 30.0–36.0)
MCV: 85.4 fL (ref 80.0–100.0)
Platelets: 146 10*3/uL — ABNORMAL LOW (ref 150–400)
RBC: 4.46 MIL/uL (ref 3.87–5.11)
RDW: 13.7 % (ref 11.5–15.5)
WBC: 11.3 10*3/uL — ABNORMAL HIGH (ref 4.0–10.5)
nRBC: 0 % (ref 0.0–0.2)

## 2018-06-09 LAB — URINE DRUG SCREEN, QUALITATIVE (ARMC ONLY)
Amphetamines, Ur Screen: NOT DETECTED
Barbiturates, Ur Screen: NOT DETECTED
Benzodiazepine, Ur Scrn: NOT DETECTED
Cannabinoid 50 Ng, Ur ~~LOC~~: NOT DETECTED
Cocaine Metabolite,Ur ~~LOC~~: NOT DETECTED
MDMA (Ecstasy)Ur Screen: NOT DETECTED
Methadone Scn, Ur: NOT DETECTED
Opiate, Ur Screen: NOT DETECTED
Phencyclidine (PCP) Ur S: NOT DETECTED
Tricyclic, Ur Screen: NOT DETECTED

## 2018-06-09 LAB — TYPE AND SCREEN
ABO/RH(D): B POS
Antibody Screen: NEGATIVE

## 2018-06-09 LAB — RAPID HIV SCREEN (HIV 1/2 AB+AG)
HIV 1/2 Antibodies: NONREACTIVE
HIV-1 P24 Antigen - HIV24: NONREACTIVE

## 2018-06-09 LAB — SAMPLE TO BLOOD BANK

## 2018-06-09 LAB — STREP GP B NAA: Strep Gp B NAA: NEGATIVE

## 2018-06-09 MED ORDER — TERBUTALINE SULFATE 1 MG/ML IJ SOLN: 0.2500 mg | Freq: Once | INTRAMUSCULAR | Status: DC | PRN

## 2018-06-09 MED ORDER — LACTATED RINGERS IV SOLN
INTRAVENOUS | Status: DC
  Administered 2018-06-09 – 2018-06-11 (×5): via INTRAVENOUS

## 2018-06-09 MED ORDER — ONDANSETRON HCL 4 MG/2ML IJ SOLN
4.0000 mg | Freq: Four times a day (QID) | INTRAMUSCULAR | Status: DC | PRN
  Administered 2018-06-10 – 2018-06-11 (×3): 4 mg via INTRAVENOUS
  Filled 2018-06-09 (×3): qty 2

## 2018-06-09 MED ORDER — LIDOCAINE HCL (PF) 1 % IJ SOLN
30.0000 mL | INTRAMUSCULAR | Status: AC | PRN
  Administered 2018-06-11: 1.2 mL via SUBCUTANEOUS
  Filled 2018-06-09: qty 30

## 2018-06-09 MED ORDER — ONDANSETRON HCL 4 MG/2ML IJ SOLN: 4.0000 mg | Freq: Four times a day (QID) | INTRAMUSCULAR | Status: DC | PRN

## 2018-06-09 MED ORDER — LACTATED RINGERS IV SOLN
500.0000 mL | INTRAVENOUS | Status: DC | PRN
  Administered 2018-06-11: 18:00:00 500 mL via INTRAVENOUS

## 2018-06-09 MED ORDER — SOD CITRATE-CITRIC ACID 500-334 MG/5ML PO SOLN: 30.0000 mL | ORAL | Status: DC | PRN

## 2018-06-09 MED ORDER — ACETAMINOPHEN 325 MG PO TABS
ORAL_TABLET | ORAL | Status: AC
  Administered 2018-06-09: 650 mg via ORAL
  Filled 2018-06-09: qty 2

## 2018-06-09 MED ORDER — BETAMETHASONE SOD PHOS & ACET 6 (3-3) MG/ML IJ SUSP
12.0000 mg | Freq: Once | INTRAMUSCULAR | Status: AC
  Administered 2018-06-09: 12 mg via INTRAMUSCULAR

## 2018-06-09 MED ORDER — OXYTOCIN BOLUS FROM INFUSION: 500.0000 mL | Freq: Once | INTRAVENOUS | Status: DC

## 2018-06-09 MED ORDER — BETAMETHASONE SOD PHOS & ACET 6 (3-3) MG/ML IJ SUSP
INTRAMUSCULAR | Status: AC
  Filled 2018-06-09: qty 5

## 2018-06-09 MED ORDER — OXYTOCIN 40 UNITS IN NORMAL SALINE INFUSION - SIMPLE MED
1.0000 m[IU]/min | INTRAVENOUS | Status: DC
  Administered 2018-06-11 – 2018-06-12 (×3): 2 m[IU]/min via INTRAVENOUS
  Filled 2018-06-09: qty 1000

## 2018-06-09 MED ORDER — BUTORPHANOL TARTRATE 2 MG/ML IJ SOLN
1.0000 mg | INTRAMUSCULAR | Status: DC | PRN
  Administered 2018-06-09 – 2018-06-10 (×4): 1 mg via INTRAVENOUS
  Filled 2018-06-09 (×4): qty 1

## 2018-06-09 MED ORDER — LACTATED RINGERS IV SOLN
INTRAVENOUS | Status: DC
  Administered 2018-06-09: 13:00:00 via INTRAVENOUS

## 2018-06-09 MED ORDER — OXYTOCIN 40 UNITS IN NORMAL SALINE INFUSION - SIMPLE MED
2.5000 [IU]/h | INTRAVENOUS | Status: DC
  Administered 2018-06-12: 2.5 [IU]/h via INTRAVENOUS
  Filled 2018-06-09: qty 1000

## 2018-06-09 MED ORDER — ACETAMINOPHEN 325 MG PO TABS
650.0000 mg | ORAL_TABLET | ORAL | Status: DC | PRN
  Administered 2018-06-09: 650 mg via ORAL

## 2018-06-09 NOTE — H&P (Signed)
H&P  Tamara Flynn is an 22 y.o. female.  HPI: Patient presented this afternoon to triage for contractions. She took a nap but woke up and the contractions were worse. She denies LOF. She denies vaginal bleeding. She is feeling good fetal movement.   She denies headache. She denies vision changes. She denies right upper quadrant pain.   Her pregnancy has been complicated by polyhrdramnios and gestational hypertension.     FIRST Problems (from 11/18/17 to present)    Problem Noted Resolved   Supervision of high risk pregnancy, antepartum 11/18/2017 by Rexene Agent, CNM No   Overview Addendum 06/09/2018  2:59 AM by Malachy Mood, MD    [facility] Prenatal Labs  Dating  LMP=9wk [place] Blood type: B/Positive/-- (09/13 1055)   Genetic Screen Declined Antibody:Negative (09/13 1055)  Anatomic [place] Complete Rubella: <0.90 (09/13 1055) Varicella: Non-immune  GTT 115 RPR: Non Reactive (09/13 1055)   Rhogam  not needed HBsAg: Negative (09/13 1055)   TDaP vaccine 04/28/2018  Flu Shot: Declines HIV: Non Reactive (09/13 1055)   Baby Food                                [id number]  Contraception  Pap: 10/2017, NILM per patient report  CBB     CS/VBAC N/A   Support Person              Abdominal pain during pregnancy in second trimester 03/13/2018 by Rod Can, [place] 06/07/2018 by Rexene Agent, CNM       Past Medical History:  Diagnosis Date  . Abdominal pain during pregnancy in second trimester 03/13/2018  . Asthma     Past Surgical History:  Procedure Laterality Date  . WISDOM TOOTH EXTRACTION      Family History  Problem Relation Age of Onset  . Epilepsy Mother   . Epilepsy Father     Social History:  reports that she has never smoked. She has never used smokeless tobacco. She reports that she does not drink alcohol or use drugs.  Allergies:  Allergies  Allergen Reactions  . Ciprofloxacin Hcl Hives    Medications: I have reviewed the patient's current  medications.  Results for orders placed or performed during the hospital encounter of 06/09/18 (from the past 48 hour(s))  Sample to Blood Bank     Status: None   Collection Time: 06/09/18  1:19 PM  Result Value Ref Range   Blood Bank Specimen SAMPLE AVAILABLE FOR TESTING    Sample Expiration      06/12/2018 Performed at [facility], [address], [place], [place] [address]     No results found.  Review of Systems  Constitutional: Negative for chills, fever, malaise/fatigue and weight loss.  HENT: Negative for congestion, hearing loss and sinus pain.   Eyes: Negative for blurred vision and double vision.  Respiratory: Negative for cough, sputum production, shortness of breath and wheezing.   Cardiovascular: Negative for chest pain, palpitations, orthopnea and leg swelling.  Gastrointestinal: Negative for abdominal pain, constipation, diarrhea, nausea and vomiting.  Genitourinary: Negative for dysuria, flank pain, frequency, hematuria and urgency.  Musculoskeletal: Negative for back pain, falls and joint pain.  Skin: Negative for itching and rash.  Neurological: Negative for dizziness and headaches.  Psychiatric/Behavioral: Negative for depression, substance abuse and suicidal ideas. The patient is not nervous/anxious.    Height 4\' 11"  (1.499 m), weight 76.7 kg, last menstrual  period 09/24/2017. Physical Exam  Nursing note and vitals reviewed. Constitutional: She is oriented to person, place, and time. She appears well-developed and well-nourished.  HENT:  Head: Normocephalic and atraumatic.  Cardiovascular: Normal rate and regular rhythm.  Respiratory: Effort normal and breath sounds normal.  GI: Soft. Bowel sounds are normal.  Musculoskeletal: Normal range of motion.  Neurological: She is alert and oriented to person, place, and time.  Skin: Skin is warm and dry.  Psychiatric: She has a normal mood and affect. Her behavior is normal. Judgment and thought content  normal.    Assessment/Plan: 22 G1P0000 [redacted]w[redacted]d  1. Preterm labor Will admit patient for expectant management of labor 2. GBS negative 3. Epidural when desired 4. Will give betamethasone 4. GHTN- rule out pre-e  Labs were negative this morning. Monitor BP closely.    [name] [name] 06/09/2018, 4:28 PM

## 2018-06-09 NOTE — Discharge Summary (Signed)
Physician Final Progress Note  Patient ID: Tamara Flynn MRN: [id number] DOB/AGE: 05-17-1995 23 y.o.  Admit date: 06/09/2018 Admitting provider: Malachy Mood, MD Discharge date: 06/09/2018   Admission Diagnoses: Braxton Hicks contractions  Discharge Diagnoses:  Active Problems:   Braxton Hick's contraction  23 y.o. G1P0000 at [redacted]w[redacted]d presenting with contractions. She called yesterday morning stating she had terrible contractions and wasn't able to walk.  She was contacted and did not answer after having been told to present 4-hr after initial call..  On presentation she is not contracting regularly and her cervix remains closed.  +FM, no LOF, no VB.  She does have a known history of recent GHTN, labs normal 4/1.  BP on presentation mild range.  No headaches or vision changes.  She was set up for IOL on 06/10/2018 at [facility] for GHTN.  UDS and repeat P/C ratio obtained.  Temp:  [98.2 F (36.8 C)] 98.2 F (36.8 C) (04/03 0215) Pulse Rate:  [101-120] 101 (04/03 0244) Resp:  [16] 16 (04/03 0215) BP: (138-156)/(79-94) 138/79 (04/03 0244)     FIRST Problems (from 11/18/17 to present)    Problem Noted Resolved   Supervision of high risk pregnancy, antepartum 11/18/2017 by Rexene Agent, CNM No   Overview Addendum 11/28/2017 11:01 AM by Homero Fellers, MD    Clinic [facility] Prenatal Labs  Dating  LMP=9wk [place] Blood type: B/Positive/-- (09/13 1055)   Genetic Screen Declined Antibody:Negative (09/13 1055)  Anatomic [place] Normal Rubella: <0.90 (09/13 1055) Varicella: Non-immune  GTT 115 RPR: Non Reactive (09/13 1055)   Rhogam  not needed HBsAg: Negative (09/13 1055)   TDaP vaccine 04/28/2018  Flu Shot: Declines HIV: Non Reactive (09/13 1055)   Baby Food                                GBS: negative  Contraception  Pap: 10/2017, NILM per patient report  CBB     CS/VBAC N/A   Support Person              Abdominal pain during pregnancy in second trimester 03/13/2018 by Rod Can, [place]  06/07/2018 by Rexene Agent, CNM     Immunization History  Administered Date(s) Administered  . Tdap 04/28/2018     Consults: None  Significant Findings/ Diagnostic Studies: UDS and P/C ratio pending  Procedures:  Baseline: 150 Variability: moderate Accelerations: present Decelerations: absent Tocometry: none The patient was monitored for 30 minutes, fetal heart rate tracing was deemed reactive, category I tracing,  CPT [id number]   Discharge Condition: good  Disposition: Discharge disposition: 01-Home or Self Care       Diet: Regular diet  Discharge Activity: Activity as tolerated  Discharge Instructions    Discharge activity:  No Restrictions   Complete by:  As directed    Discharge diet:  No restrictions   Complete by:  As directed    Fetal Kick Count:  Lie on our left side for one hour after a meal, and count the number of times your baby kicks.  If it is less than 5 times, get up, move around and drink some juice.  Repeat the test 30 minutes later.  If it is still less than 5 kicks in an hour, notify your doctor.   Complete by:  As directed    LABOR:  When conractions begin, you should start to time them from the beginning of one contraction to the  beginning  of the next.  When contractions are 5 - 10 minutes apart or less and have been regular for at least an hour, you should call your health care provider.   Complete by:  As directed    No sexual activity restrictions   Complete by:  As directed    Notify physician for bleeding from the vagina   Complete by:  As directed    Notify physician for blurring of vision or spots before the eyes   Complete by:  As directed    Notify physician for chills or fever   Complete by:  As directed    Notify physician for fainting spells, "black outs" or loss of consciousness   Complete by:  As directed    Notify physician for increase in vaginal discharge   Complete by:  As directed    Notify physician for leaking of  fluid   Complete by:  As directed    Notify physician for pain or burning when urinating   Complete by:  As directed    Notify physician for pelvic pressure (sudden increase)   Complete by:  As directed    Notify physician for severe or continued nausea or vomiting   Complete by:  As directed    Notify physician for sudden gushing of fluid from the vagina (with or without continued leaking)   Complete by:  As directed    Notify physician for sudden, constant, or occasional abdominal pain   Complete by:  As directed    Notify physician if baby moving less than usual   Complete by:  As directed      Allergies as of 06/09/2018      Reactions   Ciprocin-fluocin-procin [fluocinolone]       Medication List    TAKE these medications   famotidine 20 MG tablet Commonly known as:  PEPCID Take 2 tablets (40 mg total) by mouth 2 (two) times daily as needed for heartburn or indigestion.   Flovent HFA 220 MCG/ACT inhaler Generic drug:  fluticasone INHALE 1 PUFF WITH SPACER TWICE A DAY FOR ASTHMA   prenatal multivitamin Tabs tablet Take 1 tablet by mouth daily at 12 noon. GUMMY   ProAir HFA 108 (90 Base) MCG/ACT inhaler Generic drug:  albuterol INHALE 2 PUFFS BY MOUTH EVERY 4 TO 6 HOURS AS NEEDED FOR WHEEZING, COUGH OR SHORTNESS OF BREATH        Total time spent taking care of this patient: patient triaged remotely over phone  Signed: Malachy Mood 06/09/2018, 2:51 AM

## 2018-06-09 NOTE — OB Triage Note (Signed)
Recvd pt from ED. PT c/o contractions that started Thursday morning. Feeling baby move well. No LOF or vaginal bleeding. Rates pain a 10 out of 10.

## 2018-06-09 NOTE — Discharge Summary (Signed)
Physician Final Progress Note  Patient ID: Tamara Flynn MRN: [id number] DOB/AGE: January 24, 1996 23 y.o.  Admit date: 06/09/2018 Admitting provider: Malachy Mood, MD Discharge date: 06/11/2018   Admission Diagnoses: [place] Hick's  Discharge Diagnoses:  Active Problems:   Braxton Hick's contraction  23 year old G1P0000 at [redacted]w[redacted]d presenting with contractions.  Was seen for similar symptoms within past 24-hrs with cervix remaining closed.  No LOF, no VB, no ctx.  Mild range BP in setting of recently diagnosed GHTN.  Patient set up for IOL at 37 weeks of 06/10/2018.  FIRST Problems (from 11/18/17 to present)    Problem Noted Resolved   Supervision of high risk pregnancy, antepartum 11/18/2017 by Rexene Agent, CNM No   Overview Addendum 06/09/2018  2:59 AM by Malachy Mood, [place]  Dating  LMP=9wk [place] Blood type: B/Positive/-- (09/13 1055)   Genetic Screen Declined Antibody:Negative (09/13 1055)  Anatomic [place] Complete Rubella: <0.90 (09/13 1055) Varicella: Non-immune  GTT 115 RPR: Non Reactive (09/13 1055)   Rhogam  not needed HBsAg: Negative (09/13 1055)   TDaP vaccine 04/28/2018  Flu Shot: Declines HIV: Non Reactive (09/13 1055)   Baby Food                                [id number]  Contraception  Pap: 10/2017, NILM per patient report  CBB     CS/VBAC N/A   Support Person              Abdominal pain during pregnancy in second trimester 03/13/2018 by Rod Can, CNM 06/07/2018 by Rexene Agent, CNM      Consults: None  Significant Findings/ Diagnostic Studies: none  Procedures:  Baseline: 150 Variability: moderate Accelerations: present Decelerations: absent Tocometry: none The patient was monitored for 30 minutes, fetal heart rate tracing was deemed reactive, category I tracing,  Discharge Condition: good  Disposition: Discharge disposition: 01-Home or Self Care       Diet: Regular diet  Discharge Activity: Activity as  tolerated  Discharge Instructions    Discharge activity:  No Restrictions   Complete by:  As directed    Discharge diet:  No restrictions   Complete by:  As directed    Fetal Kick Count:  Lie on our left side for one hour after a meal, and count the number of times your baby kicks.  If it is less than 5 times, get up, move around and drink some juice.  Repeat the test 30 minutes later.  If it is still less than 5 kicks in an hour, notify your doctor.   Complete by:  As directed    LABOR:  When conractions begin, you should start to time them from the beginning of one contraction to the beginning  of the next.  When contractions are 5 - 10 minutes apart or less and have been regular for at least an hour, you should call your health care provider.   Complete by:  As directed    No sexual activity restrictions   Complete by:  As directed    Notify physician for bleeding from the vagina   Complete by:  As directed    Notify physician for blurring of vision or spots before the eyes   Complete by:  As directed    Notify physician for chills or fever   Complete by:  As directed    Notify  physician for fainting spells, "black outs" or loss of consciousness   Complete by:  As directed    Notify physician for increase in vaginal discharge   Complete by:  As directed    Notify physician for leaking of fluid   Complete by:  As directed    Notify physician for pain or burning when urinating   Complete by:  As directed    Notify physician for pelvic pressure (sudden increase)   Complete by:  As directed    Notify physician for severe or continued nausea or vomiting   Complete by:  As directed    Notify physician for sudden gushing of fluid from the vagina (with or without continued leaking)   Complete by:  As directed    Notify physician for sudden, constant, or occasional abdominal pain   Complete by:  As directed    Notify physician if baby moving less than usual   Complete by:  As directed       Allergies as of 06/09/2018      Reactions   Ciprofloxacin Hcl Hives      Medication List    TAKE these medications   famotidine 20 MG tablet Commonly known as:  PEPCID Take 2 tablets (40 mg total) by mouth 2 (two) times daily as needed for heartburn or indigestion.   Flovent HFA 220 MCG/ACT inhaler Generic drug:  fluticasone INHALE 1 PUFF WITH SPACER TWICE A DAY FOR ASTHMA   prenatal multivitamin Tabs tablet Take 1 tablet by mouth daily at 12 noon. GUMMY   ProAir HFA 108 (90 Base) MCG/ACT inhaler Generic drug:  albuterol INHALE 2 PUFFS BY MOUTH EVERY 4 TO 6 HOURS AS NEEDED FOR WHEEZING, COUGH OR SHORTNESS OF BREATH      Follow-up Information    [facility] Follow up.   Why:  Go to scheduled appointment today. Induction Date: 06/10/2018 at 8:00p.m. Contact information: [address] [place] [address] [phone]          Total time spent taking care of this patient: phone triage  Signed: Malachy Mood

## 2018-06-10 DIAGNOSIS — O403XX0 Polyhydramnios, third trimester, not applicable or unspecified: Secondary | ICD-10-CM

## 2018-06-10 DIAGNOSIS — O134 Gestational [pregnancy-induced] hypertension without significant proteinuria, complicating childbirth: Secondary | ICD-10-CM

## 2018-06-10 DIAGNOSIS — Z3A37 37 weeks gestation of pregnancy: Secondary | ICD-10-CM

## 2018-06-10 MED ORDER — MISOPROSTOL 200 MCG PO TABS
ORAL_TABLET | ORAL | Status: AC
  Filled 2018-06-10: qty 5

## 2018-06-10 MED ORDER — SODIUM CHLORIDE (PF) 0.9 % IJ SOLN
INTRAMUSCULAR | Status: AC
  Filled 2018-06-10: qty 50

## 2018-06-10 MED ORDER — MISOPROSTOL 100 MCG PO TABS
25.0000 ug | ORAL_TABLET | ORAL | Status: DC
  Administered 2018-06-10 – 2018-06-11 (×3): 25 ug via VAGINAL
  Filled 2018-06-10 (×4): qty 1

## 2018-06-10 NOTE — Progress Notes (Signed)
  Labor Progress Note   23 y.o. G1P0000 @ 102w0d , admitted for  Pregnancy, Labor Management.   Subjective:  Sitting up in bed with clear liquid tray. Has lower back pain.  Objective:  BP (!) 118/58 (BP Location: Right Arm)   Pulse (!) 110   Temp 98.1 F (36.7 C) (Oral)   Resp 18   Ht 4\' 11"  (1.499 m)   Wt 76.7 kg   LMP 09/24/2017 (LMP Unknown)   BMI 34.13 kg/m  Abd: gravid, non-tender, painful contractions Extr: 1+ bilateral pedal edema SVE: 1/40/posterior per RN exam  EFM: FHR: 130 bpm, variability: moderate,  accelerations:  Present,  decelerations:  Absent Toco: Frequency: Every 2-3 minutes, Duration: 40-60 seconds and Intensity: mild Labs: I have reviewed the patient's lab results.   Assessment & Plan:  G1P0000 @ [redacted]w[redacted]d, admitted for  Pregnancy and Labor/Delivery Management  1. Pain management: IV sedation. 2. FWB: FHT category I.  3. ID: GBS negative 4. Labor management: Cervical exam at 1030 to determine whether another Cytotec is needed. Heating pad for back pain and may shower after breakfast.  All discussed with patient, see orders  Avel Sensor, CNM 06/10/2018

## 2018-06-10 NOTE — Progress Notes (Signed)
  Labor Progress Note   23 y.o. G1P0000 @ [redacted]w[redacted]d , admitted for  Pregnancy, Labor Management.   Subjective:  Side-lying, contractions still painful but have spaced out some.  Objective:  BP (!) 143/85 (BP Location: Right Arm)   Pulse (!) 112   Temp 98.7 F (37.1 C) (Oral)   Resp 18   Ht 4\' 11"  (1.499 m)   Wt 76.7 kg   LMP 09/24/2017 (LMP Unknown)   BMI 34.13 kg/m  Abd: gravid, mild contractions Extr: trace to 1+ bilateral pedal edema SVE: 1/50/-3  EFM: FHR: 130 bpm, variability: moderate,  accelerations:  Present,  decelerations:  Absent Toco: Frequency: Every 2.5-3.5 minutes, Duration: 40-80 seconds and Intensity: mild Labs: I have reviewed the patient's lab results.   Assessment & Plan:  G1P0000 @ [redacted]w[redacted]d, admitted for  Pregnancy and Labor/Delivery Management  1. Pain management: IV sedation. 2. FWB: FHT category I.  3. ID: GBS negative 4. Labor management: Foley bulb inserted now.  All discussed with patient.  Avel Sensor, CNM 06/10/2018

## 2018-06-11 ENCOUNTER — Inpatient Hospital Stay: Payer: Medicaid Other | Admitting: Anesthesiology

## 2018-06-11 LAB — CBC
HCT: 35.8 % — ABNORMAL LOW (ref 36.0–46.0)
Hemoglobin: 11.9 g/dL — ABNORMAL LOW (ref 12.0–15.0)
MCH: 28.3 pg (ref 26.0–34.0)
MCHC: 33.2 g/dL (ref 30.0–36.0)
MCV: 85.2 fL (ref 80.0–100.0)
Platelets: 185 10*3/uL (ref 150–400)
RBC: 4.2 MIL/uL (ref 3.87–5.11)
RDW: 13.9 % (ref 11.5–15.5)
WBC: 13.2 10*3/uL — ABNORMAL HIGH (ref 4.0–10.5)
nRBC: 0 % (ref 0.0–0.2)

## 2018-06-11 LAB — RPR: RPR Ser Ql: NONREACTIVE

## 2018-06-11 MED ORDER — DIPHENHYDRAMINE HCL 50 MG/ML IJ SOLN: 12.5000 mg | INTRAMUSCULAR | Status: DC | PRN

## 2018-06-11 MED ORDER — EPHEDRINE 5 MG/ML INJ: 10.0000 mg | INTRAVENOUS | Status: DC | PRN

## 2018-06-11 MED ORDER — PHENYLEPHRINE 40 MCG/ML (10ML) SYRINGE FOR IV PUSH (FOR BLOOD PRESSURE SUPPORT): 80.0000 ug | PREFILLED_SYRINGE | INTRAVENOUS | Status: DC | PRN

## 2018-06-11 MED ORDER — FENTANYL CITRATE (PF) 100 MCG/2ML IJ SOLN
50.0000 ug | INTRAMUSCULAR | Status: DC | PRN
  Administered 2018-06-11 (×2): 100 ug via INTRAVENOUS
  Filled 2018-06-11 (×2): qty 2

## 2018-06-11 MED ORDER — MISOPROSTOL 50MCG HALF TABLET
50.0000 ug | ORAL_TABLET | Freq: Once | ORAL | Status: AC
  Administered 2018-06-11: 20:00:00 50 ug via ORAL

## 2018-06-11 MED ORDER — ACETAMINOPHEN 500 MG PO TABS
1000.0000 mg | ORAL_TABLET | Freq: Four times a day (QID) | ORAL | Status: DC | PRN
  Administered 2018-06-11: 1000 mg via ORAL
  Filled 2018-06-11: qty 2

## 2018-06-11 MED ORDER — LACTATED RINGERS IV SOLN: 500.0000 mL | Freq: Once | INTRAVENOUS | Status: DC

## 2018-06-11 MED ORDER — MISOPROSTOL 50MCG HALF TABLET
ORAL_TABLET | ORAL | Status: AC
  Filled 2018-06-11: qty 1

## 2018-06-11 MED ORDER — LIDOCAINE-EPINEPHRINE (PF) 1.5 %-1:200000 IJ SOLN
INTRAMUSCULAR | Status: DC | PRN
  Administered 2018-06-11: 3 mL via EPIDURAL

## 2018-06-11 MED ORDER — FENTANYL 2.5 MCG/ML W/ROPIVACAINE 0.15% IN NS 100 ML EPIDURAL (ARMC)
EPIDURAL | Status: AC
  Filled 2018-06-11: qty 100

## 2018-06-11 MED ORDER — FENTANYL 2.5 MCG/ML W/ROPIVACAINE 0.15% IN NS 100 ML EPIDURAL (ARMC)
EPIDURAL | Status: DC | PRN
  Administered 2018-06-11: 12 mL/h via EPIDURAL

## 2018-06-11 MED ORDER — SODIUM CHLORIDE (PF) 0.9 % IJ SOLN
INTRAMUSCULAR | Status: AC
  Filled 2018-06-11: qty 50

## 2018-06-11 MED ORDER — FENTANYL 2.5 MCG/ML W/ROPIVACAINE 0.15% IN NS 100 ML EPIDURAL (ARMC)
12.0000 mL/h | EPIDURAL | Status: DC
  Administered 2018-06-11 – 2018-06-12 (×2): 12 mL/h via EPIDURAL
  Filled 2018-06-11 (×2): qty 100

## 2018-06-11 NOTE — Progress Notes (Signed)
  Labor Progress Note   23 y.o. G1P0000 @ [redacted]w[redacted]d , admitted for  Pregnancy, Labor Management.   Subjective:  With RN supporting patient into different positions to encourage baby's descent. Painful conttactions. Does not desire epidural at this time.  Objective:  BP 128/67   Pulse (!) 101   Temp 98.6 F (37 C) (Oral)   Resp 18   Ht 4\' 11"  (1.499 m)   Wt 76.7 kg   LMP 09/24/2017 (LMP Unknown)   BMI 34.13 kg/m  Abd: gravid, mild contractions Extr: trace to 1+ bilateral pedal edema SVE: Last exam 1.5/40/-3 (Dr. Georgianne Fick)  EFM/Toco: Currently off monitor, EFM has been reassuring, occasional painful contractions Labs: I have reviewed the patient's lab results.   Assessment & Plan:  G1P0000 @ [redacted]w[redacted]d, admitted for  Pregnancy and Labor/Delivery Management  1. Pain management: IV sedation. 2. FWB: REassuring 3. ID: GBS negative 4. Labor management: begin Pitocin soon, continue position changes as tolerated.  All discussed with patient.  Avel Sensor, CNM 06/11/2018

## 2018-06-11 NOTE — Progress Notes (Signed)
  Labor Progress Note   23 y.o. G1P0000 @ [redacted]w[redacted]d , admitted for  Pregnancy, Labor Management.   Subjective:  Comfortable with epidural.  Objective:  BP 117/66   Pulse (!) 106   Temp 98.4 F (36.9 C) (Oral)   Resp 20   Ht 4\' 11"  (1.499 m)   Wt 76.7 kg   LMP 09/24/2017 (LMP Unknown)   SpO2 99%   BMI 34.13 kg/m  Abd: gravid, non-tender Extr: trace to 1+ bilateral pedal edema SVE: 1.5/40/-3, Foley bulb catheter placed  EFM: FHR: 130 bpm, variability: moderate,  accelerations:  Present,  decelerations:  Absent Toco: Frequency: Every 3.5-7 minutes, Duration: 50-80 seconds and Intensity: mild Labs: I have reviewed the patient's lab results.   Assessment & Plan:  G1P0000 @ [redacted]w[redacted]d, admitted for  Pregnancy and Labor/Delivery Management  1. Pain management: epidural. 2. FWB: FHT category I.  3. ID: GBS negative 4. Labor management: Will also give 50 mcg Cytotec per consultation with Dr. Georgianne Fick.  All discussed with patient.  Avel Sensor, CNM 06/11/2018

## 2018-06-11 NOTE — Anesthesia Procedure Notes (Signed)
Epidural Patient location during procedure: OB Start time: 06/11/2018 5:50 PM End time: 06/11/2018 6:13 PM  Staffing Performed: anesthesiologist   Preanesthetic Checklist Completed: patient identified, site marked, surgical consent, pre-op evaluation, timeout performed, IV checked, risks and benefits discussed and monitors and equipment checked  Epidural Patient position: sitting Prep: Betadine Patient monitoring: heart rate, continuous pulse ox and blood pressure Approach: midline Location: L4-L5 Injection technique: LOR saline  Needle:  Needle type: Tuohy  Needle gauge: 17 G Needle length: 9 cm and 9 Needle insertion depth: 8 cm Catheter type: closed end flexible Catheter size: 20 Guage Catheter at skin depth: 13 cm Test dose: negative and 1.5% lidocaine with Epi 1:200 K  Assessment Events: blood not aspirated, injection not painful, no injection resistance, negative IV test and no paresthesia  Additional Notes   Patient tolerated the insertion well without complications.Reason for block:procedure for pain

## 2018-06-11 NOTE — Anesthesia Preprocedure Evaluation (Signed)
Anesthesia Evaluation  Patient identified by MRN, date of birth, ID band Patient awake    Reviewed: Allergy & Precautions, NPO status , Patient's Chart, lab work & pertinent test results  History of Anesthesia Complications Negative for: history of anesthetic complications  Airway Mallampati: II       Dental   Pulmonary asthma , neg sleep apnea, neg COPD,           Cardiovascular hypertension (with pregnancy), Pt. on medications (-) Past MI and (-) CHF (-) dysrhythmias (-) Valvular Problems/Murmurs     Neuro/Psych neg Seizures    GI/Hepatic Neg liver ROS, GERD (with pregnancy)  ,  Endo/Other  neg diabetes  Renal/GU negative Renal ROS     Musculoskeletal   Abdominal   Peds  Hematology   Anesthesia Other Findings   Reproductive/Obstetrics                             Anesthesia Physical Anesthesia Plan  ASA: II  Anesthesia Plan: Epidural   Post-op Pain Management:    Induction:   PONV Risk Score and Plan:   Airway Management Planned:   Additional Equipment:   Intra-op Plan:   Post-operative Plan:   Informed Consent: I have reviewed the patients History and Physical, chart, labs and discussed the procedure including the risks, benefits and alternatives for the proposed anesthesia with the patient or authorized representative who has indicated his/her understanding and acceptance.       Plan Discussed with:   Anesthesia Plan Comments:         Anesthesia Quick Evaluation

## 2018-06-12 ENCOUNTER — Encounter
Admission: EM | Disposition: A | Discharge status: Home / Self Care | Payer: Self-pay | Source: Home / Self Care | Attending: Maternal Newborn

## 2018-06-12 SURGERY — Surgical Case
Anesthesia: Epidural

## 2018-06-12 MED ORDER — EPINEPHRINE PF 1 MG/ML IJ SOLN
INTRAMUSCULAR | Status: AC
  Filled 2018-06-12: qty 1

## 2018-06-12 MED ORDER — SIMETHICONE 80 MG PO CHEW
80.0000 mg | CHEWABLE_TABLET | Freq: Three times a day (TID) | ORAL | Status: DC
  Administered 2018-06-13 (×3): 80 mg via ORAL
  Filled 2018-06-12 (×3): qty 1

## 2018-06-12 MED ORDER — SENNOSIDES-DOCUSATE SODIUM 8.6-50 MG PO TABS
2.0000 | ORAL_TABLET | ORAL | Status: DC
  Administered 2018-06-12: 2 via ORAL
  Filled 2018-06-12: qty 2

## 2018-06-12 MED ORDER — OXYCODONE HCL 5 MG PO TABS: 10.0000 mg | ORAL_TABLET | ORAL | Status: DC | PRN

## 2018-06-12 MED ORDER — ONDANSETRON HCL 4 MG/2ML IJ SOLN
4.0000 mg | Freq: Three times a day (TID) | INTRAMUSCULAR | Status: DC | PRN
  Administered 2018-06-12: 4 mg via INTRAVENOUS
  Filled 2018-06-12: qty 2

## 2018-06-12 MED ORDER — VARICELLA VIRUS VACCINE LIVE 1350 PFU/0.5ML IJ SUSR: 0.5000 mL | INTRAMUSCULAR | Status: DC | PRN

## 2018-06-12 MED ORDER — ONDANSETRON HCL 4 MG/2ML IJ SOLN
INTRAMUSCULAR | Status: DC | PRN
  Administered 2018-06-12: 4 mg via INTRAVENOUS

## 2018-06-12 MED ORDER — OXYTOCIN 40 UNITS IN NORMAL SALINE INFUSION - SIMPLE MED
INTRAVENOUS | Status: DC | PRN
  Administered 2018-06-12: 10 mL via INTRAVENOUS
  Administered 2018-06-12: 300 mL via INTRAVENOUS

## 2018-06-12 MED ORDER — MORPHINE SULFATE (PF) 0.5 MG/ML IJ SOLN
INTRAMUSCULAR | Status: AC
  Filled 2018-06-12: qty 10

## 2018-06-12 MED ORDER — ALBUTEROL SULFATE (2.5 MG/3ML) 0.083% IN NEBU: 2.5000 mg | INHALATION_SOLUTION | RESPIRATORY_TRACT | Status: DC | PRN

## 2018-06-12 MED ORDER — MORPHINE SULFATE-NACL 0.5-0.9 MG/ML-% IV SOSY
PREFILLED_SYRINGE | INTRAVENOUS | Status: DC | PRN
  Administered 2018-06-12: 3 mg via EPIDURAL

## 2018-06-12 MED ORDER — FENTANYL CITRATE (PF) 100 MCG/2ML IJ SOLN
INTRAMUSCULAR | Status: DC | PRN
  Administered 2018-06-12 (×2): 50 ug via INTRAVENOUS
  Administered 2018-06-12: 100 ug via EPIDURAL

## 2018-06-12 MED ORDER — CLINDAMYCIN PHOSPHATE 900 MG/50ML IV SOLN
900.0000 mg | Freq: Three times a day (TID) | INTRAVENOUS | Status: DC
  Administered 2018-06-12 – 2018-06-14 (×5): 900 mg via INTRAVENOUS
  Filled 2018-06-12 (×9): qty 50

## 2018-06-12 MED ORDER — LIDOCAINE HCL (PF) 2 % IJ SOLN
INTRAMUSCULAR | Status: DC | PRN
  Administered 2018-06-12: 200 mg via EPIDURAL

## 2018-06-12 MED ORDER — LIDOCAINE HCL (PF) 2 % IJ SOLN
INTRAMUSCULAR | Status: AC
  Filled 2018-06-12: qty 10

## 2018-06-12 MED ORDER — BUPIVACAINE HCL 0.5 % IJ SOLN
INTRAMUSCULAR | Status: DC | PRN
  Administered 2018-06-12: 10 mL

## 2018-06-12 MED ORDER — CEFAZOLIN SODIUM-DEXTROSE 2-4 GM/100ML-% IV SOLN
2.0000 g | Freq: Once | INTRAVENOUS | Status: AC
  Administered 2018-06-12: 13:00:00 2 g via INTRAVENOUS
  Filled 2018-06-12: qty 100

## 2018-06-12 MED ORDER — NALBUPHINE HCL 10 MG/ML IJ SOLN: 5.0000 mg | Freq: Once | INTRAMUSCULAR | Status: DC | PRN

## 2018-06-12 MED ORDER — SOD CITRATE-CITRIC ACID 500-334 MG/5ML PO SOLN
30.0000 mL | ORAL | Status: AC
  Administered 2018-06-12: 13:00:00 30 mL via ORAL

## 2018-06-12 MED ORDER — SODIUM CHLORIDE 0.9% FLUSH: 3.0000 mL | INTRAVENOUS | Status: DC | PRN

## 2018-06-12 MED ORDER — ACETAMINOPHEN 325 MG PO TABS
650.0000 mg | ORAL_TABLET | Freq: Four times a day (QID) | ORAL | Status: AC
  Administered 2018-06-12 – 2018-06-13 (×4): 650 mg via ORAL
  Filled 2018-06-12 (×4): qty 2

## 2018-06-12 MED ORDER — DIPHENHYDRAMINE HCL 25 MG PO CAPS: 25.0000 mg | ORAL_CAPSULE | ORAL | Status: DC | PRN

## 2018-06-12 MED ORDER — KETOROLAC TROMETHAMINE 30 MG/ML IJ SOLN: 30.0000 mg | Freq: Four times a day (QID) | INTRAMUSCULAR | Status: AC

## 2018-06-12 MED ORDER — IBUPROFEN 600 MG PO TABS
600.0000 mg | ORAL_TABLET | Freq: Four times a day (QID) | ORAL | Status: DC
  Administered 2018-06-13 – 2018-06-14 (×4): 600 mg via ORAL
  Filled 2018-06-12 (×4): qty 1

## 2018-06-12 MED ORDER — DIPHENHYDRAMINE HCL 25 MG PO CAPS: 25.0000 mg | ORAL_CAPSULE | Freq: Four times a day (QID) | ORAL | Status: DC | PRN

## 2018-06-12 MED ORDER — DIBUCAINE 1 % RE OINT: 1.0000 "application " | TOPICAL_OINTMENT | RECTAL | Status: DC | PRN

## 2018-06-12 MED ORDER — SOD CITRATE-CITRIC ACID 500-334 MG/5ML PO SOLN
ORAL | Status: AC
  Administered 2018-06-12: 13:00:00 30 mL via ORAL
  Filled 2018-06-12: qty 15

## 2018-06-12 MED ORDER — NALBUPHINE HCL 10 MG/ML IJ SOLN: 5.0000 mg | INTRAMUSCULAR | Status: DC | PRN

## 2018-06-12 MED ORDER — OXYCODONE-ACETAMINOPHEN 5-325 MG PO TABS
2.0000 | ORAL_TABLET | ORAL | Status: DC | PRN
  Administered 2018-06-13 – 2018-06-14 (×4): 2 via ORAL
  Filled 2018-06-12 (×4): qty 2

## 2018-06-12 MED ORDER — MEASLES, MUMPS & RUBELLA VAC IJ SOLR
0.5000 mL | Freq: Once | INTRAMUSCULAR | Status: DC
  Filled 2018-06-12: qty 0.5

## 2018-06-12 MED ORDER — PHENYLEPHRINE 40 MCG/ML (10ML) SYRINGE FOR IV PUSH (FOR BLOOD PRESSURE SUPPORT)
PREFILLED_SYRINGE | INTRAVENOUS | Status: DC | PRN
  Administered 2018-06-12: 50 ug via INTRAVENOUS
  Administered 2018-06-12: 100 ug via INTRAVENOUS

## 2018-06-12 MED ORDER — FENTANYL CITRATE (PF) 100 MCG/2ML IJ SOLN
INTRAMUSCULAR | Status: AC
  Filled 2018-06-12: qty 2

## 2018-06-12 MED ORDER — BUPIVACAINE HCL (PF) 0.5 % IJ SOLN
INTRAMUSCULAR | Status: AC
  Filled 2018-06-12: qty 30

## 2018-06-12 MED ORDER — BUPIVACAINE 0.25 % ON-Q PUMP DUAL CATH 400 ML
400.0000 mL | INJECTION | Status: DC
  Filled 2018-06-12: qty 400

## 2018-06-12 MED ORDER — LIDOCAINE HCL 2 % IJ SOLN
INTRAMUSCULAR | Status: AC
  Filled 2018-06-12: qty 10

## 2018-06-12 MED ORDER — OXYCODONE HCL 5 MG PO TABS: 5.0000 mg | ORAL_TABLET | ORAL | Status: DC | PRN

## 2018-06-12 MED ORDER — OXYCODONE-ACETAMINOPHEN 5-325 MG PO TABS: 1.0000 | ORAL_TABLET | ORAL | Status: DC | PRN

## 2018-06-12 MED ORDER — SODIUM CHLORIDE 0.9 % IV SOLN
500.0000 mg | INTRAVENOUS | Status: DC
  Administered 2018-06-12: 500 mg via INTRAVENOUS
  Filled 2018-06-12 (×2): qty 500

## 2018-06-12 MED ORDER — PRENATAL MULTIVITAMIN CH
1.0000 | ORAL_TABLET | Freq: Every day | ORAL | Status: DC
  Administered 2018-06-13 – 2018-06-14 (×2): 1 via ORAL
  Filled 2018-06-12 (×2): qty 1

## 2018-06-12 MED ORDER — GENTAMICIN SULFATE 40 MG/ML IJ SOLN
5.0000 mg/kg | INTRAVENOUS | Status: DC
  Administered 2018-06-12 – 2018-06-13 (×2): 380 mg via INTRAVENOUS
  Filled 2018-06-12 (×3): qty 9.5

## 2018-06-12 MED ORDER — SODIUM CHLORIDE 0.9 % IV SOLN
INTRAVENOUS | Status: DC | PRN
  Administered 2018-06-12: 35 ug/min via INTRAVENOUS

## 2018-06-12 MED ORDER — COCONUT OIL OIL: 1.0000 "application " | TOPICAL_OIL | Status: DC | PRN

## 2018-06-12 MED ORDER — MENTHOL 3 MG MT LOZG
1.0000 | LOZENGE | OROMUCOSAL | Status: DC | PRN
  Filled 2018-06-12: qty 9

## 2018-06-12 MED ORDER — KETOROLAC TROMETHAMINE 30 MG/ML IJ SOLN
30.0000 mg | Freq: Four times a day (QID) | INTRAMUSCULAR | Status: AC
  Administered 2018-06-12 – 2018-06-13 (×4): 30 mg via INTRAVENOUS
  Filled 2018-06-12 (×4): qty 1

## 2018-06-12 MED ORDER — FERROUS SULFATE 325 (65 FE) MG PO TABS
325.0000 mg | ORAL_TABLET | Freq: Two times a day (BID) | ORAL | Status: DC
  Administered 2018-06-13 – 2018-06-14 (×3): 325 mg via ORAL
  Filled 2018-06-12 (×3): qty 1

## 2018-06-12 MED ORDER — PROMETHAZINE HCL 25 MG/ML IJ SOLN
12.5000 mg | Freq: Four times a day (QID) | INTRAMUSCULAR | Status: DC | PRN
  Administered 2018-06-12: 12.5 mg via INTRAVENOUS
  Filled 2018-06-12: qty 1

## 2018-06-12 MED ORDER — DIPHENHYDRAMINE HCL 50 MG/ML IJ SOLN
12.5000 mg | INTRAMUSCULAR | Status: DC | PRN
  Administered 2018-06-13: 12.5 mg via INTRAVENOUS
  Filled 2018-06-12: qty 1

## 2018-06-12 MED ORDER — NALOXONE HCL 0.4 MG/ML IJ SOLN: 0.4000 mg | INTRAMUSCULAR | Status: DC | PRN

## 2018-06-12 MED ORDER — CARBOPROST TROMETHAMINE 250 MCG/ML IM SOLN
INTRAMUSCULAR | Status: AC
  Filled 2018-06-12: qty 1

## 2018-06-12 MED ORDER — WITCH HAZEL-GLYCERIN EX PADS: 1.0000 "application " | MEDICATED_PAD | CUTANEOUS | Status: DC | PRN

## 2018-06-12 MED ORDER — OXYTOCIN 40 UNITS IN NORMAL SALINE INFUSION - SIMPLE MED
2.5000 [IU]/h | INTRAVENOUS | Status: DC
  Administered 2018-06-13: 2.5 [IU]/h via INTRAVENOUS
  Filled 2018-06-12 (×2): qty 1000

## 2018-06-12 MED ORDER — METHYLERGONOVINE MALEATE 0.2 MG/ML IJ SOLN
INTRAMUSCULAR | Status: AC
  Filled 2018-06-12: qty 1

## 2018-06-12 MED ORDER — BUPIVACAINE HCL (PF) 0.5 % IJ SOLN: 10.0000 mL | Freq: Once | INTRAMUSCULAR | Status: DC

## 2018-06-12 MED ORDER — BUDESONIDE 0.5 MG/2ML IN SUSP
1.0000 mg | Freq: Two times a day (BID) | RESPIRATORY_TRACT | Status: DC
  Filled 2018-06-12 (×5): qty 4

## 2018-06-12 MED ORDER — LACTATED RINGERS IV SOLN
INTRAVENOUS | Status: DC
  Administered 2018-06-13: 06:00:00 via INTRAVENOUS

## 2018-06-12 MED ORDER — OXYTOCIN 40 UNITS IN NORMAL SALINE INFUSION - SIMPLE MED
INTRAVENOUS | Status: AC
  Filled 2018-06-12: qty 1000

## 2018-06-12 SURGICAL SUPPLY — 30 items
CANISTER SUCT 3000ML PPV (MISCELLANEOUS) ×2 IMPLANT
CATH KIT ON-Q SILVERSOAK 5IN (CATHETERS) ×4 IMPLANT
COVER WAND RF STERILE (DRAPES) ×2 IMPLANT
DERMABOND ADVANCED (GAUZE/BANDAGES/DRESSINGS) ×1
DERMABOND ADVANCED .7 DNX12 (GAUZE/BANDAGES/DRESSINGS) ×1 IMPLANT
DRSG OPSITE POSTOP 4X10 (GAUZE/BANDAGES/DRESSINGS) ×2 IMPLANT
DRSG TELFA 3X8 NADH (GAUZE/BANDAGES/DRESSINGS) ×2 IMPLANT
ELECT CAUTERY BLADE 6.4 (BLADE) ×2 IMPLANT
ELECT REM PT RETURN 9FT ADLT (ELECTROSURGICAL) ×2
ELECTRODE REM PT RTRN 9FT ADLT (ELECTROSURGICAL) ×1 IMPLANT
GAUZE SPONGE 4X4 12PLY STRL (GAUZE/BANDAGES/DRESSINGS) ×2 IMPLANT
GLOVE BIO SURGEON STRL SZ7 (GLOVE) ×2 IMPLANT
GLOVE INDICATOR 7.5 STRL GRN (GLOVE) ×2 IMPLANT
GOWN STRL REUS W/ TWL LRG LVL3 (GOWN DISPOSABLE) ×3 IMPLANT
GOWN STRL REUS W/TWL LRG LVL3 (GOWN DISPOSABLE) ×3
NS IRRIG 1000ML POUR BTL (IV SOLUTION) ×2 IMPLANT
PACK C SECTION AR (MISCELLANEOUS) ×2 IMPLANT
PAD OB MATERNITY 4.3X12.25 (PERSONAL CARE ITEMS) ×4 IMPLANT
PAD PREP 24X41 OB/GYN DISP (PERSONAL CARE ITEMS) ×2 IMPLANT
STRIP CLOSURE SKIN 1/2X4 (GAUZE/BANDAGES/DRESSINGS) ×2 IMPLANT
SUT MNCRL 4-0 (SUTURE) ×1
SUT MNCRL 4-0 27XMFL (SUTURE) ×1
SUT PDS AB 1 TP1 96 (SUTURE) ×2 IMPLANT
SUT PLAIN GUT 0 (SUTURE) IMPLANT
SUT VIC AB 0 CTX 36 (SUTURE) ×2
SUT VIC AB 0 CTX36XBRD ANBCTRL (SUTURE) ×2 IMPLANT
SUT VIC AB 3-0 SH 27 (SUTURE) ×1
SUT VIC AB 3-0 SH 27X BRD (SUTURE) ×1 IMPLANT
SUTURE MNCRL 4-0 27XMF (SUTURE) ×1 IMPLANT
SWABSTK COMLB BENZOIN TINCTURE (MISCELLANEOUS) ×2 IMPLANT

## 2018-06-12 NOTE — Progress Notes (Signed)
L&D Progress Note   S: Complaining of increased vaginal pain  O: BP 139/89 (BP Location: Right Arm)   Pulse 92   Temp 98.4 F (36.9 C) (Oral)   Resp 18   Ht 4\' 11"  (1.499 m)   Wt 76.7 kg   LMP 09/24/2017 (LMP Unknown)   SpO2 97%   BMI 34.13 kg/m    FHR: 145-150 baseline with accelerations to 160s, late decelerations x 2 IUPC: hyperstimulation on 10 miu/min Pitocin, unable to get contractions stronger, but they became more frequent with increasing Pitocin Cervix: 3.5/80%/-2  A: FITL/failed induction  P: Discontinued Pitocin-FHR decelerations resolved. Discontinued IUPC per patient request Discussed Cesarean sections risks including bleeding, infection, injury to other organs or blood vessels in pelvis, and the risks of anesthesia. Reviewed the risks of blood transfusion (had already signed blood transfusion consent).  Antibiotics for surgical prophylaxis ordered. Will consult with Dr Glennon Mac who is currently in the OR.  Dalia Heading, CNM

## 2018-06-12 NOTE — Transfer of Care (Signed)
Immediate Anesthesia Transfer of Care Note  Patient: Tamara Flynn  Procedure(s) Performed: CESAREAN SECTION (N/A )  Patient Location: PACU and Mother/Baby  Anesthesia Type:Epidural  Level of Consciousness: awake and patient cooperative  Airway & Oxygen Therapy: Patient Spontanous Breathing and Patient connected to nasal cannula oxygen  Post-op Assessment: Report given to RN and Post -op Vital signs reviewed and stable  Post vital signs: Reviewed and stable  Last Vitals:  Vitals Value Taken Time  BP 112/63 06/12/2018  2:44 PM  Temp 37.9 C 06/12/2018  2:44 PM  Pulse 124   Resp 18 06/12/2018  2:44 PM  SpO2 100%     Last Pain:  Vitals:   06/12/18 1444  TempSrc: Oral  PainSc:          Complications: No apparent anesthesia complications

## 2018-06-12 NOTE — Progress Notes (Signed)
L&D progress Note  S: Pelvic pressure/vaginal pressure with contractions, but has been able to sleep a little this AM after the Pitocin was discontinued early this AM. Pitocin was discontinued by RN for minimal variability. Pitocin was restarted at 0730  O: BP 127/84 (BP Location: Right Arm)   Pulse (!) 103   Temp 98.3 F (36.8 C)   Resp 18   Ht 4\' 11"  (1.499 m)   Wt 76.7 kg   LMP 09/24/2017 (LMP Unknown)   SpO2 99%   BMI 34.13 kg/m   Normotensive overnight  Abdomen:  FHR: 130s with accelerations to 150s, +scalp stim with exam, moderate variability with periods of minimal variability IUPC: frequent contractions, but mvus around 110 with Pitocin at 4 miu/min.   Pelvimetry:small pelvic outlet Cervix: 3.5/70%/-2  Extremities: tense non pitting edema up to patient's thighs  A/P: Day 3 of IOL for gestational hypertension  Normotensive x 12 hours No cervical change since 0230 AROM x 9 hours Pitocin has been off and on due to minimal variability. Has had a dysfunctional labor pattern and has a small outlet DIscussed monitoring for change x several more hours if tolerated by baby and if no change, recommend proceeding with Cesarean section. Patient is exhausted/ frustrated with lack of progress and agrees with plan of management.   Dalia Heading, CNM

## 2018-06-12 NOTE — Anesthesia Post-op Follow-up Note (Signed)
Anesthesia QCDR form completed.        

## 2018-06-12 NOTE — Progress Notes (Signed)
IUPC placed; patient tolerated the procedure well.   Avel Sensor, CNM 06/12/2018

## 2018-06-12 NOTE — Progress Notes (Signed)
  Labor Progress Note   23 y.o. G1P0000 @ [redacted]w[redacted]d , admitted for  Pregnancy, Labor Management. Induction of labor due to gestational hypertension.  Subjective:  Epidural still providing relief, encouraged use of button as needed.  Objective:  BP 133/83   Pulse (!) 104   Temp 98.4 F (36.9 C) (Oral)   Resp 20   Ht 4\' 11"  (1.499 m)   Wt 76.7 kg   LMP 09/24/2017 (LMP Unknown)   SpO2 99%   BMI 34.13 kg/m  Abd: gravid, non-tender Extr: 1+ bilateral pedal edema SVE: 3/60/-3, AROM with light meconium noted  EFM: FHR: 135 bpm, variability: moderate,  accelerations:  Present,  decelerations:  Absent Toco: Frequency: Every 1.5-8 minutes, Duration: 40-240 seconds and Intensity: mild to moderate   Assessment & Plan:  G1P0000 @ [redacted]w[redacted]d, admitted for  Pregnancy and Labor/Delivery Management  1. Pain management: epidural. 2. FWB: FHT category I.  3. ID: GBS negative 4. Labor management: Start Pitocin again at lowest dose and titrate.  All discussed with patient, see orders.  Avel Sensor, CNM 06/12/2018

## 2018-06-12 NOTE — Op Note (Signed)
Cesarean Section Operative Note    Tamara Flynn   06/12/2018   Pre-operative Diagnosis:  1) Failed induction of labor 2) Fetal intolerance of labor 3) intrauterine pregnancy at [redacted]w[redacted]d  4) Gestational hypertension  Post-operative Diagnosis:  1) Failed induction of labor 2) Fetal intolerance of labor 3) intrauterine pregnancy at [redacted]w[redacted]d  4) Gestational hypertension   Procedure: Primary Low Transverse Cesarean Section via Pfannenstiel incision with double layer uterine closure  Surgeon: Surgeon(s) and Role:    Will Bonnet, MD - Primary   Assistants: Dalia Heading, CNM; No other capable assistant available, in surgery requiring high level assistant.  Anesthesia: epidural   Findings:  1) normal appearing gravid uterus, fallopian tubes, and ovaries 2) viable female infant with APGARs 8 and 9 and weight 3,350 (7 lb 6 oz)   Estimated Blood Loss: 600 mL (quantified blood loss pending)  Total IV Fluids: 1,100 ml   Urine Output: 150 mL clear urine at end of procedure  Specimens: none  Complications: no complications  Disposition: PACU - hemodynamically stable.   Maternal Condition: stable   Baby condition / location:  Couplet care / Skin to Skin  Procedure Details:  The patient was seen in the Holding Room. The risks, benefits, complications, treatment options, and expected outcomes were discussed with the patient. The patient concurred with the proposed plan, giving informed consent. identified as Tamara Flynn and the procedure verified as C-Section Delivery. A Time Out was held and the above information confirmed.   After induction of anesthesia, the patient was draped and prepped in the usual sterile manner. A Pfannenstiel incision was made and carried down through the subcutaneous tissue to the fascia. Fascial incision was made and extended transversely. The fascia was separated from the underlying rectus tissue superiorly and inferiorly. The peritoneum was  identified and entered. Peritoneal incision was extended longitudinally. The bladder flap was not bluntly or sharply freed from the lower uterine segment. A low transverse uterine incision was made and the hysterotomy was extended with cranial-caudal tension. Delivered from cephalic presentation was a 3,350 gram Living newborn infant(s) or Female with Apgar scores of 8 at one minute and 9 at five minutes. Cord ph was not sent the umbilical cord was clamped and cut cord blood was not obtained for evaluation. The placenta was removed Intact and appeared normal. The uterine outline, tubes and ovaries appeared normal. The uterine incision was closed with running locked sutures of 0 Vicryl.  A second layer of the same suture was thrown in an imbricating fashion.  Hemostasis was assured.  The uterus was returned to the abdomen and the paracolic gutters were cleared of all clots and debris.  The rectus muscles were inspected and found to be hemostatic.  The On-Q catheter pumps were inserted in accordance with the manufacturer's recommendations.  The catheters were inserted approximately 4cm cephelad to the incision line, approximately 1cm apart, straddling the midline.  They were inserted to a depth of the 4th mark. They were positioned superficial to the rectus abdominus muscles and deep to the rectus fascia.    The fascia was then reapproximated with running sutures of 1-0 PDS, looped. The subcuticular closure was performed using 4-0 monocryl. The skin closure was reinforced using surgical skin glue.  The On-Q catheters were bolused with 5 mL of 0.5% marcaine plain for a total of 10 mL.  The catheters were affixed to the skin with surgical skin glue, steri-strips, and tegaderm.    Instrument, sponge, and  needle counts were correct prior the abdominal closure and were correct at the conclusion of the case.  The patient received Ancef 2 gram and Azithromycin 500 mg IV prior to skin incision (within 30 minutes). For  VTE prophylaxis she was wearing SCDs throughout the case.  The assistant surgeon was an MD due to lack of availability of another Counselling psychologist.   Signed: Will Bonnet, MD 06/12/2018 2:28 PM

## 2018-06-12 NOTE — Progress Notes (Signed)
S: Called due to patient fever of 100.5 when returned to room after surgery. Temperature down to  99.6 after 650 mgm Tylenol. Uterus tender to massage. Had a 100.4 temperature at 1230 just prior to surgery.  BP 125/86 (BP Location: Right Arm)   Pulse (!) 106   Temp 99.6 F (37.6 C) (Oral)   Resp 14   Ht 4\' 11"  (1.499 m)   Wt 76.7 kg   LMP 09/24/2017 (LMP Unknown)   SpO2 98%   Breastfeeding Unknown   BMI 34.13 kg/m    Uterus tender to massage. Had a 100.4 temperature at 1230 just prior to surgery.  Good incisional pain control. Moving legs and turning with little pain Lochia WNL  A: Maternal fever postoperative-possible endomyometritis  P: Clindamycin 900 mgm q8 hours and gentamycin 5mg /kg every 24 hours Will add ampicillin if remains febrile after above started Dr Glennon Mac consulted.  Dalia Heading, CNM

## 2018-06-12 NOTE — Discharge Summary (Signed)
OB Discharge Summary     Patient Name: Tamara Flynn DOB: 1995-03-25 MRN: [id number]  Date of admission: 06/09/2018 Delivering MD: Prentice Docker, MD  Date of Delivery: 06/12/2018  Date of discharge: 06/14/2018  Admitting diagnosis:  Gestational hypertension Intrauterine pregnancy: [redacted]w[redacted]d     Secondary diagnosis: Gestational Hypertension     Discharge diagnosis: Term Pregnancy Delivered and Gestational Hypertension                                                                                                Post partum procedures: none  Augmentation: AROM, Pitocin, Cytotec and Foley Balloon  Complications: None  Hospital course:  Induction of Labor With Cesarean Section  23 y.o. yo G1P0000 at [redacted]w[redacted]d was admitted to the hospital 06/09/2018 for induction of labor. Patient had a labor course significant for gestation hypertension. She underwent induction of labor with cytotec (3 doses), foley balloon, AROM, pitocin.  After greater than 60 hours of induction she got no further than 3.5 cm.  The patient went for cesarean section due to failed induction of labor, fetal intolerance to induction of labor, and delivered a Viable infant,06/12/2018  Membrane Rupture Time/Date: 11:56 PM ,06/11/2018   Details of operation can be found in separate operative Note.  Patient had a postpartum course complicated by development of chorioamnionitis during labor and was treated with antibiotics. She was also diagnosed with severe preeclampsia based on her creatinine level and was on magnesium IV. She is ambulating, tolerating a regular diet, passing flatus, has had a BM and she is urinating well at time of discharge and denies any headache, visual changes or epigastric pain.  Patient is discharged home in stable condition on 06/14/18.                                    Physical exam  Vitals:   06/13/18 2110 06/13/18 2310 06/14/18 0300 06/14/18 0717  BP:  (!) 145/89 128/86 (!) 125/95  Pulse: (!) 120 (!) 125 99 91   Resp:  20 18 18   Temp: 99.6 F (37.6 C) 99.2 F (37.3 C) 98.4 F (36.9 C) 98.1 F (36.7 C)  TempSrc: Oral Oral Oral Oral  SpO2:  100% 99% 98%  Weight:      Height:       General: alert, cooperative and no distress Lochia: appropriate Uterine Fundus: firm Incision: Healing well with no significant drainage, Dressing is clean, dry, and intact, On Q pump intact DVT Evaluation: No evidence of DVT seen on physical exam.  Labs: Lab Results  Component Value Date   WBC 13.5 (H) 06/13/2018   HGB 9.4 (L) 06/13/2018   HCT 28.3 (L) 06/13/2018   MCV 84.2 06/13/2018   PLT 141 (L) 06/13/2018    Discharge instruction: per After Visit Summary.  Medications:  Allergies as of 06/14/2018      Reactions   Ciprofloxacin Hcl Hives      Medication List    STOP taking these medications   famotidine 20 MG tablet Commonly known as:  PEPCID     TAKE these medications   Flovent HFA 220 MCG/ACT inhaler Generic drug:  fluticasone INHALE 1 PUFF WITH SPACER TWICE A DAY FOR ASTHMA   oxyCODONE 5 MG immediate release tablet Commonly known as:  Oxy IR/ROXICODONE Take 1 tablet (5 mg total) by mouth every 6 (six) hours as needed for up to 5 days for moderate pain or severe pain.   prenatal multivitamin Tabs tablet Take 1 tablet by mouth daily at 12 noon. GUMMY   ProAir HFA 108 (90 Base) MCG/ACT inhaler Generic drug:  albuterol INHALE 2 PUFFS BY MOUTH EVERY 4 TO 6 HOURS AS NEEDED FOR WHEEZING, COUGH OR SHORTNESS OF BREATH            Discharge Care Instructions  (From admission, onward)         Start     Ordered   06/14/18 0000  Discharge wound care:    Comments:  Keep incision dry, clean.   06/14/18 1016          Diet: routine diet  Activity: Advance as tolerated. Pelvic rest for 6 weeks.   Outpatient follow up: Follow-up Information    Will Bonnet, MD. Go in 1 week(s).   Specialty:  Obstetrics and Gynecology Why:  Post op incision check and blood pressure  check Contact information: [address] [place] [address] [phone]             Postpartum contraception: None, mostly female partners Rhogam Given postpartum: no Rubella vaccine given postpartum: yes Varicella vaccine given postpartum: yes TDaP given antepartum or postpartum: 04/28/2018  Newborn Data: Live born female  "Ja-Ka-Ree (phonectic spelling)" Birth Weight: 7 lb 6.2 oz (3350 g) APGAR: 8, 9  Newborn Delivery   Birth date/time:  06/12/2018 13:49:00 Delivery type:  C-Section, Low Transverse Trial of labor:  Yes C-section categorization:  Primary      Baby Feeding: Formula  Disposition:home with mother  SIGNED: Christean Leaf, [facility] 06/14/2018, 10:17 AM

## 2018-06-12 NOTE — Progress Notes (Signed)
Called to see patient. Patient was admitted over 60 hours ago. She has undergone cervical ripening with misoprostol, foley balloon. She has had a few attempts at pitocin titration. She has had her membranes ruptured with an IUPC placed.  Recently she had two late decelerations and a recent fever to 100.73F.  Her heart rate is elevated. The fetal tracing is cat 1 at this time with normal baseline.  Variability is moderate, but on the low end.  She is actively asking for a c-section.  We discussed proceeding with a c-section. I believe she has a high likelihood of complications, if we continue with attempted vaginal delivery. She is showing some signs of likely chorioamnionitis.  I personally consented her to a cesarean section and she agreed to proceed with abdominal delivery.   Prentice Docker, MD, [facility], [facility] 06/12/2018 1:05 PM

## 2018-06-13 ENCOUNTER — Encounter: Payer: Self-pay | Admitting: Obstetrics and Gynecology

## 2018-06-13 DIAGNOSIS — O1415 Severe pre-eclampsia, complicating the puerperium: Secondary | ICD-10-CM | POA: Diagnosis not present

## 2018-06-13 LAB — BASIC METABOLIC PANEL
Anion gap: 12 (ref 5–15)
BUN: 13 mg/dL (ref 6–20)
CO2: 20 mmol/L — ABNORMAL LOW (ref 22–32)
Calcium: 7.7 mg/dL — ABNORMAL LOW (ref 8.9–10.3)
Chloride: 106 mmol/L (ref 98–111)
Creatinine, Ser: 1.33 mg/dL — ABNORMAL HIGH (ref 0.44–1.00)
GFR calc Af Amer: >60 mL/min (ref >60)
GFR calc non Af Amer: 56 mL/min — ABNORMAL LOW (ref >60)
Glucose, Bld: 103 mg/dL — ABNORMAL HIGH (ref 70–99)
Potassium: 3.3 mmol/L — ABNORMAL LOW (ref 3.5–5.1)
Sodium: 138 mmol/L (ref 135–145)

## 2018-06-13 LAB — CBC
HCT: 28.3 % — ABNORMAL LOW (ref 36.0–46.0)
Hemoglobin: 9.4 g/dL — ABNORMAL LOW (ref 12.0–15.0)
MCH: 28 pg (ref 26.0–34.0)
MCHC: 33.2 g/dL (ref 30.0–36.0)
MCV: 84.2 fL (ref 80.0–100.0)
Platelets: 141 10*3/uL — ABNORMAL LOW (ref 150–400)
RBC: 3.36 MIL/uL — ABNORMAL LOW (ref 3.87–5.11)
RDW: 13.7 % (ref 11.5–15.5)
WBC: 13.5 10*3/uL — ABNORMAL HIGH (ref 4.0–10.5)
nRBC: 0 % (ref 0.0–0.2)

## 2018-06-13 LAB — COMPREHENSIVE METABOLIC PANEL
ALT: 19 U/L (ref 0–44)
AST: 35 U/L (ref 15–41)
Albumin: 2.2 g/dL — ABNORMAL LOW (ref 3.5–5.0)
Alkaline Phosphatase: 299 U/L — ABNORMAL HIGH (ref 38–126)
Anion gap: 9 (ref 5–15)
BUN: 13 mg/dL (ref 6–20)
CO2: 22 mmol/L (ref 22–32)
Calcium: 7.6 mg/dL — ABNORMAL LOW (ref 8.9–10.3)
Chloride: 107 mmol/L (ref 98–111)
Creatinine, Ser: 1.35 mg/dL — ABNORMAL HIGH (ref 0.44–1.00)
GFR calc Af Amer: >60 mL/min (ref >60)
GFR calc non Af Amer: 55 mL/min — ABNORMAL LOW (ref >60)
Glucose, Bld: 80 mg/dL (ref 70–99)
Potassium: 3.2 mmol/L — ABNORMAL LOW (ref 3.5–5.1)
Sodium: 138 mmol/L (ref 135–145)
Total Bilirubin: 1 mg/dL (ref 0.3–1.2)
Total Protein: 4.9 g/dL — ABNORMAL LOW (ref 6.5–8.1)

## 2018-06-13 LAB — MAGNESIUM: Magnesium: 4.1 mg/dL — ABNORMAL HIGH (ref 1.7–2.4)

## 2018-06-13 LAB — PROTEIN / CREATININE RATIO, URINE
Creatinine, Urine: 101 mg/dL
Protein Creatinine Ratio: 0.79 mg/mg{Cre} — ABNORMAL HIGH (ref 0.00–0.15)
Total Protein, Urine: 80 mg/dL

## 2018-06-13 MED ORDER — LACTATED RINGERS IV SOLN
INTRAVENOUS | Status: DC
  Administered 2018-06-13 (×2): via INTRAVENOUS

## 2018-06-13 MED ORDER — MAGNESIUM SULFATE 40 G IN LACTATED RINGERS - SIMPLE
INTRAVENOUS | Status: AC
  Administered 2018-06-13: 4 g via INTRAVENOUS
  Filled 2018-06-13: qty 500

## 2018-06-13 MED ORDER — HYDRALAZINE HCL 20 MG/ML IJ SOLN: 5.0000 mg | INTRAMUSCULAR | Status: DC | PRN

## 2018-06-13 MED ORDER — MAGNESIUM SULFATE BOLUS VIA INFUSION
4.0000 g | Freq: Once | INTRAVENOUS | Status: AC
  Administered 2018-06-13: 07:00:00 4 g via INTRAVENOUS

## 2018-06-13 MED ORDER — HYDRALAZINE HCL 20 MG/ML IJ SOLN: 10.0000 mg | INTRAMUSCULAR | Status: DC | PRN

## 2018-06-13 MED ORDER — SODIUM CHLORIDE 0.9 % IV SOLN
2.0000 g | Freq: Four times a day (QID) | INTRAVENOUS | Status: DC
  Administered 2018-06-13 – 2018-06-14 (×5): 2 g via INTRAVENOUS
  Filled 2018-06-13 (×6): qty 2000
  Filled 2018-06-13: qty 2
  Filled 2018-06-13: qty 2000
  Filled 2018-06-13: qty 2
  Filled 2018-06-13: qty 2000

## 2018-06-13 MED ORDER — LABETALOL HCL 5 MG/ML IV SOLN
20.0000 mg | INTRAVENOUS | Status: DC | PRN
  Filled 2018-06-13: qty 4

## 2018-06-13 MED ORDER — MAGNESIUM SULFATE 40 G IN LACTATED RINGERS - SIMPLE: 1.0000 g/h | INTRAVENOUS | Status: DC

## 2018-06-13 MED ORDER — ACETAMINOPHEN 325 MG PO TABS: 650.0000 mg | ORAL_TABLET | Freq: Four times a day (QID) | ORAL | Status: DC | PRN

## 2018-06-13 MED ORDER — LABETALOL HCL 5 MG/ML IV SOLN
40.0000 mg | INTRAVENOUS | Status: DC | PRN
  Filled 2018-06-13: qty 8

## 2018-06-13 NOTE — Progress Notes (Signed)
Admit Date: 06/09/2018 Today's Date: 06/13/2018  Subjective: Postpartum Day 1: Cesarean Delivery    Then postpartum preeclampsia diagnosed, magnesium started 0700 this am Patient reports incisional pain and tolerating PO.  She is drowsy on the magnesium.    Denies current headache, blurry vision, CP, SOB, epigastric pain.  Mild edema.  Objective: Vital signs in last 24 hours: Temp:  [98.2 F (36.8 C)-100.5 F (38.1 C)] 98.2 F (36.8 C) (04/07 0728) Pulse Rate:  [81-118] 96 (04/07 1001) Resp:  [12-34] 18 (04/07 1001) BP: (112-153)/(63-105) 120/70 (04/07 1001) SpO2:  [95 %-100 %] 97 % (04/07 1001)  Physical Exam:  General: cooperative, appears stated age, fatigued and no distress Lochia: appropriate Uterine Fundus: firm Incision: healing well, no significant drainage, no dehiscence, no significant erythema DVT Evaluation: No evidence of DVT seen on physical exam. Negative Homan's sign. Chest CTA B DTR 1+ No clonus  Recent Labs    06/11/18 1650 06/13/18 0510  HGB 11.9* 9.4*  HCT 35.8* 28.3*   Results for orders placed or performed during the hospital encounter of 06/09/18  OB RESULTS CONSOLE Varicella zoster antibody, IgG  Result Value Ref Range   Varicella Nonimmune   RPR  Result Value Ref Range   RPR Ser Ql Non Reactive Non Reactive  Rapid HIV screen (HIV 1/2 Ab+Ag) ([facility] Only)  Result Value Ref Range   HIV-1 P24 Antigen - HIV24 NON REACTIVE NON REACTIVE   HIV 1/2 Antibodies NON REACTIVE NON REACTIVE   Interpretation (HIV Ag Ab)      A non reactive test result means that HIV 1 or HIV 2 antibodies and HIV 1 p24 antigen were not detected in the specimen.  CBC  Result Value Ref Range   WBC 11.3 (H) 4.0 - 10.5 K/uL   RBC 4.46 3.87 - 5.11 MIL/uL   Hemoglobin 12.5 12.0 - 15.0 g/dL   HCT 38.1 36.0 - 46.0 %   MCV 85.4 80.0 - 100.0 fL   MCH 28.0 26.0 - 34.0 pg   MCHC 32.8 30.0 - 36.0 g/dL   RDW 13.7 11.5 - 15.5 %   Platelets 146 (L) 150 - 400 K/uL   nRBC 0.0 0.0 - 0.2  %  CBC  Result Value Ref Range   WBC 13.2 (H) 4.0 - 10.5 K/uL   RBC 4.20 3.87 - 5.11 MIL/uL   Hemoglobin 11.9 (L) 12.0 - 15.0 g/dL   HCT 35.8 (L) 36.0 - 46.0 %   MCV 85.2 80.0 - 100.0 fL   MCH 28.3 26.0 - 34.0 pg   MCHC 33.2 30.0 - 36.0 g/dL   RDW 13.9 11.5 - 15.5 %   Platelets 185 150 - 400 K/uL   nRBC 0.0 0.0 - 0.2 %  CBC  Result Value Ref Range   WBC 13.5 (H) 4.0 - 10.5 K/uL   RBC 3.36 (L) 3.87 - 5.11 MIL/uL   Hemoglobin 9.4 (L) 12.0 - 15.0 g/dL   HCT 28.3 (L) 36.0 - 46.0 %   MCV 84.2 80.0 - 100.0 fL   MCH 28.0 26.0 - 34.0 pg   MCHC 33.2 30.0 - 36.0 g/dL   RDW 13.7 11.5 - 15.5 %   Platelets 141 (L) 150 - 400 K/uL   nRBC 0.0 0.0 - 0.2 %  Comprehensive metabolic panel  Result Value Ref Range   Sodium 138 135 - 145 mmol/L   Potassium 3.2 (L) 3.5 - 5.1 mmol/L   Chloride 107 98 - 111 mmol/L   CO2 22 22 -  32 mmol/L   Glucose, Bld 80 70 - 99 mg/dL   BUN 13 6 - 20 mg/dL   Creatinine, Ser 1.35 (H) 0.44 - 1.00 mg/dL   Calcium 7.6 (L) 8.9 - 10.3 mg/dL   Total Protein 4.9 (L) 6.5 - 8.1 g/dL   Albumin 2.2 (L) 3.5 - 5.0 g/dL   AST 35 15 - 41 U/L   ALT 19 0 - 44 U/L   Alkaline Phosphatase 299 (H) 38 - 126 U/L   Total Bilirubin 1.0 0.3 - 1.2 mg/dL   GFR calc non Af Amer 55 (L) >60 mL/min   GFR calc Af Amer >60 >60 mL/min   Anion gap 9 5 - 15  Protein / creatinine ratio, urine  Result Value Ref Range   Creatinine, Urine 101 mg/dL   Total Protein, Urine 80 mg/dL   Protein Creatinine Ratio 0.79 (H) 0.00 - 0.15 mg/mg[Cre]  Sample to Blood Bank  Result Value Ref Range   Blood Bank Specimen SAMPLE AVAILABLE FOR TESTING    Sample Expiration      06/12/2018 Performed at [facility], [address] [place], [place] [address]   Type and screen [place]  Result Value Ref Range   ABO/RH(D) B POS    Antibody Screen NEG    Sample Expiration      06/12/2018 Performed at [facility], [address] [place], [place] [address]    UOP-  > 200 mL/hour  Assessment/Plan: Status post Cesarean section. Postoperative course complicated by preeclampsia  Continue current care.    Magnesium for at most 24 hours    Analgesia    Monitor BPs, in normal range currently    Recheck Cr and labs today as Creatanine was elevated and cause for severe preeclampsia diagnosis       Hoyt Koch 06/13/2018, 10:09 AM

## 2018-06-13 NOTE — Progress Notes (Signed)
Paged Jaclyn Shaggy, CNM about patients' elevated blood pressure and new onset blurred vision. No new orders given. RN to recheck blood pressure.

## 2018-06-13 NOTE — Progress Notes (Signed)
Admit Date: 06/09/2018 Today's Date: 06/13/2018  Subjective: Patient reports tolerating PO.  Magnesium burn in her IV and she is groggy most of day. Denies headache, blurry vision, epigastric pain, CP, SOB.  Objective: Vital signs in last 24 hours: Temp:  [98.2 F (36.8 C)-100.1 F (37.8 C)] 98.7 F (37.1 C) (04/07 1701) Pulse Rate:  [81-113] 113 (04/07 1701) Resp:  [16-18] 17 (04/07 1701) BP: (115-153)/(70-105) 132/85 (04/07 1701) SpO2:  [94 %-100 %] 98 % (04/07 1701)  Physical Exam:  General: alert, cooperative and no distress Lochia: appropriate Uterine Fundus: firm Incision: healing well, no significant drainage, no dehiscence, no significant erythema DVT Evaluation: No evidence of DVT seen on physical exam.  Recent Labs    06/11/18 1650 06/13/18 0510  HGB 11.9* 9.4*  HCT 35.8* 28.3*   UOP >200 mL/hr  Assessment/Plan: Status post Cesarean section. Doing well postoperatively.  Continue current care. Stop magnesium at this time Monitor BPs. Transfer to [facility] floor  Hoyt Koch 06/13/2018, 6:43 PM

## 2018-06-13 NOTE — Anesthesia Post-op Follow-up Note (Signed)
  Anesthesia Pain Follow-up Note  Patient: Tamara Flynn  Day #: 1  Date of Follow-up: 06/13/2018 Time: 7:33 AM  Last Vitals:  Vitals:   06/13/18 0325 06/13/18 0728  BP: (!) 120/94   Pulse: 97   Resp: 17   Temp: 36.8 C 36.8 C  SpO2: 97%     Level of Consciousness: alert  Pain: none   Side Effects:None  Catheter Site Exam:clean, dry, no drainage     Plan: D/C from anesthesia care at surgeon's request  [name]

## 2018-06-13 NOTE — Anesthesia Postprocedure Evaluation (Signed)
Anesthesia Post Note  Patient: Tamara Flynn  Procedure(s) Performed: CESAREAN SECTION (N/A )  Patient location during evaluation: Mother Baby Anesthesia Type: Epidural Level of consciousness: awake and alert Pain management: pain level controlled Vital Signs Assessment: post-procedure vital signs reviewed and stable Respiratory status: spontaneous breathing, nonlabored ventilation and respiratory function stable Cardiovascular status: stable Postop Assessment: no headache, no backache and epidural receding Anesthetic complications: no     Last Vitals:  Vitals:   06/13/18 0325 06/13/18 0728  BP: (!) 120/94   Pulse: 97   Resp: 17   Temp: 36.8 C 36.8 C  SpO2: 97%     Last Pain:  Vitals:   06/13/18 0728  TempSrc: Oral  PainSc:                  [name]

## 2018-06-13 NOTE — Progress Notes (Signed)
POD #1 LTCS for failed induction/ FITL. Gestational hypertension Subjective:   Tired. Complained of some blurry vision last night around midnight.  Denies headache. Nausea resolved with phenergan. Tolerating liquids. Has not wanted solid foods.No chest pain or SOB.   Objective:  Blood pressure (!) 120/94, pulse 97, temperature 98.3 F (36.8 C), temperature source Oral, resp. rate 17, height 4\' 11"  (1.499 m), weight 76.7 kg, last menstrual period 09/24/2017, SpO2 97 %, Patient Vitals for the past 24 hrs:  BP Temp Temp src Pulse Resp SpO2  06/13/18 0325 (!) 120/94 98.3 F (36.8 C) Oral 97 17 97 %  06/13/18 0200 - - - (!) 104 - 98 %  06/13/18 0115 127/88 - - 93 - 97 %  06/13/18 0100 - - - (!) 102 - 97 %  06/13/18 0000 - - - (!) 105 - 96 %  06/12/18 2350 (!) 153/93 100.1 F (37.8 C) Oral (!) 108 18 95 %  06/12/18 2200 - - - (!) 103 - 96 %  06/12/18 2037 - 98.9 F (37.2 C) Oral - 18 -  06/12/18 2000 - - - (!) 103 - 98 %  06/12/18 1940 (!) 151/105 99 F (37.2 C) Oral (!) 102 18 99 %  06/12/18 1736 (!) 151/92 99.5 F (37.5 C) Oral 99 20 98 %  06/12/18 1705 - - - 94 - 99 %  06/12/18 1704 - - - - - 100 %  06/12/18 1657 - - - (!) 115 - 95 %  06/12/18 1656 - - - (!) 103 - 96 %  06/12/18 1655 - - - (!) 108 (!) 21 99 %  06/12/18 1654 - - - (!) 107 - 100 %  06/12/18 1653 - - - 100 - 98 %  06/12/18 1652 - - - (!) 105 13 98 %  06/12/18 1651 - 98.6 F (37 C) Oral (!) 103 13 97 %  06/12/18 1650 - - - 100 20 97 %  06/12/18 1649 - - - (!) 104 18 97 %  06/12/18 1648 - - - (!) 103 14 96 %  06/12/18 1647 - - - (!) 102 (!) 29 96 %  06/12/18 1646 - - - (!) 102 (!) 23 96 %  06/12/18 1645 - - - (!) 102 (!) 26 96 %  06/12/18 1644 - - - 99 (!) 21 96 %  06/12/18 1643 - - - 98 (!) 23 97 %  06/12/18 1642 - - - (!) 110 17 98 %  06/12/18 1641 - - - (!) 106 13 98 %  06/12/18 1640 - - - 94 (!) 22 97 %  06/12/18 1639 - - - 99 15 98 %  06/12/18 1638 - - - (!) 101 (!) 21 97 %  06/12/18 1637 - - - (!) 101  (!) 21 98 %  06/12/18 1636 - - - (!) 106 (!) 23 99 %  06/12/18 1635 - - - (!) 104 14 98 %  06/12/18 1634 - - - 99 12 98 %  06/12/18 1633 - - - (!) 103 16 98 %  06/12/18 1632 - - - (!) 104 15 97 %  06/12/18 1631 - - - (!) 102 15 98 %  06/12/18 1630 (!) 142/82 - - 100 18 99 %  06/12/18 1629 - - - (!) 105 13 99 %  06/12/18 1628 - - - (!) 105 - 97 %  06/12/18 1627 - - - 95 - 100 %  06/12/18 1626 - - - Marland Kitchen)  104 14 98 %  06/12/18 1625 - - - (!) 106 17 98 %  06/12/18 1624 - - - (!) 106 14 98 %  06/12/18 1623 - - - (!) 106 (!) 23 98 %  06/12/18 1622 - - - (!) 103 (!) 25 98 %  06/12/18 1621 - - - (!) 102 20 98 %  06/12/18 1620 - - - (!) 106 (!) 23 98 %  06/12/18 1619 - - - (!) 108 18 97 %  06/12/18 1618 - - - (!) 101 19 99 %  06/12/18 1617 - - - (!) 103 18 100 %  06/12/18 1616 - - - (!) 102 (!) 22 98 %  06/12/18 1615 - - - 99 (!) 22 99 %  06/12/18 1614 - - - (!) 103 (!) 21 97 %  06/12/18 1613 - - - (!) 109 18 98 %  06/12/18 1612 - - - (!) 103 (!) 29 97 %  06/12/18 1611 - - - (!) 103 (!) 25 97 %  06/12/18 1610 - - - (!) 103 (!) 21 96 %  06/12/18 1609 - - - (!) 103 (!) 22 96 %  06/12/18 1608 - - - (!) 103 (!) 28 97 %  06/12/18 1607 - - - (!) 102 20 99 %  06/12/18 1606 - - - (!) 101 (!) 22 98 %  06/12/18 1605 - - - (!) 105 20 99 %  06/12/18 1604 - - - (!) 101 18 98 %  06/12/18 1603 - - - (!) 102 (!) 21 98 %  06/12/18 1602 125/86 99.6 F (37.6 C) Oral (!) 109 17 98 %  06/12/18 1601 - 100.3 F (37.9 C) Axillary (!) 101 15 97 %  06/12/18 1600 - - - (!) 113 20 98 %  06/12/18 1559 - - - (!) 101 (!) 29 98 %  06/12/18 1558 - - - (!) 103 (!) 23 98 %  06/12/18 1557 - - - 100 (!) 31 98 %  06/12/18 1556 - - - 99 (!) 22 98 %  06/12/18 1555 - - - (!) 105 20 98 %  06/12/18 1554 - - - (!) 101 (!) 21 97 %  06/12/18 1553 - - - (!) 101 (!) 27 98 %  06/12/18 1552 - - - 98 (!) 26 98 %  06/12/18 1551 - - - 99 (!) 30 99 %  06/12/18 1550 - - - (!) 109 (!) 25 99 %  06/12/18 1549 - - - (!) 108 (!) 23 98  %  06/12/18 1548 - - - (!) 106 (!) 34 100 %  06/12/18 1547 119/82 - - 95 (!) 30 99 %  06/12/18 1546 - - - (!) 103 20 99 %  06/12/18 1545 - - - (!) 110 15 98 %  06/12/18 1544 - - - (!) 104 15 98 %  06/12/18 1543 - - - 96 15 100 %  06/12/18 1542 - - - 99 18 99 %  06/12/18 1541 - - - 98 15 98 %  06/12/18 1540 - - - 98 (!) 23 99 %  06/12/18 1539 - - - (!) 105 (!) 27 98 %  06/12/18 1538 - - - (!) 104 18 99 %  06/12/18 1537 - - - (!) 103 16 98 %  06/12/18 1536 - - - (!) 103 20 97 %  06/12/18 1535 - - - 98 17 99 %  06/12/18 1534 - - - (!) 103 15 100 %  06/12/18 1533 - - - (!) 112 16 98 %  06/12/18 1532 - - - (!) 101 16 99 %  06/12/18 1531 - - - (!) 108 16 99 %  06/12/18 1530 (!) 130/93 - - (!) 102 12 100 %  06/12/18 1529 - - - (!) 101 19 99 %  06/12/18 1528 - - - (!) 102 19 99 %  06/12/18 1527 - - - 99 (!) 26 99 %  06/12/18 1526 - - - (!) 101 13 99 %  06/12/18 1525 - - - (!) 101 (!) 21 99 %  06/12/18 1524 - - - (!) 101 (!) 26 99 %  06/12/18 1523 - - - (!) 102 (!) 23 99 %  06/12/18 1522 - - - (!) 102 19 99 %  06/12/18 1521 - - - (!) 102 (!) 23 99 %  06/12/18 1520 - - - (!) 101 16 99 %  06/12/18 1519 - - - (!) 105 18 99 %  06/12/18 1518 - - - (!) 107 19 99 %  06/12/18 1517 - - - (!) 108 19 99 %  06/12/18 1516 - - - (!) 102 (!) 21 100 %  06/12/18 1515 (!) 136/97 - - (!) 108 19 98 %  06/12/18 1514 - - - (!) 104 (!) 24 99 %  06/12/18 1513 - - - (!) 109 (!) 21 99 %  06/12/18 1512 - - - (!) 106 20 100 %  06/12/18 1511 - - - (!) 103 (!) 23 100 %  06/12/18 1510 - - - (!) 107 (!) 24 100 %  06/12/18 1509 - - - (!) 105 (!) 21 100 %  06/12/18 1508 - - - (!) 108 (!) 24 100 %  06/12/18 1507 - - - (!) 115 (!) 24 98 %  06/12/18 1506 - - - (!) 109 (!) 24 99 %  06/12/18 1505 - - - (!) 110 (!) 22 99 %  06/12/18 1504 - - - (!) 108 19 99 %  06/12/18 1503 - - - (!) 113 19 99 %  06/12/18 1502 - - - (!) 115 (!) 21 99 %  06/12/18 1501 - - - (!) 112 20 99 %  06/12/18 1500 130/88 - - (!) 109 19 99 %   06/12/18 1459 - - - (!) 113 19 98 %  06/12/18 1458 - - - (!) 110 20 99 %  06/12/18 1457 - - - (!) 117 (!) 22 99 %  06/12/18 1456 - - - (!) 113 (!) 22 98 %  06/12/18 1455 130/88 - - (!) 110 (!) 22 98 %  06/12/18 1454 - - - (!) 110 20 99 %  06/12/18 1453 - - - (!) 109 (!) 22 99 %  06/12/18 1452 - - - (!) 111 (!) 22 99 %  06/12/18 1451 - - - (!) 118 18 100 %  06/12/18 1450 132/86 - - (!) 109 (!) 30 99 %  06/12/18 1449 - - - (!) 115 (!) 27 98 %  06/12/18 1445 112/63 - - - - -  06/12/18 1444 112/63 100.3 F (37.9 C) Oral - 18 -  06/12/18 1315 - - - - - 99 %  06/12/18 1310 - - - - - 98 %  06/12/18 1305 - - - - - 98 %  06/12/18 1300 - - - - - 99 %  06/12/18 1235 - (!) 100.5 F (38.1 C) Oral - - -  06/12/18 1118 - 98.4 F (36.9  C) Oral - - -  06/12/18 0935 139/89 98.5 F (36.9 C) Oral 92 18 97 %  06/12/18 0733 127/84 98.3 F (36.8 C) - (!) 103 18 -   Intake: 2534ml IV + po Urine output: 1700 ml on the evening shift+ 1650=3350 ml EBL: 785 ml  General: awakened from sleeping, in NAD Heart: mildly tachycardic at 100 BPM, no murmurs Pulmonary: no increased work of breathing/ CTA at bases Incision: Honeycomb dressing C&D&I Lochia: small lochia Extremities:  Decreasing edema, no erythema, no tenderness DTRS: +2  Results for orders placed or performed during the hospital encounter of 06/09/18 (from the past 72 hour(s))  CBC     Status: Abnormal   Collection Time: 06/11/18  4:50 PM  Result Value Ref Range   WBC 13.2 (H) 4.0 - 10.5 K/uL   RBC 4.20 3.87 - 5.11 MIL/uL   Hemoglobin 11.9 (L) 12.0 - 15.0 g/dL   HCT 35.8 (L) 36.0 - 46.0 %   MCV 85.2 80.0 - 100.0 fL   MCH 28.3 26.0 - 34.0 pg   MCHC 33.2 30.0 - 36.0 g/dL   RDW 13.9 11.5 - 15.5 %   Platelets 185 150 - 400 K/uL   nRBC 0.0 0.0 - 0.2 %    Comment: Performed at [facility], [address] [place], [place] [address]  Protein / creatinine ratio, urine     Status: Abnormal   Collection Time: 06/13/18  3:18 AM   Result Value Ref Range   Creatinine, Urine 101 mg/dL   Total Protein, Urine 80 mg/dL    Comment: NO NORMAL RANGE ESTABLISHED FOR THIS TEST   Protein Creatinine Ratio 0.79 (H) 0.00 - 0.15 mg/mg[Cre]    Comment: Performed at [facility], [address] [place], [place] [address]  CBC     Status: Abnormal   Collection Time: 06/13/18  5:10 AM  Result Value Ref Range   WBC 13.5 (H) 4.0 - 10.5 K/uL   RBC 3.36 (L) 3.87 - 5.11 MIL/uL   Hemoglobin 9.4 (L) 12.0 - 15.0 g/dL   HCT 28.3 (L) 36.0 - 46.0 %   MCV 84.2 80.0 - 100.0 fL   MCH 28.0 26.0 - 34.0 pg   MCHC 33.2 30.0 - 36.0 g/dL   RDW 13.7 11.5 - 15.5 %   Platelets 141 (L) 150 - 400 K/uL   nRBC 0.0 0.0 - 0.2 %    Comment: Performed at [facility], [address] [place], [place] [address]  Comprehensive metabolic panel     Status: Abnormal   Collection Time: 06/13/18  5:10 AM  Result Value Ref Range   Sodium 138 135 - 145 mmol/L   Potassium 3.2 (L) 3.5 - 5.1 mmol/L   Chloride 107 98 - 111 mmol/L   CO2 22 22 - 32 mmol/L   Glucose, Bld 80 70 - 99 mg/dL   BUN 13 6 - 20 mg/dL   Creatinine, Ser 1.35 (H) 0.44 - 1.00 mg/dL   Calcium 7.6 (L) 8.9 - 10.3 mg/dL   Total Protein 4.9 (L) 6.5 - 8.1 g/dL   Albumin 2.2 (L) 3.5 - 5.0 g/dL   AST 35 15 - 41 U/L   ALT 19 0 - 44 U/L   Alkaline Phosphatase 299 (H) 38 - 126 U/L   Total Bilirubin 1.0 0.3 - 1.2 mg/dL   GFR calc non Af Amer 55 (L) >60 mL/min   GFR calc Af Amer >60 >60 mL/min   Anion gap 9  5 - 15    Comment: Performed at [facility], [address] [place], [place] [address]     Assessment:   23 y.o. G1P1001 postoperativeday # 1. Now with preeclampsia with severe features (creatinine>1.1, blurred vision) Good urine output despite creatinine of 1.35 Mild range blood pressure elevations   Plan:  1) Consulted Dr Glennon Mac. Transfer back to L&D for magnesium sulfate administration for seizure prophylaxis  4 GM bolus, then 1 Gm/hr  Repeat creatinine  and get magnesium level at 1300  Monitor for magnesium toxicity, pulmonary edema  Urine output q1 hour. Keep foley to SD  Monitor blood pressures every 1 hour  SCDs, encourage movement of legs  OOB in chair today 2) Acute blood loss anemia - hemodynamically stable - po ferrous sulfate  3) B POS/ RNI/VNI  4) TDAP UTD  4) Breast/Bottle/Contraception (none)  Dalia Heading, CNM

## 2018-06-14 MED ORDER — OXYCODONE HCL 5 MG PO TABS: 5.0000 mg | ORAL_TABLET | Freq: Four times a day (QID) | ORAL | 0 refills | Status: DC | PRN

## 2018-06-14 NOTE — Progress Notes (Signed)
RX given pain med for home use.

## 2018-06-14 NOTE — Progress Notes (Signed)
Fresh guaze dsg applied to site where On Q pump was.  Site unremarkable.

## 2018-06-14 NOTE — Progress Notes (Signed)
Pt decided that she didn't want to take vaccines.  Encourage to take them prior to discharge to help with protection on future pregnancies.  Pt declined

## 2018-06-14 NOTE — Progress Notes (Signed)
DC teaching complete.  Reviewed meds and f/u and red warning signs.  Pt verbalizes u/o

## 2018-06-19 ENCOUNTER — Emergency Department: Payer: Medicaid Other

## 2018-06-19 ENCOUNTER — Encounter: Payer: Self-pay | Admitting: Intensive Care

## 2018-06-19 ENCOUNTER — Inpatient Hospital Stay
Admission: EM | Admit: 2018-06-19 | Discharge: 2018-06-21 | Disposition: A | Discharge status: Home / Self Care | Payer: Medicaid Other | Attending: Obstetrics and Gynecology | Admitting: Obstetrics and Gynecology

## 2018-06-19 ENCOUNTER — Other Ambulatory Visit: Payer: Self-pay

## 2018-06-19 DIAGNOSIS — O1490 Unspecified pre-eclampsia, unspecified trimester: Secondary | ICD-10-CM

## 2018-06-19 DIAGNOSIS — R0602 Shortness of breath: Secondary | ICD-10-CM | POA: Diagnosis present

## 2018-06-19 DIAGNOSIS — O1495 Unspecified pre-eclampsia, complicating the puerperium: Principal | ICD-10-CM | POA: Diagnosis present

## 2018-06-19 LAB — URINALYSIS, COMPLETE (UACMP) WITH MICROSCOPIC
Bilirubin Urine: NEGATIVE
Glucose, UA: NEGATIVE mg/dL
Ketones, ur: NEGATIVE mg/dL
Nitrite: NEGATIVE
Protein, ur: NEGATIVE mg/dL
Specific Gravity, Urine: 1.013 (ref 1.005–1.030)
pH: 6 (ref 5.0–8.0)

## 2018-06-19 LAB — CBC WITH DIFFERENTIAL/PLATELET
Abs Immature Granulocytes: 0.18 10*3/uL — ABNORMAL HIGH (ref 0.00–0.07)
Basophils Absolute: 0 10*3/uL (ref 0.0–0.1)
Basophils Relative: 0 %
Eosinophils Absolute: 0.2 10*3/uL (ref 0.0–0.5)
Eosinophils Relative: 2 %
HCT: 35.4 % — ABNORMAL LOW (ref 36.0–46.0)
Hemoglobin: 11.4 g/dL — ABNORMAL LOW (ref 12.0–15.0)
Immature Granulocytes: 2 %
Lymphocytes Relative: 16 %
Lymphs Abs: 1.8 10*3/uL (ref 0.7–4.0)
MCH: 28.3 pg (ref 26.0–34.0)
MCHC: 32.2 g/dL (ref 30.0–36.0)
MCV: 87.8 fL (ref 80.0–100.0)
Monocytes Absolute: 0.5 10*3/uL (ref 0.1–1.0)
Monocytes Relative: 4 %
Neutro Abs: 8.6 10*3/uL — ABNORMAL HIGH (ref 1.7–7.7)
Neutrophils Relative %: 76 %
Platelets: 519 10*3/uL — ABNORMAL HIGH (ref 150–400)
RBC: 4.03 MIL/uL (ref 3.87–5.11)
RDW: 14.6 % (ref 11.5–15.5)
WBC: 11.3 10*3/uL — ABNORMAL HIGH (ref 4.0–10.5)
nRBC: 0 % (ref 0.0–0.2)

## 2018-06-19 LAB — BASIC METABOLIC PANEL
Anion gap: 9 (ref 5–15)
BUN: 15 mg/dL (ref 6–20)
CO2: 21 mmol/L — ABNORMAL LOW (ref 22–32)
Calcium: 8.8 mg/dL — ABNORMAL LOW (ref 8.9–10.3)
Chloride: 108 mmol/L (ref 98–111)
Creatinine, Ser: 0.96 mg/dL (ref 0.44–1.00)
GFR calc Af Amer: >60 mL/min (ref >60)
GFR calc non Af Amer: >60 mL/min (ref >60)
Glucose, Bld: 87 mg/dL (ref 70–99)
Potassium: 4.2 mmol/L (ref 3.5–5.1)
Sodium: 138 mmol/L (ref 135–145)

## 2018-06-19 LAB — PROTEIN / CREATININE RATIO, URINE
Creatinine, Urine: 76 mg/dL
Protein Creatinine Ratio: 0.22 mg/mg{Cre} — ABNORMAL HIGH (ref 0.00–0.15)
Total Protein, Urine: 17 mg/dL

## 2018-06-19 LAB — PROTIME-INR
INR: 1.1 (ref 0.8–1.2)
Prothrombin Time: 13.6 seconds (ref 11.4–15.2)

## 2018-06-19 LAB — HEPATIC FUNCTION PANEL
ALT: 64 U/L — ABNORMAL HIGH (ref 0–44)
AST: 50 U/L — ABNORMAL HIGH (ref 15–41)
Albumin: 3.3 g/dL — ABNORMAL LOW (ref 3.5–5.0)
Alkaline Phosphatase: 228 U/L — ABNORMAL HIGH (ref 38–126)
Bilirubin, Direct: 0.1 mg/dL (ref 0.0–0.2)
Indirect Bilirubin: 0.5 mg/dL (ref 0.3–0.9)
Total Bilirubin: 0.6 mg/dL (ref 0.3–1.2)
Total Protein: 7.7 g/dL (ref 6.5–8.1)

## 2018-06-19 LAB — MAGNESIUM: Magnesium: 1.6 mg/dL — ABNORMAL LOW (ref 1.7–2.4)

## 2018-06-19 MED ORDER — HYDRALAZINE HCL 20 MG/ML IJ SOLN: 10.0000 mg | INTRAMUSCULAR | Status: DC | PRN

## 2018-06-19 MED ORDER — MAGNESIUM SULFATE 40 G IN LACTATED RINGERS - SIMPLE: 2.0000 g/h | INTRAVENOUS | Status: DC

## 2018-06-19 MED ORDER — LACTATED RINGERS IV SOLN
INTRAVENOUS | Status: DC
  Administered 2018-06-20: 16:00:00 via INTRAVENOUS

## 2018-06-19 MED ORDER — FLUTICASONE PROPIONATE HFA 220 MCG/ACT IN AERO: 1.0000 | INHALATION_SPRAY | Freq: Two times a day (BID) | RESPIRATORY_TRACT | Status: DC

## 2018-06-19 MED ORDER — LACTATED RINGERS IV SOLN
INTRAVENOUS | Status: DC
  Administered 2018-06-19: 19:00:00 via INTRAVENOUS

## 2018-06-19 MED ORDER — ALBUTEROL SULFATE (2.5 MG/3ML) 0.083% IN NEBU: 2.5000 mg | INHALATION_SOLUTION | RESPIRATORY_TRACT | Status: DC | PRN

## 2018-06-19 MED ORDER — LABETALOL HCL 5 MG/ML IV SOLN
20.0000 mg | INTRAVENOUS | Status: DC | PRN
  Filled 2018-06-19: qty 4

## 2018-06-19 MED ORDER — ACETAMINOPHEN 500 MG PO TABS
1000.0000 mg | ORAL_TABLET | Freq: Four times a day (QID) | ORAL | Status: DC
  Administered 2018-06-19 – 2018-06-21 (×5): 1000 mg via ORAL
  Filled 2018-06-19 (×5): qty 2

## 2018-06-19 MED ORDER — LABETALOL HCL 5 MG/ML IV SOLN: 40.0000 mg | INTRAVENOUS | Status: DC | PRN

## 2018-06-19 MED ORDER — IOHEXOL 350 MG/ML SOLN
75.0000 mL | Freq: Once | INTRAVENOUS | Status: AC | PRN
  Administered 2018-06-19: 75 mL via INTRAVENOUS
  Filled 2018-06-19: qty 75

## 2018-06-19 MED ORDER — LABETALOL HCL 5 MG/ML IV SOLN
40.0000 mg | INTRAVENOUS | Status: DC | PRN
  Filled 2018-06-19: qty 8

## 2018-06-19 MED ORDER — ONDANSETRON HCL 4 MG PO TABS: 4.0000 mg | ORAL_TABLET | Freq: Four times a day (QID) | ORAL | Status: DC | PRN

## 2018-06-19 MED ORDER — SODIUM CHLORIDE 0.9 % IV BOLUS
1000.0000 mL | Freq: Once | INTRAVENOUS | Status: AC
  Administered 2018-06-19: 1000 mL via INTRAVENOUS

## 2018-06-19 MED ORDER — LABETALOL HCL 200 MG PO TABS
200.0000 mg | ORAL_TABLET | Freq: Three times a day (TID) | ORAL | Status: DC
  Administered 2018-06-19 – 2018-06-21 (×5): 200 mg via ORAL
  Filled 2018-06-19 (×3): qty 1
  Filled 2018-06-19: qty 2
  Filled 2018-06-19: qty 1

## 2018-06-19 MED ORDER — MAGNESIUM SULFATE BOLUS VIA INFUSION
4.0000 g | Freq: Once | INTRAVENOUS | Status: DC
  Filled 2018-06-19: qty 500

## 2018-06-19 MED ORDER — ENOXAPARIN SODIUM 40 MG/0.4ML ~~LOC~~ SOLN
40.0000 mg | SUBCUTANEOUS | Status: DC
  Administered 2018-06-19 – 2018-06-20 (×2): 40 mg via SUBCUTANEOUS
  Filled 2018-06-19 (×3): qty 0.4

## 2018-06-19 MED ORDER — IBUPROFEN 600 MG PO TABS: 600.0000 mg | ORAL_TABLET | Freq: Four times a day (QID) | ORAL | Status: DC | PRN

## 2018-06-19 MED ORDER — OXYCODONE HCL 5 MG PO TABS
5.0000 mg | ORAL_TABLET | Freq: Four times a day (QID) | ORAL | Status: DC | PRN
  Administered 2018-06-19: 5 mg via ORAL
  Filled 2018-06-19: qty 1

## 2018-06-19 MED ORDER — PRENATAL MULTIVITAMIN CH
1.0000 | ORAL_TABLET | Freq: Every day | ORAL | Status: DC
  Filled 2018-06-19: qty 1

## 2018-06-19 MED ORDER — MAGNESIUM SULFATE 2 GM/50ML IV SOLN
2.0000 g | Freq: Once | INTRAVENOUS | Status: AC
  Administered 2018-06-19: 2 g via INTRAVENOUS

## 2018-06-19 MED ORDER — ONDANSETRON HCL 4 MG/2ML IJ SOLN: 4.0000 mg | Freq: Four times a day (QID) | INTRAMUSCULAR | Status: DC | PRN

## 2018-06-19 MED ORDER — LABETALOL HCL 5 MG/ML IV SOLN: 20.0000 mg | INTRAVENOUS | Status: DC | PRN

## 2018-06-19 MED ORDER — LABETALOL HCL 5 MG/ML IV SOLN
80.0000 mg | INTRAVENOUS | Status: DC | PRN
  Filled 2018-06-19: qty 16

## 2018-06-19 MED ORDER — IBUPROFEN 600 MG PO TABS
600.0000 mg | ORAL_TABLET | Freq: Four times a day (QID) | ORAL | Status: DC
  Administered 2018-06-19 – 2018-06-20 (×2): 600 mg via ORAL
  Filled 2018-06-19 (×2): qty 1

## 2018-06-19 MED ORDER — MAGNESIUM SULFATE 2 GM/50ML IV SOLN
2.0000 g | Freq: Once | INTRAVENOUS | Status: DC
  Filled 2018-06-19: qty 50

## 2018-06-19 MED ORDER — DOCUSATE SODIUM 100 MG PO CAPS
100.0000 mg | ORAL_CAPSULE | Freq: Two times a day (BID) | ORAL | Status: DC
  Administered 2018-06-19 – 2018-06-20 (×2): 100 mg via ORAL
  Filled 2018-06-19 (×4): qty 1

## 2018-06-19 MED ORDER — BUDESONIDE 0.5 MG/2ML IN SUSP
0.5000 mg | Freq: Two times a day (BID) | RESPIRATORY_TRACT | Status: DC
  Administered 2018-06-19: 0.5 mg via RESPIRATORY_TRACT
  Filled 2018-06-19 (×4): qty 2

## 2018-06-19 MED ORDER — ALBUTEROL SULFATE (2.5 MG/3ML) 0.083% IN NEBU
2.5000 mg | INHALATION_SOLUTION | Freq: Four times a day (QID) | RESPIRATORY_TRACT | Status: DC
  Administered 2018-06-19 – 2018-06-20 (×2): 2.5 mg via RESPIRATORY_TRACT
  Filled 2018-06-19 (×2): qty 3

## 2018-06-19 MED ORDER — MAGNESIUM SULFATE 40 G IN LACTATED RINGERS - SIMPLE
INTRAVENOUS | Status: AC
  Filled 2018-06-19: qty 500

## 2018-06-19 MED ORDER — LABETALOL HCL 5 MG/ML IV SOLN: 80.0000 mg | INTRAVENOUS | Status: DC | PRN

## 2018-06-19 MED ORDER — OXYCODONE HCL 5 MG PO TABS: 5.0000 mg | ORAL_TABLET | ORAL | Status: DC | PRN

## 2018-06-19 NOTE — Progress Notes (Signed)
Dr. Gilman Schmidt here to assess pt

## 2018-06-19 NOTE — ED Triage Notes (Signed)
Patient reports giving birth here 06/12/2018 and now c/o pain at incision, diarrhea, sob, and temp.

## 2018-06-19 NOTE — ED Notes (Signed)
Pt taken to CT via stretcher.

## 2018-06-19 NOTE — ED Notes (Signed)
Pt ambulatory to toilet

## 2018-06-19 NOTE — ED Notes (Addendum)
Pt had c section a week ago. First pregnancy. [facility] for [facility]. Is bottle feeding. Had high BP during pregnancy and since. Also c/o pain at incision site. Incision site appears well healing. A&O, speaking in complete sentences. No resp distress noted. Playing on cell phone.   Informed of need for urine sample. Did not have to urinate at this time.

## 2018-06-19 NOTE — ED Notes (Signed)
Pt ambulatory to toilet to urinate. Given urine sample cup. States pain when walking at incision site.

## 2018-06-19 NOTE — ED Notes (Signed)
X-ray at bedside

## 2018-06-19 NOTE — ED Notes (Signed)
Attempted to call report. Gave name and number to charge RN on [facility], awaiting return call.

## 2018-06-19 NOTE — ED Notes (Signed)
Pt returned from CT. Ambulatory to toilet.

## 2018-06-19 NOTE — ED Notes (Addendum)
ED TO INPATIENT HANDOFF REPORT  ED Nurse Name and Phone #:  Anda Kraft [phone]  S Name/Age/Gender Clearence Tamara Flynn 23 y.o. female Room/Bed: ED31A/ED31A  Code Status   Code Status: Full Code  Home/SNF/Other Home Patient oriented to: self, place, time and situation Is this baseline? Yes   Triage Complete: Triage complete  Chief Complaint post partum symptoms  Triage Note Patient reports giving birth here 06/12/2018 and now c/o pain at incision, diarrhea, sob, and temp.    Allergies Allergies  Allergen Reactions  . Ciprofloxacin Hcl Hives    Level of Care/Admitting Diagnosis ED Disposition    ED Disposition Condition [facility]: [place] [[id number]]  Level of Care: Antepartum [20]  Diagnosis: Preeclampsia in postpartum period [[id number]]  Admitting Physician: Homero Fellers [[id number]]  Attending Physician: Homero Fellers [[id number]]  Estimated length of stay: past midnight tomorrow  Certification:: I certify this patient will need inpatient services for at least 2 midnights  PT Class (Do Not Modify): Inpatient [101]  PT Acc Code (Do Not Modify): Private [1]       B Medical/Surgery History Past Medical History:  Diagnosis Date  . Abdominal pain during pregnancy in second trimester 03/13/2018  . Asthma    Past Surgical History:  Procedure Laterality Date  . CESAREAN SECTION N/A 06/12/2018   Procedure: CESAREAN SECTION;  Surgeon: Will Bonnet, MD;  Location: [facility] ORS;  Service: Obstetrics;  Laterality: N/A;  . WISDOM TOOTH EXTRACTION       A IV Location/Drains/Wounds Patient Lines/Drains/Airways Status   Active Line/Drains/Airways    Name:   Placement date:   Placement time:   Site:   Days:   Peripheral IV 06/19/18 Right;Anterior Forearm   06/19/18    1544    Forearm   less than 1   Incision (Closed) 06/12/18 Abdomen   06/12/18    1425     7          Intake/Output Last 24 hours No intake or output data in  the 24 hours ending 06/19/18 1622  Labs/Imaging Results for orders placed or performed during the hospital encounter of 06/19/18 (from the past 48 hour(s))  Protein / creatinine ratio, urine     Status: Abnormal   Collection Time: 06/19/18 11:56 AM  Result Value Ref Range   Creatinine, Urine 76 mg/dL   Total Protein, Urine 17 mg/dL    Comment: NO NORMAL RANGE ESTABLISHED FOR THIS TEST   Protein Creatinine Ratio 0.22 (H) 0.00 - 0.15 mg/mg[Cre]    Comment: Performed at [facility], [address] [place], [place] [address]  Basic metabolic panel     Status: Abnormal   Collection Time: 06/19/18 11:58 AM  Result Value Ref Range   Sodium 138 135 - 145 mmol/L   Potassium 4.2 3.5 - 5.1 mmol/L   Chloride 108 98 - 111 mmol/L   CO2 21 (L) 22 - 32 mmol/L   Glucose, Bld 87 70 - 99 mg/dL   BUN 15 6 - 20 mg/dL   Creatinine, Ser 0.96 0.44 - 1.00 mg/dL   Calcium 8.8 (L) 8.9 - 10.3 mg/dL   GFR calc non Af Amer >60 >60 mL/min   GFR calc Af Amer >60 >60 mL/min   Anion gap 9 5 - 15    Comment: Performed at [facility], [address] [place], [place] [address]  CBC with Differential     Status: Abnormal   Collection Time:  06/19/18 11:58 AM  Result Value Ref Range   WBC 11.3 (H) 4.0 - 10.5 K/uL   RBC 4.03 3.87 - 5.11 MIL/uL   Hemoglobin 11.4 (L) 12.0 - 15.0 g/dL   HCT 35.4 (L) 36.0 - 46.0 %   MCV 87.8 80.0 - 100.0 fL   MCH 28.3 26.0 - 34.0 pg   MCHC 32.2 30.0 - 36.0 g/dL   RDW 14.6 11.5 - 15.5 %   Platelets 519 (H) 150 - 400 K/uL   nRBC 0.0 0.0 - 0.2 %   Neutrophils Relative % 76 %   Neutro Abs 8.6 (H) 1.7 - 7.7 K/uL   Lymphocytes Relative 16 %   Lymphs Abs 1.8 0.7 - 4.0 K/uL   Monocytes Relative 4 %   Monocytes Absolute 0.5 0.1 - 1.0 K/uL   Eosinophils Relative 2 %   Eosinophils Absolute 0.2 0.0 - 0.5 K/uL   Basophils Relative 0 %   Basophils Absolute 0.0 0.0 - 0.1 K/uL   Immature Granulocytes 2 %   Abs Immature Granulocytes 0.18 (H) 0.00 - 0.07 K/uL     Comment: Performed at [facility], [address] [place], [place] [address]  Hepatic function panel     Status: Abnormal   Collection Time: 06/19/18 11:58 AM  Result Value Ref Range   Total Protein 7.7 6.5 - 8.1 g/dL   Albumin 3.3 (L) 3.5 - 5.0 g/dL   AST 50 (H) 15 - 41 U/L   ALT 64 (H) 0 - 44 U/L   Alkaline Phosphatase 228 (H) 38 - 126 U/L   Total Bilirubin 0.6 0.3 - 1.2 mg/dL   Bilirubin, Direct 0.1 0.0 - 0.2 mg/dL   Indirect Bilirubin 0.5 0.3 - 0.9 mg/dL    Comment: Performed at [facility], [address] [place], [place] [address]  Protime-INR     Status: None   Collection Time: 06/19/18 11:58 AM  Result Value Ref Range   Prothrombin Time 13.6 11.4 - 15.2 seconds   INR 1.1 0.8 - 1.2    Comment: (NOTE) INR goal varies based on device and disease states. Performed at [facility], [address] [place], [place] [address]   Urinalysis, Complete w Microscopic     Status: Abnormal   Collection Time: 06/19/18 11:58 AM  Result Value Ref Range   Color, Urine YELLOW (A) YELLOW   APPearance HAZY (A) CLEAR   Specific Gravity, Urine 1.013 1.005 - 1.030   pH 6.0 5.0 - 8.0   Glucose, UA NEGATIVE NEGATIVE mg/dL   Hgb urine dipstick LARGE (A) NEGATIVE   Bilirubin Urine NEGATIVE NEGATIVE   Ketones, ur NEGATIVE NEGATIVE mg/dL   Protein, ur NEGATIVE NEGATIVE mg/dL   Nitrite NEGATIVE NEGATIVE   Leukocytes,Ua MODERATE (A) NEGATIVE   RBC / HPF 11-20 0 - 5 RBC/hpf   WBC, UA 6-10 0 - 5 WBC/hpf   Bacteria, UA RARE (A) NONE SEEN   Squamous Epithelial / LPF 0-5 0 - 5   Mucus PRESENT     Comment: Performed at [facility], [address], [place], [place] [address]  Magnesium     Status: Abnormal   Collection Time: 06/19/18 11:58 AM  Result Value Ref Range   Magnesium 1.6 (L) 1.7 - 2.4 mg/dL    Comment: Performed at [facility], [place], [place] [address]   Ct Angio Chest Pe W And/or Wo Contrast  Result Date:  06/19/2018 CLINICAL DATA:  Status post C-section. Short  of breath, diarrhea, and fever. EXAM: CT ANGIOGRAPHY CHEST WITH CONTRAST TECHNIQUE: Multidetector CT imaging of the chest was performed using the standard protocol during bolus administration of intravenous contrast. Multiplanar CT image reconstructions and MIPs were obtained to evaluate the vascular anatomy. CONTRAST:  65mL OMNIPAQUE IOHEXOL 350 MG/ML SOLN COMPARISON:  None. FINDINGS: Cardiovascular: There are no filling defects in the pulmonary arterial tree to suggest acute pulmonary thromboembolism. There is no obvious evidence of aortic dissection or aneurysm. Great vessels are grossly patent. Mediastinum/Nodes: No abnormal mediastinal adenopathy. Residual thymic tissue is noted. Borderline bilateral axillary adenopathy is noted there are also prominent bilateral subpectoral lymph nodes. The largest node on the right has a short axis diameter of 10 mm. The largest on left has a short axis diameter of 9 mm. No pericardial effusion. Visualized thyroid is within normal limits. Esophagus is within normal limits. Lungs/Pleura: No pneumothorax or pleural effusion.  Clear lungs. Upper Abdomen: Subcentimeter hypodensity is present at the dome of the liver. Musculoskeletal: No vertebral compression deformity. Review of the MIP images confirms the above findings. IMPRESSION: No evidence of acute pulmonary thromboembolism Borderline bilateral axillary adenopathy. Considerations include inflammatory and neoplastic etiology such as lymphoma. Consider 3 to six-month follow-up to ensure resolution. Small hypodensity at the dome of the liver. If there is risk or history of malignancy, six-month follow-up MRI is recommended. Electronically Signed   By: Marybelle Killings M.D.   On: 06/19/2018 14:37   Dg Chest Portable 1 View  Result Date: 06/19/2018 CLINICAL DATA:  Shortness of breath and fever.  Recent delivery. EXAM: PORTABLE CHEST 1 VIEW COMPARISON:  None. FINDINGS: UPPER  limits normal heart size noted. There is no evidence of focal airspace disease, pulmonary edema, suspicious pulmonary nodule/mass, pleural effusion, or pneumothorax. No acute bony abnormalities are identified. IMPRESSION: No active disease. Electronically Signed   By: Margarette Canada M.D.   On: 06/19/2018 13:04    Pending Labs Unresulted Labs (From admission, onward)    Start     Ordered   06/26/18 0500  Creatinine, serum  (enoxaparin (LOVENOX)    CrCl >/= 30 ml/min)  Weekly,   STAT    Comments:  while on enoxaparin therapy    06/19/18 1604   06/26/18 0500  Creatinine, serum  (enoxaparin (LOVENOX)    CrCl < 30 ml/min)  Weekly,   STAT    Comments:  while on enoxaparin therapy.    06/19/18 1604   06/20/18 0500  CBC  Tomorrow morning,   STAT     06/19/18 1604   06/20/18 0500  Comprehensive metabolic panel  Tomorrow morning,   STAT     06/19/18 1604   06/19/18 1556  CBC  (enoxaparin (LOVENOX)    CrCl >/= 30 ml/min)  Once,   STAT    Comments:  Baseline for enoxaparin therapy IF NOT ALREADY DRAWN.  Notify MD if PLT < 100 K.    06/19/18 1604   06/19/18 1556  Creatinine, serum  (enoxaparin (LOVENOX)    CrCl >/= 30 ml/min)  Once,   STAT    Comments:  Baseline for enoxaparin therapy IF NOT ALREADY DRAWN.    06/19/18 1604          Vitals/Pain Today's Vitals   06/19/18 1126 06/19/18 1212 06/19/18 1442 06/19/18 1545  BP:  (!) 137/98 (!) 140/100 (!) 116/95  Pulse:  (!) 120 (!) 103   Resp:  16 16 16   Temp:   98.5 F (36.9 C)   TempSrc:  Oral   SpO2:  98% 100%   Weight: 76.7 kg     Height: 4\' 11"  (1.499 m)     PainSc:        Isolation Precautions No active isolations  Medications Medications  oxyCODONE (Oxy IR/ROXICODONE) immediate release tablet 5 mg (has no administration in time range)  prenatal multivitamin tablet 1 tablet (has no administration in time range)  fluticasone (FLOVENT HFA) 220 MCG/ACT inhaler 1 puff (has no administration in time range)  albuterol (PROVENTIL  HFA;VENTOLIN HFA) 108 (90 Base) MCG/ACT inhaler 2 puff (has no administration in time range)  lactated ringers infusion (has no administration in time range)  docusate sodium (COLACE) capsule 100 mg (has no administration in time range)  ondansetron (ZOFRAN) tablet 4 mg (has no administration in time range)    Or  ondansetron (ZOFRAN) injection 4 mg (has no administration in time range)  enoxaparin (LOVENOX) injection 40 mg (has no administration in time range)  labetalol (NORMODYNE,TRANDATE) injection 20 mg (has no administration in time range)    And  labetalol (NORMODYNE,TRANDATE) injection 40 mg (has no administration in time range)    And  labetalol (NORMODYNE,TRANDATE) injection 80 mg (has no administration in time range)    And  hydrALAZINE (APRESOLINE) injection 10 mg (has no administration in time range)  labetalol (NORMODYNE,TRANDATE) injection 20 mg (has no administration in time range)    And  labetalol (NORMODYNE,TRANDATE) injection 40 mg (has no administration in time range)    And  labetalol (NORMODYNE,TRANDATE) injection 80 mg (has no administration in time range)    And  hydrALAZINE (APRESOLINE) injection 10 mg (has no administration in time range)  magnesium bolus via infusion 4 g (has no administration in time range)  magnesium sulfate 40 grams in LR 500 mL OB infusion (has no administration in time range)  sodium chloride 0.9 % bolus 1,000 mL (0 mLs Intravenous Stopped 06/19/18 1441)  iohexol (OMNIPAQUE) 350 MG/ML injection 75 mL (75 mLs Intravenous Contrast Given 06/19/18 1414)    Mobility walks Low fall risk   Focused Assessments    R Recommendations: See Admitting Provider Note  Report given to: Tillie Rung, RN [facility]  Additional Notes:

## 2018-06-19 NOTE — ED Notes (Signed)
This tech transferred pt to room 348

## 2018-06-19 NOTE — ED Notes (Signed)
Pt provided with juice.

## 2018-06-19 NOTE — H&P (Signed)
H&P  Tamara Flynn is an 23 y.o. female.   HPI: She presented today to the ER for multiple complaints. She reported incisional pain which was worsening as well as shortness of breath and chest tightness. She has been having shooting pains in her breast.   She reports that since her cesarean delivery she has mostly been at home in bed. A caretaker has been helping a lot with her son. She has been taking Roxicodone, but only has a few tablets left. She has not been having adequate pain control. When she takes the Roxicodone she also takes tylenol . She has not been taking Motrin.   She says that she has been having shortness of breath and feels pressure on her chest. She has a daily inhaler, but she last took in Friday. She has not used her rescue inhaler because she has not felt like she is having an asthma attack.  She reports a headache that has come and gone. It was worse over the weekend. It might be starting again.  She denies vision changes. She has had mild RUQ pain.   She has shooting pain in her breasts. Her breasts are engorged. She can feel lumps in them and has been trying to massage the lumps out. She is not breast feeding. She has been wearing a bra at home, but did not wear one to the hospital today. She has on a tank top with wash clothes tucked in because she is leaking milk. She has been wearing cabbage leaves in her bra at home.    Past Medical History:  Diagnosis Date  . Abdominal pain during pregnancy in second trimester 03/13/2018  . Asthma     Past Surgical History:  Procedure Laterality Date  . CESAREAN SECTION N/A 06/12/2018   Procedure: CESAREAN SECTION;  Surgeon: Will Bonnet, MD;  Location: [facility] ORS;  Service: Obstetrics;  Laterality: N/A;  . WISDOM TOOTH EXTRACTION      Family History  Problem Relation Age of Onset  . Epilepsy Mother   . Epilepsy Father     Social History:  reports that she has never smoked. She has never used smokeless tobacco. She  reports that she does not drink alcohol or use drugs.  Allergies:  Allergies  Allergen Reactions  . Ciprofloxacin Hcl Hives    Medications: I have reviewed the patient's current medications.  Results for orders placed or performed during the hospital encounter of 06/19/18 (from the past 48 hour(s))  Protein / creatinine ratio, urine     Status: Abnormal   Collection Time: 06/19/18 11:56 AM  Result Value Ref Range   Creatinine, Urine 76 mg/dL   Total Protein, Urine 17 mg/dL    Comment: NO NORMAL RANGE ESTABLISHED FOR THIS TEST   Protein Creatinine Ratio 0.22 (H) 0.00 - 0.15 mg/mg[Cre]    Comment: Performed at [facility], [address] [place], [place] [address]  Basic metabolic panel     Status: Abnormal   Collection Time: 06/19/18 11:58 AM  Result Value Ref Range   Sodium 138 135 - 145 mmol/L   Potassium 4.2 3.5 - 5.1 mmol/L   Chloride 108 98 - 111 mmol/L   CO2 21 (L) 22 - 32 mmol/L   Glucose, Bld 87 70 - 99 mg/dL   BUN 15 6 - 20 mg/dL   Creatinine, Ser 0.96 0.44 - 1.00 mg/dL   Calcium 8.8 (L) 8.9 - 10.3 mg/dL   GFR calc non Af Amer >60 >60  mL/min   GFR calc Af Amer >60 >60 mL/min   Anion gap 9 5 - 15    Comment: Performed at [facility], [address] [place], [place] [address]  CBC with Differential     Status: Abnormal   Collection Time: 06/19/18 11:58 AM  Result Value Ref Range   WBC 11.3 (H) 4.0 - 10.5 K/uL   RBC 4.03 3.87 - 5.11 MIL/uL   Hemoglobin 11.4 (L) 12.0 - 15.0 g/dL   HCT 35.4 (L) 36.0 - 46.0 %   MCV 87.8 80.0 - 100.0 fL   MCH 28.3 26.0 - 34.0 pg   MCHC 32.2 30.0 - 36.0 g/dL   RDW 14.6 11.5 - 15.5 %   Platelets 519 (H) 150 - 400 K/uL   nRBC 0.0 0.0 - 0.2 %   Neutrophils Relative % 76 %   Neutro Abs 8.6 (H) 1.7 - 7.7 K/uL   Lymphocytes Relative 16 %   Lymphs Abs 1.8 0.7 - 4.0 K/uL   Monocytes Relative 4 %   Monocytes Absolute 0.5 0.1 - 1.0 K/uL   Eosinophils Relative 2 %   Eosinophils Absolute 0.2 0.0 - 0.5 K/uL    Basophils Relative 0 %   Basophils Absolute 0.0 0.0 - 0.1 K/uL   Immature Granulocytes 2 %   Abs Immature Granulocytes 0.18 (H) 0.00 - 0.07 K/uL    Comment: Performed at [facility], [address] [place], [place] [address]  Hepatic function panel     Status: Abnormal   Collection Time: 06/19/18 11:58 AM  Result Value Ref Range   Total Protein 7.7 6.5 - 8.1 g/dL   Albumin 3.3 (L) 3.5 - 5.0 g/dL   AST 50 (H) 15 - 41 U/L   ALT 64 (H) 0 - 44 U/L   Alkaline Phosphatase 228 (H) 38 - 126 U/L   Total Bilirubin 0.6 0.3 - 1.2 mg/dL   Bilirubin, Direct 0.1 0.0 - 0.2 mg/dL   Indirect Bilirubin 0.5 0.3 - 0.9 mg/dL    Comment: Performed at [facility], [address] [place], [place] [address]  Protime-INR     Status: None   Collection Time: 06/19/18 11:58 AM  Result Value Ref Range   Prothrombin Time 13.6 11.4 - 15.2 seconds   INR 1.1 0.8 - 1.2    Comment: (NOTE) INR goal varies based on device and disease states. Performed at [facility], [address] [place], [place] [address]   Urinalysis, Complete w Microscopic     Status: Abnormal   Collection Time: 06/19/18 11:58 AM  Result Value Ref Range   Color, Urine YELLOW (A) YELLOW   APPearance HAZY (A) CLEAR   Specific Gravity, Urine 1.013 1.005 - 1.030   pH 6.0 5.0 - 8.0   Glucose, UA NEGATIVE NEGATIVE mg/dL   Hgb urine dipstick LARGE (A) NEGATIVE   Bilirubin Urine NEGATIVE NEGATIVE   Ketones, ur NEGATIVE NEGATIVE mg/dL   Protein, ur NEGATIVE NEGATIVE mg/dL   Nitrite NEGATIVE NEGATIVE   Leukocytes,Ua MODERATE (A) NEGATIVE   RBC / HPF 11-20 0 - 5 RBC/hpf   WBC, UA 6-10 0 - 5 WBC/hpf   Bacteria, UA RARE (A) NONE SEEN   Squamous Epithelial / LPF 0-5 0 - 5   Mucus PRESENT     Comment: Performed at [facility], [address] [place], [place] [address]  Magnesium     Status: Abnormal   Collection Time: 06/19/18 11:58 AM  Result Value Ref  Range   Magnesium 1.6 (L) 1.7 - 2.4 mg/dL     Comment: Performed at [facility], [place], [place] [address]    Ct Angio Chest Pe W And/or Wo Contrast  Result Date: 06/19/2018 CLINICAL DATA:  Status post C-section. Short of breath, diarrhea, and fever. EXAM: CT ANGIOGRAPHY CHEST WITH CONTRAST TECHNIQUE: Multidetector CT imaging of the chest was performed using the standard protocol during bolus administration of intravenous contrast. Multiplanar CT image reconstructions and MIPs were obtained to evaluate the vascular anatomy. CONTRAST:  29mL OMNIPAQUE IOHEXOL 350 MG/ML SOLN COMPARISON:  None. FINDINGS: Cardiovascular: There are no filling defects in the pulmonary arterial tree to suggest acute pulmonary thromboembolism. There is no obvious evidence of aortic dissection or aneurysm. Great vessels are grossly patent. Mediastinum/Nodes: No abnormal mediastinal adenopathy. Residual thymic tissue is noted. Borderline bilateral axillary adenopathy is noted there are also prominent bilateral subpectoral lymph nodes. The largest node on the right has a short axis diameter of 10 mm. The largest on left has a short axis diameter of 9 mm. No pericardial effusion. Visualized thyroid is within normal limits. Esophagus is within normal limits. Lungs/Pleura: No pneumothorax or pleural effusion.  Clear lungs. Upper Abdomen: Subcentimeter hypodensity is present at the dome of the liver. Musculoskeletal: No vertebral compression deformity. Review of the MIP images confirms the above findings. IMPRESSION: No evidence of acute pulmonary thromboembolism Borderline bilateral axillary adenopathy. Considerations include inflammatory and neoplastic etiology such as lymphoma. Consider 3 to six-month follow-up to ensure resolution. Small hypodensity at the dome of the liver. If there is risk or history of malignancy, six-month follow-up MRI is recommended. Electronically Signed   By: Marybelle Killings M.D.   On: 06/19/2018 14:37   Dg Chest Portable 1  View  Result Date: 06/19/2018 CLINICAL DATA:  Shortness of breath and fever.  Recent delivery. EXAM: PORTABLE CHEST 1 VIEW COMPARISON:  None. FINDINGS: UPPER limits normal heart size noted. There is no evidence of focal airspace disease, pulmonary edema, suspicious pulmonary nodule/mass, pleural effusion, or pneumothorax. No acute bony abnormalities are identified. IMPRESSION: No active disease. Electronically Signed   By: Margarette Canada M.D.   On: 06/19/2018 13:04    Review of Systems  Constitutional: Positive for malaise/fatigue. Negative for chills, fever and weight loss.  HENT: Negative for congestion, hearing loss and sinus pain.   Eyes: Negative for blurred vision and double vision.  Respiratory: Positive for shortness of breath. Negative for cough, sputum production and wheezing.   Cardiovascular: Positive for chest pain. Negative for palpitations, orthopnea and leg swelling.  Gastrointestinal: Negative for abdominal pain, constipation, diarrhea, nausea and vomiting.  Genitourinary: Negative for dysuria, flank pain, frequency, hematuria and urgency.  Musculoskeletal: Negative for back pain, falls and joint pain.  Skin: Negative for itching and rash.  Neurological: Positive for headaches. Negative for dizziness.  Psychiatric/Behavioral: Negative for depression, substance abuse and suicidal ideas. The patient is not nervous/anxious.    Blood pressure 131/89, pulse 99, temperature 98.7 F (37.1 C), temperature source Oral, resp. rate 18, height 4\' 11"  (1.499 m), weight 76.7 kg, last menstrual period 09/24/2017, SpO2 100 %, not currently breastfeeding. Physical Exam  Nursing note and vitals reviewed. Constitutional: She is oriented to person, place, and time. She appears well-developed and well-nourished.  HENT:  Head: Normocephalic and atraumatic.  Cardiovascular: Normal rate and regular rhythm.  Respiratory: Effort normal and breath sounds normal.  Breasts are equal. No skin changes.  Engorged.  No erythema or concern for  mastitis. Tender to palpation.  GI: Soft. Bowel sounds are normal.  Incision is clean, dry, and intact No induration. No leakage of fluid, no redness.   Musculoskeletal: Normal range of motion.  Neurological: She is alert and oriented to person, place, and time.  Skin: Skin is warm and dry.  Psychiatric: She has a normal mood and affect. Her behavior is normal. Judgment and thought content normal.    Assessment/Plan: 23 yo G1P1001 s/p LTCS  1. Preeclampsia with mildly elevated blood pressure. - Will start labetalol and monitor blood pressures overnight. Offered IV magnesium because patient reports the severe symptom of a headache, however she says it is manageable and comes and goes. She would like to decline magnesium at this time.  She will let us know if the headache is severe or returns and magnesium for neuro-prophylaxis can be started.  Liver enzymes were elevated which is concerning for HELLP, but platelets were normal. Will repeat in AM. Protein:Creatinine ratio has improved since last week.  2. Breast Engorgement pain- supportive care with ice packs, tight fitting bra and wrapping or the breasts with an ACE bandage.  3. DVT prophylaxis-Lovenox 4.  Regular diet 5.  Electrolyte replacement with IV magnesium (2 grams) 6. Repeat labs in AM 7. Pain control with roxicodone, tylenol and motrin.    Kamarri Lovvorn R Nesta Kimple 06/19/2018, 6:22 PM

## 2018-06-19 NOTE — ED Provider Notes (Signed)
[facility] Emergency Department Provider Note ____________________________________________   First MD Initiated Contact with Patient 06/19/18 1144     (approximate)  I have reviewed the triage vital signs and the nursing notes.   HISTORY  Chief Complaint Post-op Problem and Postpartum Complications    HPI Tamara Flynn is a 23 y.o. female with PMH as noted below and status post cesarean section delivery on 06/12/2018 who presents lower abdominal pain near her incision site, mainly on the left side, and gradually worsening over the last several days.  In addition the patient reports shortness of breath which started after the C-section and has persisted.  It is not associated with cough.  The patient does report feeling an elevated temperature at home.  In addition, the patient reports a persistent frontal headache since she left the hospital.   Past Medical History:  Diagnosis Date  . Abdominal pain during pregnancy in second trimester 03/13/2018  . Asthma     Patient Active Problem List   Diagnosis Date Noted  . Preeclampsia in postpartum period 06/19/2018  . Hypertension in pregnancy, preeclampsia, severe, delivered/postpartum 06/13/2018  . Delivery of pregnancy by cesarean section 06/13/2018  . Failed induction of labor 06/13/2018  . Fetal intolerance to labor, delivered, current hospitalization 06/13/2018  . Braxton Hick's contraction 06/09/2018  . Nausea and vomiting during pregnancy 06/09/2018  . Uterine contractions during pregnancy 06/09/2018  . Abdominal pain during pregnancy in third trimester 06/07/2018  . Gestational hypertension 06/07/2018  . Polyhydramnios affecting pregnancy 04/28/2018  . Supervision of high risk pregnancy, antepartum 11/18/2017    Past Surgical History:  Procedure Laterality Date  . CESAREAN SECTION N/A 06/12/2018   Procedure: CESAREAN SECTION;  Surgeon: Will Bonnet, MD;  Location: [facility] ORS;  Service:  Obstetrics;  Laterality: N/A;  . WISDOM TOOTH EXTRACTION      Prior to Admission medications   Medication Sig Start Date End Date Taking? Authorizing Provider  FLOVENT HFA 220 MCG/ACT inhaler Inhale 1 puff into the lungs 2 (two) times daily.  11/10/17  Yes [provider]  oxyCODONE (OXY IR/ROXICODONE) 5 MG immediate release tablet Take 1 tablet (5 mg total) by mouth every 6 (six) hours as needed for up to 5 days for moderate pain or severe pain. 06/14/18 06/19/18 Yes Rod Can, CNM  Prenatal Vit-Fe Fumarate-FA (PRENATAL MULTIVITAMIN) TABS tablet Take 1 tablet by mouth daily at 12 noon. GUMMY    [provider]  PROAIR HFA 108 (90 Base) MCG/ACT inhaler Inhale 2 puffs into the lungs every 4 (four) hours as needed.  11/10/17   [provider]    Allergies Ciprofloxacin hcl  Family History  Problem Relation Age of Onset  . Epilepsy Mother   . Epilepsy Father     Social History Social History   Tobacco Use  . Smoking status: Never Smoker  . Smokeless tobacco: Never Used  Substance Use Topics  . Alcohol use: No  . Drug use: No    Review of Systems  Constitutional: Positive for subjective fever. Eyes: No visual changes. ENT: No neck pain. Cardiovascular: Denies chest pain. Respiratory: Positive for shortness of breath. Gastrointestinal: No vomiting.  Positive for diarrhea.  Genitourinary: Negative for dysuria.  Musculoskeletal: Negative for back pain. Skin: Negative for rash. Neurological: Positive for headache.   ____________________________________________   PHYSICAL EXAM:  VITAL SIGNS: ED Triage Vitals  Enc Vitals Group     BP 06/19/18 1125 (!) 147/104     Pulse Rate 06/19/18  1125 (!) 119     Resp 06/19/18 1125 16     Temp 06/19/18 1125 98.4 F (36.9 C)     Temp Source 06/19/18 1125 Oral     SpO2 06/19/18 1125 100 %     Weight 06/19/18 1126 169 lb (76.7 kg)     Height 06/19/18 1126 4\' 11"  (1.499 m)     Head Circumference --       Peak Flow --      Pain Score 06/19/18 1125 6     Pain Loc --      Pain Edu? --      Excl. in [place]? --     Constitutional: Alert and oriented.  Relatively well appearing and in no acute distress. Eyes: Conjunctivae are normal.  Head: Atraumatic. Nose: No congestion/rhinnorhea. Mouth/Throat: Mucous membranes are moist.   Neck: Normal range of motion.  Cardiovascular: Normal rate, regular rhythm. Grossly normal heart sounds.  Good peripheral circulation. Respiratory: Normal respiratory effort.  No retractions. Lungs CTAB. Gastrointestinal: Soft with moderate left lower quadrant/inguinal area tenderness at and lateral to the incision site.  Cesarean incision site clean/dry/intact.  No distention.  Genitourinary: No flank tenderness. Musculoskeletal: Extremities warm and well perfused.  Neurologic:  Normal speech and language. No gross focal neurologic deficits are appreciated.  Skin:  Skin is warm and dry. No rash noted. Psychiatric: Mood and affect are normal. Speech and behavior are normal.  ____________________________________________   LABS (all labs ordered are listed, but only abnormal results are displayed)  Labs Reviewed  BASIC METABOLIC PANEL - Abnormal; Notable for the following components:      Result Value   CO2 21 (*)    Calcium 8.8 (*)    All other components within normal limits  CBC WITH DIFFERENTIAL/PLATELET - Abnormal; Notable for the following components:   WBC 11.3 (*)    Hemoglobin 11.4 (*)    HCT 35.4 (*)    Platelets 519 (*)    Neutro Abs 8.6 (*)    Abs Immature Granulocytes 0.18 (*)    All other components within normal limits  HEPATIC FUNCTION PANEL - Abnormal; Notable for the following components:   Albumin 3.3 (*)    AST 50 (*)    ALT 64 (*)    Alkaline Phosphatase 228 (*)    All other components within normal limits  URINALYSIS, COMPLETE (UACMP) WITH MICROSCOPIC - Abnormal; Notable for the following components:   Color, Urine YELLOW (*)     APPearance HAZY (*)    Hgb urine dipstick LARGE (*)    Leukocytes,Ua MODERATE (*)    Bacteria, UA RARE (*)    All other components within normal limits  MAGNESIUM - Abnormal; Notable for the following components:   Magnesium 1.6 (*)    All other components within normal limits  PROTEIN / CREATININE RATIO, URINE - Abnormal; Notable for the following components:   Protein Creatinine Ratio 0.22 (*)    All other components within normal limits  PROTIME-INR  CBC  COMPREHENSIVE METABOLIC PANEL   ____________________________________________  EKG  ED ECG REPORT I, Arta Silence, the attending physician, personally viewed and interpreted this ECG.  Date: 06/19/2018 EKG Time: 1131 Rate: 118 Rhythm: Sinus tachycardia QRS Axis: normal Intervals: normal ST/T Wave abnormalities: normal Narrative Interpretation: no evidence of acute ischemia  ____________________________________________  RADIOLOGY    ____________________________________________   PROCEDURES  Procedure(s) performed: No  Procedures  Critical Care performed: Yes  CRITICAL CARE Performed by: Arta Silence   Total  critical care time: 30 minutes  Critical care time was exclusive of separately billable procedures and treating other patients.  Critical care was necessary to treat or prevent imminent or life-threatening deterioration.  Critical care was time spent personally by me on the following activities: development of treatment plan with patient and/or surrogate as well as nursing, discussions with consultants, evaluation of patient's response to treatment, examination of patient, obtaining history from patient or surrogate, ordering and performing treatments and interventions, ordering and review of laboratory studies, ordering and review of radiographic studies, pulse oximetry and re-evaluation of patient's condition. ____________________________________________   INITIAL IMPRESSION /  ASSESSMENT AND PLAN / ED COURSE  Pertinent labs & imaging results that were available during my care of the patient were reviewed by me and considered in my medical decision making (see chart for details).  23 year old female with PMH as noted below and status post delivery by cesarean section on 06/12/2018 presents with lower abdominal pain adjacent to her incision site as well as shortness of breath and headache.  I reviewed the past medical records in epic; the patient's postpartum course was complicated by a diagnosis of preeclampsia and she was treated with magnesium.  She had chorioamnionitis and was treated with antibiotics.  On exam today the patient is tachycardic and somewhat hypertensive.  Her other vital signs are normal.  She is relatively well-appearing overall.  Lungs are clear to auscultation.  The abdomen is soft although there is tenderness in the left lower abdomen at and adjacent to the incision site.  The incision itself appears intact with no evidence of infection or dehiscence.  1.  Abdominal pain: Cesarean incision appears intact but the patient does have significant tenderness.  Differential includes endometritis or other infectious cause.  We will obtain labs and I will consult OB/GYN to evaluate.  2.  Headache/hypertension: Presentation is concerning for preeclampsia.  We will continue to monitor the blood pressure, check preeclampsia labs and magnesium, and reassess.  I will discuss with OB/GYN.  3.  Shortness of breath: The patient has reported shortness of breath since the time of her delivery.  Her lungs are clear on exam.  Differential includes bronchitis, pneumonia, or edema.  I have a lower suspicion for pulmonary embolism given the lack of chest pain or hypoxia, but the patient is tachycardic.  If chest x-ray is nondiagnostic I will consider other imaging.  ----------------------------------------- 8:09 PM on 06/19/2018 -----------------------------------------   Chest X-ray showed no focal infiltrate, so I proceeded with CT chest which was negative for PE.  Lab work-up revealed elevated LFTs somewhat concerning for preeclampsia, and a low magnesium.  I consulted Dr. Gilman Schmidt from [facility] and discussed the results of the work-up here.  She agreed to evaluate the patient and recommended admission to the [facility] service for further management of possible preeclampsia.  The patient agreed with this plan.  She remained stable during her ED stay with improvement in her blood pressure.  ____________________________________________   FINAL CLINICAL IMPRESSION(S) / ED DIAGNOSES  Final diagnoses:  Pre-eclampsia, antepartum      NEW MEDICATIONS STARTED DURING THIS VISIT:  Current Discharge Medication List       Note:  This document was prepared using Dragon voice recognition software and may include unintentional dictation errors.    Arta Silence, MD 06/19/18 2010

## 2018-06-20 LAB — HEPATIC FUNCTION PANEL
ALT: 53 U/L — ABNORMAL HIGH (ref 0–44)
AST: 45 U/L — ABNORMAL HIGH (ref 15–41)
Albumin: 3.2 g/dL — ABNORMAL LOW (ref 3.5–5.0)
Alkaline Phosphatase: 189 U/L — ABNORMAL HIGH (ref 38–126)
Bilirubin, Direct: <0.1 mg/dL (ref 0.0–0.2)
Total Bilirubin: <0.1 mg/dL — ABNORMAL LOW (ref 0.3–1.2)
Total Protein: 6.9 g/dL (ref 6.5–8.1)

## 2018-06-20 LAB — COMPREHENSIVE METABOLIC PANEL
ALT: 55 U/L — ABNORMAL HIGH (ref 0–44)
AST: 44 U/L — ABNORMAL HIGH (ref 15–41)
Albumin: 3 g/dL — ABNORMAL LOW (ref 3.5–5.0)
Alkaline Phosphatase: 199 U/L — ABNORMAL HIGH (ref 38–126)
Anion gap: 10 (ref 5–15)
BUN: 18 mg/dL (ref 6–20)
CO2: 21 mmol/L — ABNORMAL LOW (ref 22–32)
Calcium: 8.7 mg/dL — ABNORMAL LOW (ref 8.9–10.3)
Chloride: 105 mmol/L (ref 98–111)
Creatinine, Ser: 1.12 mg/dL — ABNORMAL HIGH (ref 0.44–1.00)
GFR calc Af Amer: >60 mL/min (ref >60)
GFR calc non Af Amer: >60 mL/min (ref >60)
Glucose, Bld: 85 mg/dL (ref 70–99)
Potassium: 4.4 mmol/L (ref 3.5–5.1)
Sodium: 136 mmol/L (ref 135–145)
Total Bilirubin: 0.4 mg/dL (ref 0.3–1.2)
Total Protein: 6.9 g/dL (ref 6.5–8.1)

## 2018-06-20 LAB — CBC
HCT: 32 % — ABNORMAL LOW (ref 36.0–46.0)
Hemoglobin: 10.2 g/dL — ABNORMAL LOW (ref 12.0–15.0)
MCH: 27.9 pg (ref 26.0–34.0)
MCHC: 31.9 g/dL (ref 30.0–36.0)
MCV: 87.4 fL (ref 80.0–100.0)
Platelets: 509 10*3/uL — ABNORMAL HIGH (ref 150–400)
RBC: 3.66 MIL/uL — ABNORMAL LOW (ref 3.87–5.11)
RDW: 14.8 % (ref 11.5–15.5)
WBC: 10.6 10*3/uL — ABNORMAL HIGH (ref 4.0–10.5)
nRBC: 0 % (ref 0.0–0.2)

## 2018-06-20 LAB — CREATININE, SERUM
Creatinine, Ser: 0.84 mg/dL (ref 0.44–1.00)
GFR calc Af Amer: >60 mL/min (ref >60)
GFR calc non Af Amer: >60 mL/min (ref >60)

## 2018-06-20 MED ORDER — BUDESONIDE 0.5 MG/2ML IN SUSP
0.5000 mg | Freq: Every day | RESPIRATORY_TRACT | Status: DC
  Filled 2018-06-20: qty 2

## 2018-06-20 MED ORDER — ALBUTEROL SULFATE (2.5 MG/3ML) 0.083% IN NEBU: 2.5000 mg | INHALATION_SOLUTION | Freq: Four times a day (QID) | RESPIRATORY_TRACT | Status: DC | PRN

## 2018-06-20 MED ORDER — LACTATED RINGERS IV BOLUS
300.0000 mL | Freq: Once | INTRAVENOUS | Status: AC
  Administered 2018-06-20: 300 mL via INTRAVENOUS

## 2018-06-20 NOTE — Progress Notes (Signed)
Pt resting in bed, reports that she does not want to take colace as she has loose stool. Documented in [facility]. Pt given crackers at this time. Pt also requests that she not be woken for tylenol as she is not having pain at this time.

## 2018-06-20 NOTE — Progress Notes (Signed)
DAte of Delivery 06/12/2018 LTCS for failed induction/ FITL complicated by chorioamnionitis and preeclampsia with severe features. Date of Readmission: 06/19/2018 Today's date: 06/20/2018 Subjective:  Feeling better today. Not SOB. Breasts not as tender. No headache, nausea/vomiting/ CP Has been getting Albuterol nebulizer treatments every 6 hours. Some incisional pain. Had some transient epigastric pain this AM. Last BM was yesterday.   Objective:  Blood pressure 123/81, pulse 79, temperature 98.3 F (36.8 C), temperature source Oral, resp. rate 20, height 4\' 11"  (1.499 m), weight 76.7 kg, last menstrual period 09/24/2017, SpO2 100 %, not currently breastfeeding. Patient Vitals for the past 24 hrs:  BP Temp Temp src Pulse Resp SpO2 Height Weight  06/20/18 0741 123/81 98.3 F (36.8 C) Oral 79 20 100 % - -  06/20/18 0434 (!) 124/96 97.7 F (36.5 C) Oral 78 18 100 % - -  06/20/18 0032 111/74 98 F (36.7 C) Oral 87 18 99 % - -  06/19/18 2116 125/83 98.5 F (36.9 C) Oral (!) 104 18 100 % - -  06/19/18 2034 126/77 - - (!) 108 - - - -  06/19/18 2004 125/86 - - (!) 107 - - - -  06/19/18 1949 128/87 - - (!) 104 - - - -  06/19/18 1904 129/81 - - 100 - - - -  06/19/18 1804 131/89 - - 99 - - - -  06/19/18 1755 - - - - - 100 % - -  06/19/18 1750 - - - - - 99 % - -  06/19/18 1749 (!) 141/84 - - 100 - - - -  06/19/18 1745 - - - - - 97 % - -  06/19/18 1740 - - - - - 99 % - -  06/19/18 1734 (!) 132/100 98.7 F (37.1 C) Oral 98 18 99 % - -  06/19/18 1545 (!) 116/95 - - - 16 - - -  06/19/18 1442 (!) 140/100 98.5 F (36.9 C) Oral (!) 103 16 100 % - -  06/19/18 1212 (!) 137/98 - - (!) 120 16 98 % - -  06/19/18 1126 - - - - - - 4\' 11"  (1.499 m) 76.7 kg  06/19/18 1125 (!) 147/104 98.4 F (36.9 C) Oral (!) 119 16 100 % - -   General: BF in NAD HEENT: conjunctiva pink Heart: RRR without murmur Lungs: CTAB (transient wheezes in left lung)/ normal respiratory effort  Abdomen: soft but tympanic in  upper abdomen, mild tenderness in epigastric area, nontender in RUQ, , fundus firm at level of umbilicus-3FB Incision: C+D+I Extremities: no edema, no erythema, no tenderness  Results for orders placed or performed during the hospital encounter of 06/19/18 (from the past 72 hour(s))  Protein / creatinine ratio, urine     Status: Abnormal   Collection Time: 06/19/18 11:56 AM  Result Value Ref Range   Creatinine, Urine 76 mg/dL   Total Protein, Urine 17 mg/dL    Comment: NO NORMAL RANGE ESTABLISHED FOR THIS TEST   Protein Creatinine Ratio 0.22 (H) 0.00 - 0.15 mg/mg[Cre]    Comment: Performed at [facility], [address] [place], [place] [address]  Basic metabolic panel     Status: Abnormal   Collection Time: 06/19/18 11:58 AM  Result Value Ref Range   Sodium 138 135 - 145 mmol/L   Potassium 4.2 3.5 - 5.1 mmol/L   Chloride 108 98 - 111 mmol/L   CO2 21 (L) 22 - 32 mmol/L   Glucose, Bld 87 70 - 99  mg/dL   BUN 15 6 - 20 mg/dL   Creatinine, Ser 0.96 0.44 - 1.00 mg/dL   Calcium 8.8 (L) 8.9 - 10.3 mg/dL   GFR calc non Af Amer >60 >60 mL/min   GFR calc Af Amer >60 >60 mL/min   Anion gap 9 5 - 15    Comment: Performed at [facility], [address] [place], [place] [address]  CBC with Differential     Status: Abnormal   Collection Time: 06/19/18 11:58 AM  Result Value Ref Range   WBC 11.3 (H) 4.0 - 10.5 K/uL   RBC 4.03 3.87 - 5.11 MIL/uL   Hemoglobin 11.4 (L) 12.0 - 15.0 g/dL   HCT 35.4 (L) 36.0 - 46.0 %   MCV 87.8 80.0 - 100.0 fL   MCH 28.3 26.0 - 34.0 pg   MCHC 32.2 30.0 - 36.0 g/dL   RDW 14.6 11.5 - 15.5 %   Platelets 519 (H) 150 - 400 K/uL   nRBC 0.0 0.0 - 0.2 %   Neutrophils Relative % 76 %   Neutro Abs 8.6 (H) 1.7 - 7.7 K/uL   Lymphocytes Relative 16 %   Lymphs Abs 1.8 0.7 - 4.0 K/uL   Monocytes Relative 4 %   Monocytes Absolute 0.5 0.1 - 1.0 K/uL   Eosinophils Relative 2 %   Eosinophils Absolute 0.2 0.0 - 0.5 K/uL   Basophils Relative 0 %    Basophils Absolute 0.0 0.0 - 0.1 K/uL   Immature Granulocytes 2 %   Abs Immature Granulocytes 0.18 (H) 0.00 - 0.07 K/uL    Comment: Performed at [facility], [address] [place], [place] [address]  Hepatic function panel     Status: Abnormal   Collection Time: 06/19/18 11:58 AM  Result Value Ref Range   Total Protein 7.7 6.5 - 8.1 g/dL   Albumin 3.3 (L) 3.5 - 5.0 g/dL   AST 50 (H) 15 - 41 U/L   ALT 64 (H) 0 - 44 U/L   Alkaline Phosphatase 228 (H) 38 - 126 U/L   Total Bilirubin 0.6 0.3 - 1.2 mg/dL   Bilirubin, Direct 0.1 0.0 - 0.2 mg/dL   Indirect Bilirubin 0.5 0.3 - 0.9 mg/dL    Comment: Performed at [facility], [address] [place], [place] [address]  Protime-INR     Status: None   Collection Time: 06/19/18 11:58 AM  Result Value Ref Range   Prothrombin Time 13.6 11.4 - 15.2 seconds   INR 1.1 0.8 - 1.2    Comment: (NOTE) INR goal varies based on device and disease states. Performed at [facility], [address] [place],  [address]   Urinalysis, Complete w Microscopic     Status: Abnormal   Collection Time: 06/19/18 11:58 AM  Result Value Ref Range   Color, Urine YELLOW (A) YELLOW   APPearance HAZY (A) CLEAR   Specific Gravity, Urine 1.013 1.005 - 1.030   pH 6.0 5.0 - 8.0   Glucose, UA NEGATIVE NEGATIVE mg/dL   Hgb urine dipstick LARGE (A) NEGATIVE   Bilirubin Urine NEGATIVE NEGATIVE   Ketones, ur NEGATIVE NEGATIVE mg/dL   Protein, ur NEGATIVE NEGATIVE mg/dL   Nitrite NEGATIVE NEGATIVE   Leukocytes,Ua MODERATE (A) NEGATIVE   RBC / HPF 11-20 0 - 5 RBC/hpf   WBC, UA 6-10 0 - 5 WBC/hpf   Bacteria, UA RARE (A) NONE SEEN   Squamous Epithelial / LPF 0-5 0 - 5   Mucus PRESENT  Comment: Performed at [facility], [address] [place], [place] [address]  Magnesium     Status: Abnormal   Collection Time: 06/19/18 11:58 AM  Result Value Ref Range   Magnesium 1.6 (L) 1.7 - 2.4 mg/dL    Comment: Performed at  [facility], [address] [place], [place] [address]  CBC     Status: Abnormal   Collection Time: 06/20/18  4:21 AM  Result Value Ref Range   WBC 10.6 (H) 4.0 - 10.5 K/uL   RBC 3.66 (L) 3.87 - 5.11 MIL/uL   Hemoglobin 10.2 (L) 12.0 - 15.0 g/dL   HCT 32.0 (L) 36.0 - 46.0 %   MCV 87.4 80.0 - 100.0 fL   MCH 27.9 26.0 - 34.0 pg   MCHC 31.9 30.0 - 36.0 g/dL   RDW 14.8 11.5 - 15.5 %   Platelets 509 (H) 150 - 400 K/uL   nRBC 0.0 0.0 - 0.2 %    Comment: Performed at [facility], [address] [place], [place] [address]  Comprehensive metabolic panel     Status: Abnormal   Collection Time: 06/20/18  4:21 AM  Result Value Ref Range   Sodium 136 135 - 145 mmol/L   Potassium 4.4 3.5 - 5.1 mmol/L   Chloride 105 98 - 111 mmol/L   CO2 21 (L) 22 - 32 mmol/L   Glucose, Bld 85 70 - 99 mg/dL   BUN 18 6 - 20 mg/dL   Creatinine, Ser 1.12 (H) 0.44 - 1.00 mg/dL   Calcium 8.7 (L) 8.9 - 10.3 mg/dL   Total Protein 6.9 6.5 - 8.1 g/dL   Albumin 3.0 (L) 3.5 - 5.0 g/dL   AST 44 (H) 15 - 41 U/L   ALT 55 (H) 0 - 44 U/L   Alkaline Phosphatase 199 (H) 38 - 126 U/L   Total Bilirubin 0.4 0.3 - 1.2 mg/dL   GFR calc non Af Amer >60 >60 mL/min   GFR calc Af Amer >60 >60 mL/min   Anion gap 10 5 - 15    Comment: Performed at [facility], [address] [place], [place] [address]     Assessment/ Plan:   23 y.o. G1P1001 postoperativeday # 8 LTCS readmitted for SOB/CP SOB/ CP improved with Albuterol nebulizer treatments Preeclampsia with severe features -diagnosed postpartum and received magnesium sulfate x 12 hours during original admission. Had an elevated creatinine up to 1.35 after delivery of baby. Creatinine on readmission was normal, but after CT and starting Motrin, creatinine back to 1.12 this AM. Blood pressures with downward trend on labetalol 200 mgm tid (mild range to normotensive). Hepatic function tests mildly elevated on readmission, decreased slightly after  hydration. Platelet count elevated at 509K  Discussed with Dr Glennon Mac plan of management:   IV LR bolus 300 ml then increase rate to 154ml/hr   Discontinue Motrin   Repeat labs this evening (1800)   Nephrology consult if creatinine remains elevated   Continue labetalol  Dalia Heading, CNM

## 2018-06-20 NOTE — Progress Notes (Addendum)
Progress Note-HD #2   S: Feeling well. No SOB or CP  O: BP 121/80 (BP Location: Left Arm)   Pulse 91   Temp 98.6 F (37 C) (Oral)   Resp 18   Ht 4\' 11"  (1.499 m)   Wt 76.7 kg   LMP 09/24/2017 (LMP Unknown)   SpO2 100%   Breastfeeding No   BMI 34.13 kg/m   General: appears comfortable  Results for orders placed or performed during the hospital encounter of 06/19/18 (from the past 24 hour(s))  CBC     Status: Abnormal   Collection Time: 06/20/18  4:21 AM  Result Value Ref Range   WBC 10.6 (H) 4.0 - 10.5 K/uL   RBC 3.66 (L) 3.87 - 5.11 MIL/uL   Hemoglobin 10.2 (L) 12.0 - 15.0 g/dL   HCT 32.0 (L) 36.0 - 46.0 %   MCV 87.4 80.0 - 100.0 fL   MCH 27.9 26.0 - 34.0 pg   MCHC 31.9 30.0 - 36.0 g/dL   RDW 14.8 11.5 - 15.5 %   Platelets 509 (H) 150 - 400 K/uL   nRBC 0.0 0.0 - 0.2 %  Comprehensive metabolic panel     Status: Abnormal   Collection Time: 06/20/18  4:21 AM  Result Value Ref Range   Sodium 136 135 - 145 mmol/L   Potassium 4.4 3.5 - 5.1 mmol/L   Chloride 105 98 - 111 mmol/L   CO2 21 (L) 22 - 32 mmol/L   Glucose, Bld 85 70 - 99 mg/dL   BUN 18 6 - 20 mg/dL   Creatinine, Ser 1.12 (H) 0.44 - 1.00 mg/dL   Calcium 8.7 (L) 8.9 - 10.3 mg/dL   Total Protein 6.9 6.5 - 8.1 g/dL   Albumin 3.0 (L) 3.5 - 5.0 g/dL   AST 44 (H) 15 - 41 U/L   ALT 55 (H) 0 - 44 U/L   Alkaline Phosphatase 199 (H) 38 - 126 U/L   Total Bilirubin 0.4 0.3 - 1.2 mg/dL   GFR calc non Af Amer >60 >60 mL/min   GFR calc Af Amer >60 >60 mL/min   Anion gap 10 5 - 15  Creatinine, serum     Status: None   Collection Time: 06/20/18  6:16 PM  Result Value Ref Range   Creatinine, Ser 0.84 0.44 - 1.00 mg/dL   GFR calc non Af Amer >60 >60 mL/min   GFR calc Af Amer >60 >60 mL/min  Hepatic function panel     Status: Abnormal   Collection Time: 06/20/18  6:16 PM  Result Value Ref Range   Total Protein 6.9 6.5 - 8.1 g/dL   Albumin 3.2 (L) 3.5 - 5.0 g/dL   AST 45 (H) 15 - 41 U/L   ALT 53 (H) 0 - 44 U/L   Alkaline Phosphatase 189 (H) 38 - 126 U/L   Total Bilirubin <0.1 (L) 0.3 - 1.2 mg/dL   Bilirubin, Direct <0.1 0.0 - 0.2 mg/dL   Indirect Bilirubin NOT CALCULATED 0.3 - 0.9 mg/dL    A: Creatinine now normal after discontinuing Motrin and with IV hydration LFTs stable-mildly elevated Normotensive on labetalol  P: Repeat creatinine and LFTs in AM Decrease IV to 50 ml/hr overnight Change Albuterol neb to every 6 hrs prn and Pulmicort to daily Probable discharge in AM

## 2018-06-21 ENCOUNTER — Ambulatory Visit: Payer: Medicaid Other | Admitting: Obstetrics & Gynecology

## 2018-06-21 LAB — HEPATIC FUNCTION PANEL
ALT: 49 U/L — ABNORMAL HIGH (ref 0–44)
AST: 37 U/L (ref 15–41)
Albumin: 3.1 g/dL — ABNORMAL LOW (ref 3.5–5.0)
Alkaline Phosphatase: 176 U/L — ABNORMAL HIGH (ref 38–126)
Bilirubin, Direct: 0.1 mg/dL (ref 0.0–0.2)
Indirect Bilirubin: 0.2 mg/dL — ABNORMAL LOW (ref 0.3–0.9)
Total Bilirubin: 0.3 mg/dL (ref 0.3–1.2)
Total Protein: 6.7 g/dL (ref 6.5–8.1)

## 2018-06-21 LAB — CREATININE, SERUM
Creatinine, Ser: 0.84 mg/dL (ref 0.44–1.00)
GFR calc Af Amer: >60 mL/min (ref >60)
GFR calc non Af Amer: >60 mL/min (ref >60)

## 2018-06-21 MED ORDER — LABETALOL HCL 200 MG PO TABS: 200.0000 mg | ORAL_TABLET | Freq: Two times a day (BID) | ORAL | 1 refills | Status: DC

## 2018-06-21 MED ORDER — MEASLES, MUMPS & RUBELLA VAC IJ SOLR
0.5000 mL | Freq: Once | INTRAMUSCULAR | Status: DC
  Filled 2018-06-21: qty 0.5

## 2018-06-21 MED ORDER — VARICELLA VIRUS VACCINE LIVE 1350 PFU/0.5ML IJ SUSR
0.5000 mL | Freq: Once | INTRAMUSCULAR | Status: DC
  Filled 2018-06-21: qty 0.5

## 2018-06-21 NOTE — Discharge Summary (Addendum)
OB Discharge Summary     Patient Name: Tamara Flynn DOB: 17-Oct-1995 MRN: [id number]  Date of re-admission: 06/19/2018  Admitted for postpartum preeclampsia with multiple other complaints of shortness of breath, chest pressure, headache, breast pain, incisional pain Delivering MD: Prentice Docker  Post Op Day 9  Date of discharge: 06/21/2018  Admitting diagnosis: Pre-eclampsia, postpartum Intrauterine pregnancy: [redacted]w[redacted]d     Secondary diagnosis: none     Discharge diagnosis: Perinatal hypertension controlled with anti-hypertensive                                 Complications: uncomplicated hospital course  Hospital course:  Imaging to rule out PE, EKG, preeclampsia labs, IV fluids, anti-hypertensive medication  Patient reports she is feeling well at time of discharge. Her pain is well controlled with PO medications. She is tolerating regular diet. She is ambulating and voiding without difficulty. She has noticed some visual blurriness following her doses of Labetalol which has been resolving about 2 hours after taking the medication. She denies any headache or epigastric pain. She denies feeling light headed or dizzy. She denies any shortness of breath or chest pain. She has inhaler at home to use as needed for asthma symptoms. Her breasts have been bound and are generally feeling better. Discussed taking Labetalol BID instead of TID with 1 week follow up for BP check. Precautions reviewed. She is encouraged to stay well hydrated.  Physical exam  Vitals:   06/20/18 1931 06/20/18 2341 06/21/18 0402 06/21/18 0758  BP: 121/80 117/72 115/79 117/85  Pulse: 91 88 79 79  Resp: 18 18 18 18   Temp: 98.6 F (37 C) 98.6 F (37 C) 98.4 F (36.9 C) 98.4 F (36.9 C)  TempSrc: Oral Oral Oral Oral  SpO2: 100% 100% 100% 100%  Weight:      Height:       General: alert, cooperative and no distress Lochia: appropriate Uterine Fundus: firm Incision: Healing well with no significant drainage DVT  Evaluation: No evidence of DVT seen on physical exam.  Labs: Lab Results  Component Value Date   WBC 10.6 (H) 06/20/2018   HGB 10.2 (L) 06/20/2018   HCT 32.0 (L) 06/20/2018   MCV 87.4 06/20/2018   PLT 509 (H) 06/20/2018   Results for MODESTY, RUDY (MRN [id number]) as of 06/21/2018 10:27  Ref. Range 06/19/2018 11:31 06/19/2018 11:56 06/19/2018 11:58 06/19/2018 12:59 06/19/2018 14:18 06/20/2018 04:21 06/20/2018 18:16 06/21/2018 85:88  BASIC METABOLIC PANEL Unknown   Rpt (A)       COMPREHENSIVE METABOLIC PANEL Unknown      Rpt (A)    Sodium Latest Ref Range: 135 - 145 mmol/L   138   136    Potassium Latest Ref Range: 3.5 - 5.1 mmol/L   4.2   4.4    Chloride Latest Ref Range: 98 - 111 mmol/L   108   105    CO2 Latest Ref Range: 22 - 32 mmol/L   21 (L)   21 (L)    Glucose Latest Ref Range: 70 - 99 mg/dL   87   85    BUN Latest Ref Range: 6 - 20 mg/dL   15   18    Creatinine Latest Ref Range: 0.44 - 1.00 mg/dL   0.96   1.12 (H) 0.84 0.84  Calcium Latest Ref Range: 8.9 - 10.3 mg/dL   8.8 (L)   8.7 (L)  Anion gap Latest Ref Range: 5 - 15    9   10     Magnesium Latest Ref Range: 1.7 - 2.4 mg/dL   1.6 (L)       Alkaline Phosphatase Latest Ref Range: 38 - 126 U/L   228 (H)   199 (H) 189 (H) 176 (H)  Albumin Latest Ref Range: 3.5 - 5.0 g/dL   3.3 (L)   3.0 (L) 3.2 (L) 3.1 (L)  AST Latest Ref Range: 15 - 41 U/L   50 (H)   44 (H) 45 (H) 37  ALT Latest Ref Range: 0 - 44 U/L   64 (H)   55 (H) 53 (H) 49 (H)  Total Protein Latest Ref Range: 6.5 - 8.1 g/dL   7.7   6.9 6.9 6.7  Bilirubin, Direct Latest Ref Range: 0.0 - 0.2 mg/dL   0.1    <0.1 0.1  Indirect Bilirubin Latest Ref Range: 0.3 - 0.9 mg/dL   0.5    NOT CALCULATED 0.2 (L)  Total Bilirubin Latest Ref Range: 0.3 - 1.2 mg/dL   0.6   0.4 <0.1 (L) 0.3  GFR, Est Non African American Latest Ref Range: >60 mL/min   >60   >60 >60 >60  GFR, Est African American Latest Ref Range: >60 mL/min   >60   >60 >60 >60  WBC Latest Ref Range: 4.0 - 10.5 K/uL   11.3  (H)   10.6 (H)    RBC Latest Ref Range: 3.87 - 5.11 MIL/uL   4.03   3.66 (L)    Hemoglobin Latest Ref Range: 12.0 - 15.0 g/dL   11.4 (L)   10.2 (L)    HCT Latest Ref Range: 36.0 - 46.0 %   35.4 (L)   32.0 (L)    MCV Latest Ref Range: 80.0 - 100.0 fL   87.8   87.4    MCH Latest Ref Range: 26.0 - 34.0 pg   28.3   27.9    MCHC Latest Ref Range: 30.0 - 36.0 g/dL   32.2   31.9    RDW Latest Ref Range: 11.5 - 15.5 %   14.6   14.8    Platelets Latest Ref Range: 150 - 400 K/uL   519 (H)   509 (H)    nRBC Latest Ref Range: 0.0 - 0.2 %   0.0   0.0    Neutrophils Latest Units: %   76       Lymphocytes Latest Units: %   16       Monocytes Relative Latest Units: %   4       Eosinophil Latest Units: %   2       Basophil Latest Units: %   0       Immature Granulocytes Latest Units: %   2       NEUT# Latest Ref Range: 1.7 - 7.7 K/uL   8.6 (H)       Lymphocyte # Latest Ref Range: 0.7 - 4.0 K/uL   1.8       Monocyte # Latest Ref Range: 0.1 - 1.0 K/uL   0.5       Eosinophils Absolute Latest Ref Range: 0.0 - 0.5 K/uL   0.2       Basophils Absolute Latest Ref Range: 0.0 - 0.1 K/uL   0.0       Abs Immature Granulocytes Latest Ref Range: 0.00 - 0.07 K/uL   0.18 (H)  Prothrombin Time Latest Ref Range: 11.4 - 15.2 seconds   13.6       INR Latest Ref Range: 0.8 - 1.2    1.1       Appearance Latest Ref Range: CLEAR    HAZY (A)       Bilirubin Urine Latest Ref Range: NEGATIVE    NEGATIVE       Color, Urine Latest Ref Range: YELLOW    YELLOW (A)       Glucose, UA Latest Ref Range: NEGATIVE mg/dL   NEGATIVE       Hgb urine dipstick Latest Ref Range: NEGATIVE    LARGE (A)       Ketones, ur Latest Ref Range: NEGATIVE mg/dL   NEGATIVE       Nitrite Latest Ref Range: NEGATIVE    NEGATIVE       pH Latest Ref Range: 5.0 - 8.0    6.0       Protein Latest Ref Range: NEGATIVE mg/dL   NEGATIVE       Specific Gravity, Urine Latest Ref Range: 1.005 - 1.030    1.013       Bacteria, UA Latest Ref Range: NONE SEEN    RARE  (A)       Mucus Unknown   PRESENT       RBC / HPF Latest Ref Range: 0 - 5 RBC/hpf   11-20       Squamous Epithelial / LPF Latest Ref Range: 0 - 5    0-5       WBC, UA Latest Ref Range: 0 - 5 WBC/hpf   6-10       Total Protein, Urine Latest Units: mg/dL  17        Protein Creatinine Ratio Latest Ref Range: 0.00 - 0.15 mg/mgCre  0.22 (H)        Creatinine, Urine Latest Units: mg/dL  76        CT ANGIO CHEST PE W/CM &/OR WO CM Unknown     Rpt     DG CHEST PORT 1 VIEW Unknown    Rpt      EKG 12-LEAD Unknown Rpt         Leukocytes,Ua Latest Ref Range: NEGATIVE    MODERATE (A)        CLINICAL DATA:  Shortness of breath and fever.  Recent delivery.  EXAM: PORTABLE CHEST 1 VIEW  COMPARISON:  None.  FINDINGS: UPPER limits normal heart size noted.  There is no evidence of focal airspace disease, pulmonary edema, suspicious pulmonary nodule/mass, pleural effusion, or pneumothorax.  No acute bony abnormalities are identified.  IMPRESSION: No active disease.   Electronically Signed   By: Margarette Canada M.D.   On: 06/19/2018 13:04   CLINICAL DATA:  Status post C-section. Short of breath, diarrhea, and fever.  EXAM: CT ANGIOGRAPHY CHEST WITH CONTRAST  TECHNIQUE: Multidetector CT imaging of the chest was performed using the standard protocol during bolus administration of intravenous contrast. Multiplanar CT image reconstructions and MIPs were obtained to evaluate the vascular anatomy.  CONTRAST:  66mL OMNIPAQUE IOHEXOL 350 MG/ML SOLN  COMPARISON:  None.  FINDINGS: Cardiovascular: There are no filling defects in the pulmonary arterial tree to suggest acute pulmonary thromboembolism. There is no obvious evidence of aortic dissection or aneurysm. Great vessels are grossly patent.  Mediastinum/Nodes: No abnormal mediastinal adenopathy. Residual thymic tissue is noted. Borderline bilateral axillary adenopathy is noted there are also prominent bilateral subpectoral  lymph nodes. The  largest node on the right has a short axis diameter of 10 mm. The largest on left has a short axis diameter of 9 mm. No pericardial effusion. Visualized thyroid is within normal limits. Esophagus is within normal limits.  Lungs/Pleura: No pneumothorax or pleural effusion.  Clear lungs.  Upper Abdomen: Subcentimeter hypodensity is present at the dome of the liver.  Musculoskeletal: No vertebral compression deformity.  Review of the MIP images confirms the above findings.  IMPRESSION: No evidence of acute pulmonary thromboembolism  Borderline bilateral axillary adenopathy. Considerations include inflammatory and neoplastic etiology such as lymphoma. Consider 3 to six-month follow-up to ensure resolution.  Small hypodensity at the dome of the liver. If there is risk or history of malignancy, six-month follow-up MRI is recommended.   Electronically Signed   By: Marybelle Killings M.D.   On: 06/19/2018 14:37   Discharge instruction: per After Visit Summary.  Medications:  Allergies as of 06/21/2018      Reactions   Ciprofloxacin Hcl Hives      Medication List    STOP taking these medications   oxyCODONE 5 MG immediate release tablet Commonly known as:  Oxy IR/ROXICODONE     TAKE these medications   Flovent HFA 220 MCG/ACT inhaler Generic drug:  fluticasone Inhale 1 puff into the lungs 2 (two) times daily.   labetalol 200 MG tablet Commonly known as:  NORMODYNE Take 1 tablet (200 mg total) by mouth 2 (two) times daily.   prenatal multivitamin Tabs tablet Take 1 tablet by mouth daily at 12 noon. GUMMY   ProAir HFA 108 (90 Base) MCG/ACT inhaler Generic drug:  albuterol Inhale 2 puffs into the lungs every 4 (four) hours as needed.            Discharge Care Instructions  (From admission, onward)         Start     Ordered   06/21/18 0000  Discharge wound care:    Comments:  Keep incision dry, clean.   06/21/18 1020           Diet: routine diet  Activity: Advance as tolerated. Pelvic rest for 6 weeks.   Outpatient follow up: Follow-up Information    Will Bonnet, MD. Go in 5 day(s).   Specialty:  Obstetrics and Gynecology Why:  blood pressure check  Contact information: [address] [place] [address] [phone]             Postpartum contraception: None Rhogam Given postpartum: NA Rubella vaccine given postpartum: refused postpartum Varicella vaccine given postpartum: refused postpartum TDaP given antepartum or postpartum: given antepartum  Newborn Data: Live born female  Birth Weight: 7 lb 6.2 oz (3350 g) APGAR: 8, 9  Newborn Delivery   Birth date/time:  06/12/2018 13:49:00 Delivery type:  C-Section, Low Transverse Trial of labor:  Yes C-section categorization:  Primary    Baby Feeding: Formula    SIGNED:  Rod Can, CNM 06/21/2018 10:22 AM

## 2018-06-21 NOTE — Discharge Instructions (Signed)
Postpartum Hypertension  Postpartum hypertension is high blood pressure that remains higher than normal after childbirth. You may not realize that you have postpartum hypertension if your blood pressure is not being checked regularly. In most cases, postpartum hypertension will go away on its own, usually within a week of delivery. However, for some women, medical treatment is required to prevent serious complications, such as seizures or stroke.  What are the causes?  This condition may be caused by one or more of the following:   Hypertension that existed before pregnancy (chronic hypertension).   Hypertension that comes on as a result of pregnancy (gestational hypertension).   Hypertensive disorders during pregnancy (preeclampsia) or seizures in women who have high blood pressure during pregnancy (eclampsia).   A condition in which the liver, platelets, and red blood cells are damaged during pregnancy (HELLP syndrome).   A condition in which the thyroid produces too much hormones (hyperthyroidism).   Other rare problems of the nerves (neurological disorders) or blood disorders.  In some cases, the cause may not be known.  What increases the risk?  The following factors may make you more likely to develop this condition:   Chronic hypertension. In some cases, this may not have been diagnosed before pregnancy.   Obesity.   Type 2 diabetes.   Kidney disease.   History of preeclampsia or eclampsia.   Other medical conditions that change the level of hormones in the body (hormonal imbalance).  What are the signs or symptoms?  As with all types of hypertension, postpartum hypertension may not have any symptoms. Depending on how high your blood pressure is, you may experience:   Headaches. These may be mild, moderate, or severe. They may also be steady, constant, or sudden in onset (thunderclap headache).   Changes in your ability to see (visual changes).   Dizziness.   Shortness of breath.   Swelling  of your hands, feet, lower legs, or face. In some cases, you may have swelling in more than one of these locations.   Heart palpitations or a racing heartbeat.   Difficulty breathing while lying down.   Decrease in the amount of urine that you pass.  Other rare signs and symptoms may include:   Sweating more than usual. This lasts longer than a few days after delivery.   Chest pain.   Sudden dizziness when you get up from sitting or lying down.   Seizures.   Nausea or vomiting.   Abdominal pain.  How is this diagnosed?  This condition may be diagnosed based on the results of a physical exam, blood pressure measurements, and blood and urine tests.  You may also have other tests, such as a CT scan or an MRI, to check for other problems of postpartum hypertension.  How is this treated?  If blood pressure is high enough to require treatment, your options may include:   Medicines to reduce blood pressure (antihypertensives). Tell your health care provider if you are breastfeeding or if you plan to breastfeed. There are many antihypertensive medicines that are safe to take while breastfeeding.   Stopping medicines that may be causing hypertension.   Treating medical conditions that are causing hypertension.   Treating the complications of hypertension, such as seizures, stroke, or kidney problems.  Your health care provider will also continue to monitor your blood pressure closely until it is within a safe range for you.  Follow these instructions at home:   Take over-the-counter and prescription medicines only as   told by your health care provider.   Return to your normal activities as told by your health care provider. Ask your health care provider what activities are safe for you.   Do not use any products that contain nicotine or tobacco, such as cigarettes and e-cigarettes. If you need help quitting, ask your health care provider.   Keep all follow-up visits as told by your health care provider. This  is important.  Contact a health care provider if:   Your symptoms get worse.   You have new symptoms, such as:  ? A headache that does not get better.  ? Dizziness.  ? Visual changes.  Get help right away if:   You suddenly develop swelling in your hands, ankles, or face.   You have sudden, rapid weight gain.   You develop difficulty breathing, chest pain, racing heartbeat, or heart palpitations.   You develop severe pain in your abdomen.   You have any symptoms of a stroke. "BE FAST" is an easy way to remember the main warning signs of a stroke:  ? B - Balance. Signs are dizziness, sudden trouble walking, or loss of balance.  ? E - Eyes. Signs are trouble seeing or a sudden change in vision.  ? F - Face. Signs are sudden weakness or numbness of the face, or the face or eyelid drooping on one side.  ? A - Arms. Signs are weakness or numbness in an arm. This happens suddenly and usually on one side of the body.  ? S - Speech. Signs are sudden trouble speaking, slurred speech, or trouble understanding what people say.  ? T - Time. Time to call emergency services. Write down what time symptoms started.   You have other signs of a stroke, such as:  ? A sudden, severe headache with no known cause.  ? Nausea or vomiting.  ? Seizure.  These symptoms may represent a serious problem that is an emergency. Do not wait to see if the symptoms will go away. Get medical help right away. Call your local emergency services (911 in the [place] Do not drive yourself to the hospital.  Summary   Postpartum hypertension is high blood pressure that remains higher than normal after childbirth.   In most cases, postpartum hypertension will go away on its own, usually within a week of delivery.   For some women, medical treatment is required to prevent serious complications, such as seizures or stroke.  This information is not intended to replace advice given to you by your health care provider. Make sure you discuss any questions  you have with your health care provider.  Document Released: 10/26/2013 Document Revised: 12/13/2016 Document Reviewed: 12/13/2016  Elsevier Interactive Patient Education  2019 [employer]

## 2018-06-28 ENCOUNTER — Encounter: Payer: Self-pay | Admitting: Obstetrics and Gynecology

## 2018-06-28 ENCOUNTER — Other Ambulatory Visit: Payer: Self-pay

## 2018-06-28 ENCOUNTER — Ambulatory Visit (INDEPENDENT_AMBULATORY_CARE_PROVIDER_SITE_OTHER): Payer: Medicaid Other | Admitting: Obstetrics and Gynecology

## 2018-06-28 VITALS — BP 116/74 | Wt 136.0 lb

## 2018-06-28 DIAGNOSIS — O1495 Unspecified pre-eclampsia, complicating the puerperium: Secondary | ICD-10-CM

## 2018-06-28 DIAGNOSIS — R300 Dysuria: Secondary | ICD-10-CM

## 2018-06-28 DIAGNOSIS — J4520 Mild intermittent asthma, uncomplicated: Secondary | ICD-10-CM

## 2018-06-28 MED ORDER — PROAIR HFA 108 (90 BASE) MCG/ACT IN AERS: 2.0000 | INHALATION_SPRAY | RESPIRATORY_TRACT | 0 refills | Status: AC | PRN

## 2018-06-28 MED ORDER — FLOVENT HFA 220 MCG/ACT IN AERO: 1.0000 | INHALATION_SPRAY | Freq: Two times a day (BID) | RESPIRATORY_TRACT | 0 refills | Status: DC

## 2018-06-28 NOTE — Progress Notes (Signed)
Obstetrics & Gynecology Office Visit   Chief Complaint  Patient presents with  . Blood Pressure Check    History of Present Illness: 23 y.o. G5P1001 female who is 16 days postop from a primary c-section. She was readmitted on 4/13 for preeclampsia and was given magnesium sulfate and had a workup to rule out other causes. She was discharged on 4/15 on labetalol 200 mg po bid.  No issues. No headache, visual changes, RUQ pain. Some mild lightheadedness about 1 hour after taking medication.  Requests refill for inhalers.  Some mild dysuria.     Past Medical History:  Diagnosis Date  . Abdominal pain during pregnancy in second trimester 03/13/2018  . Asthma     Past Surgical History:  Procedure Laterality Date  . CESAREAN SECTION N/A 06/12/2018   Procedure: CESAREAN SECTION;  Surgeon: Will Bonnet, MD;  Location: [facility] ORS;  Service: Obstetrics;  Laterality: N/A;  . WISDOM TOOTH EXTRACTION      Gynecologic History: Patient's last menstrual period was 09/24/2017 (lmp unknown).  Obstetric History: G1P1001  Family History  Problem Relation Age of Onset  . Epilepsy Mother   . Epilepsy Father     Social History   Socioeconomic History  . Marital status: Single    Spouse name: Larkin Ina  . Number of children: Not on file  . Years of education: Not on file  . Highest education level: Not on file  Occupational History  . Not on file  Social Needs  . Financial resource strain: Not on file  . Food insecurity:    Worry: Not on file    Inability: Not on file  . Transportation needs:    Medical: Not on file    Non-medical: Not on file  Tobacco Use  . Smoking status: Never Smoker  . Smokeless tobacco: Never Used  Substance and Sexual Activity  . Alcohol use: No  . Drug use: No  . Sexual activity: Yes    Partners: Male    Comment: undecided  Lifestyle  . Physical activity:    Days per week: Not on file    Minutes per session: Not on file  . Stress: Not on file   Relationships  . Social connections:    Talks on phone: Not on file    Gets together: Not on file    Attends religious service: Not on file    Active member of club or organization: Not on file    Attends meetings of clubs or organizations: Not on file    Relationship status: Not on file  . Intimate partner violence:    Fear of current or ex partner: Not on file    Emotionally abused: Not on file    Physically abused: Not on file    Forced sexual activity: Not on file  Other Topics Concern  . Not on file  Social History Narrative  . Not on file    Allergies  Allergen Reactions  . Ciprofloxacin Hcl Hives    Prior to Admission medications   Medication Sig Start Date End Date Taking? Authorizing Provider  FLOVENT HFA 220 MCG/ACT inhaler Inhale 1 puff into the lungs 2 (two) times daily.  11/10/17  Yes [provider]  labetalol (NORMODYNE) 200 MG tablet Take 1 tablet (200 mg total) by mouth 2 (two) times daily. 06/21/18  Yes Rod Can, CNM  Prenatal Vit-Fe Fumarate-FA (PRENATAL MULTIVITAMIN) TABS tablet Take 1 tablet by mouth daily at 12 noon. GUMMY   Yes [provider]  PROAIR HFA 108 (90 Base) MCG/ACT inhaler Inhale 2 puffs into the lungs every 4 (four) hours as needed.  11/10/17  Yes [provider]    Review of Systems  Constitutional: Negative.   HENT: Negative.   Eyes: Negative.   Respiratory: Negative.   Cardiovascular: Negative.   Gastrointestinal: Negative.   Genitourinary: Positive for dysuria. Negative for flank pain, frequency, hematuria and urgency.  Musculoskeletal: Negative.   Skin: Negative.   Neurological: Negative.   Psychiatric/Behavioral: Negative.      Physical Exam BP 116/74   Wt 136 lb (61.7 kg)   LMP 09/24/2017 (LMP Unknown)   BMI 27.47 kg/m  Patient's last menstrual period was 09/24/2017 (lmp unknown). Physical Exam Constitutional:      General: She is not in acute distress.    Appearance: Normal appearance. She  is well-developed.  HENT:     Head: Normocephalic and atraumatic.  Eyes:     General: No scleral icterus.    Conjunctiva/sclera: Conjunctivae normal.  Neck:     Musculoskeletal: Normal range of motion and neck supple.  Cardiovascular:     Rate and Rhythm: Normal rate and regular rhythm.     Heart sounds: No murmur. No friction rub. No gallop.   Pulmonary:     Effort: Pulmonary effort is normal. No respiratory distress.     Breath sounds: Normal breath sounds. No wheezing or rales.  Abdominal:     General: Bowel sounds are normal. There is no distension.     Palpations: Abdomen is soft. There is no mass.     Tenderness: There is no abdominal tenderness. There is no guarding or rebound.  Musculoskeletal: Normal range of motion.  Neurological:     General: No focal deficit present.     Mental Status: She is alert and oriented to person, place, and time.     Cranial Nerves: No cranial nerve deficit.  Skin:    General: Skin is warm and dry.     Findings: No erythema.  Psychiatric:        Mood and Affect: Mood normal.        Behavior: Behavior normal.        Judgment: Judgment normal.     Female chaperone present for pelvic and breast  portions of the physical exam  Assessment: 23 y.o. G60P1001 female here for  1. Preeclampsia in postpartum period   2. Mild intermittent asthma without complication   3. Dysuria      Plan: Problem List Items Addressed This Visit      Cardiovascular and Mediastinum   Preeclampsia in postpartum period - Primary   Relevant Orders   Creatinine, serum    Other Visit Diagnoses    Mild intermittent asthma without complication       Relevant Medications   PROAIR HFA 108 (90 Base) MCG/ACT inhaler   FLOVENT HFA 220 MCG/ACT inhaler   Dysuria       Relevant Orders   Urine Culture     Preeclampsia: decrease labetalol to 100 mg po BID (no signs of asthma exacerbation). Asthma; refill on inhalers Dysuria: urine culture. Urine dipstick equivocal    Prentice Docker, MD 06/28/2018 4:18 PM

## 2018-06-29 LAB — CREATININE, SERUM
Creatinine, Ser: 1 mg/dL (ref 0.57–1.00)
GFR calc Af Amer: 91 mL/min/{1.73_m2} (ref >59)
GFR calc non Af Amer: 79 mL/min/{1.73_m2} (ref >59)

## 2018-07-01 ENCOUNTER — Inpatient Hospital Stay (HOSPITAL_COMMUNITY): Admit: 2018-07-01 | Payer: Self-pay

## 2018-07-02 LAB — URINE CULTURE

## 2018-07-03 ENCOUNTER — Other Ambulatory Visit: Payer: Self-pay | Admitting: Obstetrics and Gynecology

## 2018-07-03 DIAGNOSIS — N3000 Acute cystitis without hematuria: Secondary | ICD-10-CM

## 2018-07-03 MED ORDER — AMOXICILLIN-POT CLAVULANATE 500-125 MG PO TABS: 1.0000 | ORAL_TABLET | Freq: Two times a day (BID) | ORAL | 0 refills | Status: AC

## 2018-07-04 NOTE — Telephone Encounter (Signed)
Could you write her a letter to return to work on 5/4 with restrictions for 2 weeks not to lift anything greater than 25 pounds? She would like it emailed to her. Thank you!

## 2018-07-06 NOTE — Telephone Encounter (Signed)
Can you write her a letter and email it to the email address provided?

## 2018-07-26 ENCOUNTER — Other Ambulatory Visit: Payer: Self-pay

## 2018-07-26 ENCOUNTER — Ambulatory Visit (INDEPENDENT_AMBULATORY_CARE_PROVIDER_SITE_OTHER): Payer: Medicaid Other | Admitting: Obstetrics and Gynecology

## 2018-07-26 ENCOUNTER — Encounter: Payer: Self-pay | Admitting: Obstetrics and Gynecology

## 2018-07-26 ENCOUNTER — Other Ambulatory Visit (HOSPITAL_COMMUNITY)
Admission: RE | Admit: 2018-07-26 | Discharge: 2018-07-26 | Disposition: A | Discharge status: Home / Self Care | Payer: Medicaid Other | Source: Ambulatory Visit | Attending: Obstetrics and Gynecology | Admitting: Obstetrics and Gynecology

## 2018-07-26 DIAGNOSIS — Z124 Encounter for screening for malignant neoplasm of cervix: Secondary | ICD-10-CM | POA: Diagnosis present

## 2018-07-26 DIAGNOSIS — Z113 Encounter for screening for infections with a predominantly sexual mode of transmission: Secondary | ICD-10-CM | POA: Insufficient documentation

## 2018-07-26 DIAGNOSIS — O1415 Severe pre-eclampsia, complicating the puerperium: Secondary | ICD-10-CM

## 2018-07-26 NOTE — Progress Notes (Signed)
Postpartum Visit   Chief Complaint  Patient presents with  . Postpartum Care   History of Present Illness: Patient is a 23 y.o. G1P1001 presents for postpartum visit.  Date of delivery: 06/12/2018 Type of delivery: C-Section Pregnancy or labor problems:  no Any problems since the delivery:  Hypertension, taking medication   Newborn Details:  SINGLETON :  1. Baby's name: Dana Allan. Birth weight: 7.6lb Maternal Details:  Breast Feeding:  BOTTLE Post partum depression/anxiety noted:  no Edinburgh Post-Partum Depression Score:  6  Date of last PAP: unknown  Past Medical History:  Diagnosis Date  . Abdominal pain during pregnancy in second trimester 03/13/2018  . Asthma     Past Surgical History:  Procedure Laterality Date  . CESAREAN SECTION N/A 06/12/2018   Procedure: CESAREAN SECTION;  Surgeon: Will Bonnet, MD;  Location: [facility] ORS;  Service: Obstetrics;  Laterality: N/A;  . WISDOM TOOTH EXTRACTION      Prior to Admission medications   Medication Sig Start Date End Date Taking? Authorizing Provider  FLOVENT HFA 220 MCG/ACT inhaler Inhale 1 puff into the lungs 2 (two) times daily. 06/28/18  Yes Will Bonnet, MD  labetalol (NORMODYNE) 200 MG tablet Take 1 tablet (200 mg total) by mouth 2 (two) times daily. 06/21/18  Yes Rod Can, CNM  Prenatal Vit-Fe Fumarate-FA (PRENATAL MULTIVITAMIN) TABS tablet Take 1 tablet by mouth daily at 12 noon. GUMMY   Yes [provider]  PROAIR HFA 108 (90 Base) MCG/ACT inhaler Inhale 2 puffs into the lungs every 4 (four) hours as needed. 06/28/18  Yes Will Bonnet, MD    Allergies  Allergen Reactions  . Ciprofloxacin Hcl Hives     Social History   Socioeconomic History  . Marital status: Single    Spouse name: Larkin Ina  . Number of children: Not on file  . Years of education: Not on file  . Highest education level: Not on file  Occupational History  . Not on file  Social Needs  . Financial resource strain: Not  on file  . Food insecurity:    Worry: Not on file    Inability: Not on file  . Transportation needs:    Medical: Not on file    Non-medical: Not on file  Tobacco Use  . Smoking status: Never Smoker  . Smokeless tobacco: Never Used  Substance and Sexual Activity  . Alcohol use: No  . Drug use: No  . Sexual activity: Yes    Partners: Male    Comment: undecided  Lifestyle  . Physical activity:    Days per week: Not on file    Minutes per session: Not on file  . Stress: Not on file  Relationships  . Social connections:    Talks on phone: Not on file    Gets together: Not on file    Attends religious service: Not on file    Active member of club or organization: Not on file    Attends meetings of clubs or organizations: Not on file    Relationship status: Not on file  . Intimate partner violence:    Fear of current or ex partner: Not on file    Emotionally abused: Not on file    Physically abused: Not on file    Forced sexual activity: Not on file  Other Topics Concern  . Not on file  Social History Narrative  . Not on file    Family History  Problem Relation Age of Onset  .  Epilepsy Mother   . Epilepsy Father     Review of Systems  Constitutional: Negative.   HENT: Negative.   Eyes: Negative.   Respiratory: Negative.   Cardiovascular: Negative.   Gastrointestinal: Negative.   Genitourinary: Negative.   Musculoskeletal: Negative.   Skin: Negative.   Neurological: Negative.   Psychiatric/Behavioral: Negative.      Physical Exam BP 118/78   Ht 4\' 11"  (1.499 m)   Wt 135 lb (61.2 kg)   LMP 07/19/2018   BMI 27.27 kg/m   Physical Exam Constitutional:      General: She is not in acute distress.    Appearance: Normal appearance. She is well-developed.  Genitourinary:     Pelvic exam was performed with patient in the lithotomy position.     Vulva, inguinal canal, urethra, bladder, vagina, uterus, right adnexa and left adnexa normal.     No posterior  fourchette tenderness, injury or lesion present.     No cervical friability, lesion, bleeding or polyp.     Uterus is anteverted.  HENT:     Head: Normocephalic and atraumatic.  Eyes:     General: No scleral icterus.    Conjunctiva/sclera: Conjunctivae normal.  Neck:     Musculoskeletal: Normal range of motion and neck supple.  Cardiovascular:     Rate and Rhythm: Normal rate and regular rhythm.     Heart sounds: No murmur. No friction rub. No gallop.   Pulmonary:     Effort: Pulmonary effort is normal. No respiratory distress.     Breath sounds: Normal breath sounds. No wheezing or rales.  Abdominal:     General: Bowel sounds are normal. There is no distension.     Palpations: Abdomen is soft. There is no mass.     Tenderness: There is no abdominal tenderness. There is no guarding or rebound.     Comments: without erythema, induration, warmth, and tenderness. It is clean, dry, and intact.    Musculoskeletal: Normal range of motion.  Neurological:     General: No focal deficit present.     Mental Status: She is alert and oriented to person, place, and time.     Cranial Nerves: No cranial nerve deficit.  Skin:    General: Skin is warm and dry.     Findings: No erythema.  Psychiatric:        Mood and Affect: Mood normal.        Behavior: Behavior normal.        Judgment: Judgment normal.      Female Chaperone present during breast and/or pelvic exam.  Assessment: 23 y.o. G1P1001 presenting for 6 week postpartum visit  Plan: Problem List Items Addressed This Visit      Cardiovascular and Mediastinum   Hypertension in pregnancy, preeclampsia, severe, delivered/postpartum    Other Visit Diagnoses    Postpartum care and examination    -  Primary   Relevant Orders   Cytology - PAP   Screen for STD (sexually transmitted disease)       Relevant Orders   Cytology - PAP   Pap smear for cervical cancer screening       Relevant Orders   Cytology - PAP       1)  Contraception Education given regarding options for contraception: She declines contraception at this time.  2)  Pap - ASCCP guidelines and rational discussed.  Patient opts for routine screening interval  3) Patient underwent screening for postpartum depression with no concerns noted.  4) Preeclampsia: Stop medication at this time.  5) Follow up 1 year for routine annual exam  Prentice Docker, MD 07/26/2018 2:04 PM

## 2018-07-28 LAB — CYTOLOGY - PAP
Chlamydia: NEGATIVE
Diagnosis: NEGATIVE
Neisseria Gonorrhea: NEGATIVE
Trichomonas: NEGATIVE

## 2019-03-19 ENCOUNTER — Emergency Department
Admission: EM | Admit: 2019-03-19 | Discharge: 2019-03-19 | Disposition: A | Discharge status: Home / Self Care | Payer: Medicaid Other | Attending: Emergency Medicine | Admitting: Emergency Medicine

## 2019-03-19 ENCOUNTER — Other Ambulatory Visit: Payer: Self-pay

## 2019-03-19 DIAGNOSIS — J45909 Unspecified asthma, uncomplicated: Secondary | ICD-10-CM | POA: Diagnosis not present

## 2019-03-19 DIAGNOSIS — L03818 Cellulitis of other sites: Secondary | ICD-10-CM

## 2019-03-19 DIAGNOSIS — H6013 Cellulitis of external ear, bilateral: Secondary | ICD-10-CM | POA: Insufficient documentation

## 2019-03-19 DIAGNOSIS — R600 Localized edema: Secondary | ICD-10-CM | POA: Diagnosis present

## 2019-03-19 MED ORDER — CETIRIZINE HCL 10 MG PO CAPS: 10.0000 mg | ORAL_CAPSULE | Freq: Every day | ORAL | 0 refills | Status: DC

## 2019-03-19 MED ORDER — SULFAMETHOXAZOLE-TRIMETHOPRIM 800-160 MG PO TABS: 1.0000 | ORAL_TABLET | Freq: Two times a day (BID) | ORAL | 0 refills | Status: DC

## 2019-03-19 NOTE — ED Provider Notes (Signed)
[facility] Emergency Department Provider Note  ____________________________________________  Time seen: Approximately 10:18 AM  I have reviewed the triage vital signs and the nursing notes.   HISTORY  Chief Complaint Otalgia   HPI Tamara Flynn is a 24 y.o. female presents to the emergency department for treatment and evaluation of swelling over the areas of tragus piercings. She had them inserted in November. Over the past 2 days she has had some clear drainage and swelling on both areas. No fever or other symptoms.  Past Medical History:  Diagnosis Date  . Abdominal pain during pregnancy in second trimester 03/13/2018  . Asthma     Patient Active Problem List   Diagnosis Date Noted  . Preeclampsia in postpartum period 06/19/2018  . Hypertension in pregnancy, preeclampsia, severe, delivered/postpartum 06/13/2018  . Delivery of pregnancy by cesarean section 06/13/2018  . Failed induction of labor 06/13/2018  . Fetal intolerance to labor, delivered, current hospitalization 06/13/2018  . Gestational hypertension 06/07/2018  . Polyhydramnios affecting pregnancy 04/28/2018  . Supervision of high risk pregnancy, antepartum 11/18/2017    Past Surgical History:  Procedure Laterality Date  . CESAREAN SECTION N/A 06/12/2018   Procedure: CESAREAN SECTION;  Surgeon: Will Bonnet, MD;  Location: [facility] ORS;  Service: Obstetrics;  Laterality: N/A;  . WISDOM TOOTH EXTRACTION      Prior to Admission medications   Medication Sig Start Date End Date Taking? Authorizing Provider  Cetirizine HCl 10 MG CAPS Take 1 capsule (10 mg total) by mouth daily. 03/19/19   Biddie Sebek B, FNP  FLOVENT HFA 220 MCG/ACT inhaler Inhale 1 puff into the lungs 2 (two) times daily. 06/28/18   Will Bonnet, MD  Prenatal Vit-Fe Fumarate-FA (PRENATAL MULTIVITAMIN) TABS tablet Take 1 tablet by mouth daily at 12 noon. GUMMY    [provider]  PROAIR HFA 108 (90 Base)  MCG/ACT inhaler Inhale 2 puffs into the lungs every 4 (four) hours as needed. 06/28/18   Will Bonnet, MD  sulfamethoxazole-trimethoprim (BACTRIM DS) 800-160 MG tablet Take 1 tablet by mouth 2 (two) times daily. 03/19/19   Josilyn Shippee, Johnette Abraham B, FNP    Allergies Ciprofloxacin hcl  Family History  Problem Relation Age of Onset  . Epilepsy Mother   . Epilepsy Father     Social History Social History   Tobacco Use  . Smoking status: Never Smoker  . Smokeless tobacco: Never Used  Substance Use Topics  . Alcohol use: No  . Drug use: No    Review of Systems  Constitutional: Negative for fever. Respiratory: Negative for cough or shortness of breath.  Musculoskeletal: Negative for myalgias Skin: Positive for drainage and tenderness over sites of tragus piercings. Neurological: Negative for numbness or paresthesias. ____________________________________________   PHYSICAL EXAM:  VITAL SIGNS: ED Triage Vitals  Enc Vitals Group     BP 03/19/19 0927 127/67     Pulse Rate 03/19/19 0927 80     Resp 03/19/19 0927 18     Temp 03/19/19 0927 98.6 F (37 C)     Temp Source 03/19/19 0927 Oral     SpO2 03/19/19 0927 97 %     Weight 03/19/19 0927 140 lb (63.5 kg)     Height 03/19/19 0927 4\' 11"  (1.499 m)     Head Circumference --      Peak Flow --      Pain Score 03/19/19 0929 4     Pain Loc --      Pain Edu? --  Excl. in [place]? --      Constitutional: Well appearing. Eyes: Conjunctivae are clear without discharge or drainage. Nose: No rhinorrhea noted. Mouth/Throat: Airway is patent.  Neck: No stridor. Unrestricted range of motion observed. Cardiovascular: Capillary refill is <3 seconds.  Respiratory: Respirations are even and unlabored.. Musculoskeletal: Unrestricted range of motion observed. Neurologic: Awake, alert, and oriented x 4.  Skin:  Mild edema over tragus sites with focal surrounding erythema and clear drainage.  ____________________________________________    LABS (all labs ordered are listed, but only abnormal results are displayed)  Labs Reviewed - No data to display ____________________________________________  EKG  Not indicated. ____________________________________________  RADIOLOGY  Not indicated. ____________________________________________   PROCEDURES  Procedures ____________________________________________   INITIAL IMPRESSION / ASSESSMENT AND PLAN / ED COURSE  [name] is a 24 y.o. female presents to the emergency department for treatment and evaluation of symptoms as described in HPI. She will be treated with Bactrim. She was also advised the best thing to do is remove the piercings, but she chooses to keep them in. If the Bactrim isn't helping, she will remove them. She was advised to follow up with primary care provider or return to the ER for symptoms that change or worsen.   Medications - No data to display   Pertinent labs & imaging results that were available during my care of the patient were reviewed by me and considered in my medical decision making (see chart for details).  ____________________________________________   FINAL CLINICAL IMPRESSION(S) / ED DIAGNOSES  Final diagnoses:  Cellulitis of other specified site    ED Discharge Orders         Ordered    sulfamethoxazole-trimethoprim (BACTRIM DS) 800-160 MG tablet  2 times daily     03/19/19 1018    Cetirizine HCl 10 MG CAPS  Daily     03/19/19 1018           Note:  This document was prepared using Dragon voice recognition software and may include unintentional dictation errors.   Victorino Dike, FNP 03/19/19 1431    Nance Pear, MD 03/19/19 1444

## 2019-03-19 NOTE — ED Notes (Signed)
See triage note  Presents with bilateral ear pain with some drainage  Afebrile on arrival

## 2019-03-19 NOTE — ED Triage Notes (Signed)
Pt c/o BL ear pain with drainage for the past 2 days,

## 2019-04-02 ENCOUNTER — Other Ambulatory Visit: Payer: Self-pay

## 2019-04-02 ENCOUNTER — Ambulatory Visit (INDEPENDENT_AMBULATORY_CARE_PROVIDER_SITE_OTHER): Payer: Medicaid Other | Admitting: Obstetrics and Gynecology

## 2019-04-02 VITALS — BP 126/74 | Wt 142.0 lb

## 2019-04-02 DIAGNOSIS — L905 Scar conditions and fibrosis of skin: Secondary | ICD-10-CM | POA: Diagnosis not present

## 2019-04-02 DIAGNOSIS — T8131XA Disruption of external operation (surgical) wound, not elsewhere classified, initial encounter: Secondary | ICD-10-CM

## 2019-04-02 DIAGNOSIS — T148XXA Other injury of unspecified body region, initial encounter: Secondary | ICD-10-CM

## 2019-04-02 NOTE — Progress Notes (Signed)
Obstetrics & Gynecology Office Visit   Chief Complaint  Patient presents with  . Follow-up  incision issues  History of Present Illness: 24 y.o. G18P1001 female who delivered by c-section in 06/2018.  She has had an uncomplicated course since her delivery. She had a small opening on the right aspect of her incision which had a delay in closing. She states today that the opening has never closed.  She notes leakage of pus and blood through this opening.  She denies fevers, chills, and any other concerns with her incision apart from perhaps some keloid formation.   Past Medical History:  Diagnosis Date  . Abdominal pain during pregnancy in second trimester 03/13/2018  . Asthma     Past Surgical History:  Procedure Laterality Date  . CESAREAN SECTION N/A 06/12/2018   Procedure: CESAREAN SECTION;  Surgeon: Will Bonnet, MD;  Location: [facility] ORS;  Service: Obstetrics;  Laterality: N/A;  . WISDOM TOOTH EXTRACTION      Gynecologic History: Patient's last menstrual period was 03/16/2019 (exact date).  Obstetric History: G1P1001  Family History  Problem Relation Age of Onset  . Epilepsy Mother   . Epilepsy Father     Social History   Socioeconomic History  . Marital status: Single    Spouse name: Larkin Ina  . Number of children: Not on file  . Years of education: Not on file  . Highest education level: Not on file  Occupational History  . Not on file  Tobacco Use  . Smoking status: Never Smoker  . Smokeless tobacco: Never Used  Substance and Sexual Activity  . Alcohol use: No  . Drug use: No  . Sexual activity: Yes    Partners: Male    Comment: undecided  Other Topics Concern  . Not on file  Social History Narrative  . Not on file   Social Determinants of Health   Financial Resource Strain:   . Difficulty of Paying Living Expenses: Not on file  Food Insecurity:   . Worried About Charity fundraiser in the Last Year: Not on file  . Ran Out of Food in the Last Year:  Not on file  Transportation Needs:   . Lack of Transportation (Medical): Not on file  . Lack of Transportation (Non-Medical): Not on file  Physical Activity:   . Days of Exercise per Week: Not on file  . Minutes of Exercise per Session: Not on file  Stress:   . Feeling of Stress : Not on file  Social Connections:   . Frequency of Communication with Friends and Family: Not on file  . Frequency of Social Gatherings with Friends and Family: Not on file  . Attends Religious Services: Not on file  . Active Member of Clubs or Organizations: Not on file  . Attends Archivist Meetings: Not on file  . Marital Status: Not on file  Intimate Partner Violence:   . Fear of Current or Ex-Partner: Not on file  . Emotionally Abused: Not on file  . Physically Abused: Not on file  . Sexually Abused: Not on file    Allergies  Allergen Reactions  . Ciprofloxacin Hcl Hives  . Sulfa Antibiotics     Prior to Admission medications   Medication Sig Start Date End Date Taking? Authorizing Provider  doxycycline (MONODOX) 100 MG capsule Take by mouth. 02/22/19 05/23/19 Yes [provider]  FLOVENT HFA 220 MCG/ACT inhaler Inhale 1 puff into the lungs 2 (two) times daily. 06/28/18  Yes  Will Bonnet, MD  PROAIR HFA 108 [phone] Base) MCG/ACT inhaler Inhale 2 puffs into the lungs every 4 (four) hours as needed. 06/28/18  Yes Will Bonnet, MD  Cetirizine HCl 10 MG CAPS Take 1 capsule (10 mg total) by mouth daily. Patient not taking: Reported on 04/02/2019 03/19/19   Victorino Dike, FNP  Prenatal Vit-Fe Fumarate-FA (PRENATAL MULTIVITAMIN) TABS tablet Take 1 tablet by mouth daily at 12 noon. GUMMY    [provider]  sulfamethoxazole-trimethoprim (BACTRIM DS) 800-160 MG tablet Take 1 tablet by mouth 2 (two) times daily. Patient not taking: Reported on 04/02/2019 03/19/19   Victorino Dike, FNP    Review of Systems  Constitutional: Negative.   HENT: Negative.   Eyes: Negative.     Respiratory: Negative.   Cardiovascular: Negative.   Gastrointestinal: Negative.        See HPI for incision-specific issues  Genitourinary: Negative.   Musculoskeletal: Negative.   Skin: Negative.   Neurological: Negative.   Psychiatric/Behavioral: Negative.      Physical Exam BP 126/74   Wt 142 lb (64.4 kg)   LMP 03/16/2019 (Exact Date)   BMI 28.68 kg/m  Patient's last menstrual period was 03/16/2019 (exact date). Physical Exam Constitutional:      General: She is not in acute distress.    Appearance: Normal appearance. She is well-developed.  HENT:     Head: Normocephalic and atraumatic.  Eyes:     General: No scleral icterus.    Conjunctiva/sclera: Conjunctivae normal.  Cardiovascular:     Rate and Rhythm: Normal rate and regular rhythm.     Heart sounds: No murmur. No friction rub. No gallop.   Pulmonary:     Effort: Pulmonary effort is normal. No respiratory distress.     Breath sounds: Normal breath sounds. No wheezing or rales.  Abdominal:     General: Bowel sounds are normal. There is no distension.     Palpations: Abdomen is soft. There is no mass.     Tenderness: There is no abdominal tenderness. There is no guarding or rebound.       Comments: without erythema, induration, warmth, and tenderness. It is clean, dry, and intact.  The right-most aspect of her incision, there is a small dimple area. This area is shallow and there is no surrounding inflammation, induration, warmth, tenderness.  The area is probed for retained suture, but none is found. No pus or blood is able to be expressed.  The base of the small area appears to be healed and skin has formed at the base of this incision.  Musculoskeletal:        General: Normal range of motion.     Cervical back: Normal range of motion and neck supple.  Neurological:     General: No focal deficit present.     Mental Status: She is alert and oriented to person, place, and time.     Cranial Nerves: No cranial  nerve deficit.  Skin:    General: Skin is warm and dry.     Findings: No erythema.  Psychiatric:        Mood and Affect: Mood normal.        Behavior: Behavior normal.        Judgment: Judgment normal.     Female chaperone present for pelvic and breast  portions of the physical exam  Assessment: 24 y.o. G56P1001 female here for  1. Skin wound from surgical incision      Plan:  Problem List Items Addressed This Visit    None    Visit Diagnoses    Skin wound from surgical incision    -  Primary     I can find no evidence of infection today. There is no leakage of pus or blood.  I have encouraged the patient to return to clinic the same day as she has pus and blood leaking. I will work in on my schedule, if necessary.  15 minutes spent in face to face discussion with > 50% spent in counseling,management, and coordination of care of her skin wound from her surgical incision.   Prentice Docker, MD 04/02/2019 4:18 PM

## 2019-04-03 ENCOUNTER — Encounter: Payer: Self-pay | Admitting: Obstetrics and Gynecology

## 2019-07-11 ENCOUNTER — Ambulatory Visit: Payer: Medicaid Other | Admitting: Obstetrics and Gynecology

## 2019-08-07 ENCOUNTER — Other Ambulatory Visit: Payer: Self-pay

## 2019-08-07 ENCOUNTER — Other Ambulatory Visit (HOSPITAL_COMMUNITY)
Admission: RE | Admit: 2019-08-07 | Discharge: 2019-08-07 | Disposition: A | Discharge status: Home / Self Care | Payer: Medicaid Other | Source: Ambulatory Visit | Attending: Advanced Practice Midwife | Admitting: Advanced Practice Midwife

## 2019-08-07 ENCOUNTER — Ambulatory Visit (INDEPENDENT_AMBULATORY_CARE_PROVIDER_SITE_OTHER): Payer: Medicaid Other | Admitting: Advanced Practice Midwife

## 2019-08-07 ENCOUNTER — Encounter: Payer: Self-pay | Admitting: Advanced Practice Midwife

## 2019-08-07 VITALS — BP 122/74 | Ht 59.0 in | Wt 147.0 lb

## 2019-08-07 DIAGNOSIS — R102 Pelvic and perineal pain: Secondary | ICD-10-CM | POA: Diagnosis present

## 2019-08-07 DIAGNOSIS — N898 Other specified noninflammatory disorders of vagina: Secondary | ICD-10-CM | POA: Diagnosis present

## 2019-08-08 ENCOUNTER — Ambulatory Visit: Payer: Self-pay

## 2019-08-08 NOTE — Progress Notes (Signed)
Patient ID: Tamara Flynn, female   DOB: 02-Dec-1995, 24 y.o.   MRN: [id number]  Reason for Consult: Vaginal Discharge    Subjective:  Date of Service: 08/07/2019  HPI:  Tamara Flynn is a 24 y.o. female being seen for vaginal pain and discharge. She has recently begun a relationship with the father of her baby. She has previously been in a same sex relationship. She reports an episode of pain during and after intercourse that she describes as an ache. The pain lingered after intercourse for a day and made it difficult for her to walk. She has also noticed a non-fishy odor and some thin discharge that is white and possibly yellow that started 3 weeks ago after her period. She has had itching today. She does have a history of BV and yeast and has used suppressive treatment. She does not use a condom. She had STD testing done about a month ago, however that was only a few weeks after she started the new relationship and she is concerned that she might have had the testing done too soon. She would like to have STD testing repeated and also check for vaginitis. She denies urinary tract symptoms.  Wet prep in the office is negative today. We will also send swab for STDs and to confirm vaginitis result. We discussed possible causes of pain with intercourse. The way she describes the pain it is likely related to deep penetration/cervical pain.   Past Medical History:  Diagnosis Date  . Abdominal pain during pregnancy in second trimester 03/13/2018  . Asthma    Family History  Problem Relation Age of Onset  . Epilepsy Mother   . Epilepsy Father    Past Surgical History:  Procedure Laterality Date  . CESAREAN SECTION N/A 06/12/2018   Procedure: CESAREAN SECTION;  Surgeon: Will Bonnet, MD;  Location: [facility] ORS;  Service: Obstetrics;  Laterality: N/A;  . WISDOM TOOTH EXTRACTION      Short Social History:  Social History   Tobacco Use  . Smoking status: Never Smoker  . Smokeless  tobacco: Never Used  Substance Use Topics  . Alcohol use: No    Allergies  Allergen Reactions  . Ciprofloxacin Hcl Hives  . Sulfa Antibiotics     Current Outpatient Medications  Medication Sig Dispense Refill  . Clindamycin-Benzoyl Per, Refr, gel Apply once daily in the morning to red acne lesions as needed until resolved.    . drospirenone-ethinyl estradiol (YAZ) 3-0.02 MG tablet Take by mouth.    . ISOtretinoin (ACCUTANE) 40 MG capsule Take by mouth.    . tretinoin (RETIN-A) 0.025 % cream Apply to face nightly (avoid eyelids; start twice weekly and increase by 1 night weekly as tolerated)    . Cetirizine HCl 10 MG CAPS Take 1 capsule (10 mg total) by mouth daily. (Patient not taking: Reported on 04/02/2019) 30 capsule 0  . FLOVENT HFA 220 MCG/ACT inhaler Inhale 1 puff into the lungs 2 (two) times daily. 1 Inhaler 0  . Prenatal Vit-Fe Fumarate-FA (PRENATAL MULTIVITAMIN) TABS tablet Take 1 tablet by mouth daily at 12 noon. GUMMY    . PROAIR HFA 108 (90 Base) MCG/ACT inhaler Inhale 2 puffs into the lungs every 4 (four) hours as needed. 1 Inhaler 0  . sulfamethoxazole-trimethoprim (BACTRIM DS) 800-160 MG tablet Take 1 tablet by mouth 2 (two) times daily. (Patient not taking: Reported on 04/02/2019) 20 tablet 0   No current facility-administered medications for this visit.   Review of  Systems  Constitutional: Negative for chills and fever.  HENT: Negative for congestion, ear discharge, ear pain, hearing loss, sinus pain and sore throat.   Eyes: Negative for blurred vision and double vision.  Respiratory: Negative for cough, shortness of breath and wheezing.   Cardiovascular: Negative for chest pain, palpitations and leg swelling.  Gastrointestinal: Negative for abdominal pain, blood in stool, constipation, diarrhea, heartburn, melena, nausea and vomiting.  Genitourinary: Negative for dysuria, flank pain, frequency, hematuria and urgency.       Positive for vaginal pain, discharge, odor,  itching  Musculoskeletal: Negative for back pain, joint pain and myalgias.  Skin: Negative for itching and rash.  Neurological: Negative for dizziness, tingling, tremors, sensory change, speech change, focal weakness, seizures, loss of consciousness, weakness and headaches.  Endo/Heme/Allergies: Negative for environmental allergies. Does not bruise/bleed easily.  Psychiatric/Behavioral: Negative for depression, hallucinations, memory loss, substance abuse and suicidal ideas. The patient is not nervous/anxious and does not have insomnia.        Objective:  Objective   Vitals:   08/07/19 1607  BP: 122/74  Weight: 147 lb (66.7 kg)  Height: 4\' 11"  (1.499 m)   Body mass index is 29.69 kg/m. Constitutional: Well nourished, well developed female in no acute distress.  HEENT: normal Skin: Warm and dry.  Cardiovascular: Regular rate and rhythm.   Respiratory: Clear to auscultation bilateral. Normal respiratory effort Neuro: DTRs 2+, Cranial nerves grossly intact Psych: Alert and Oriented x3. No memory deficits. Normal mood and affect.  MS: normal gait, normal bilateral lower extremity ROM/strength/stability.  Pelvic exam:  is not limited by body habitus EGBUS: within normal limits Vagina: within normal limits and with normal mucosa, scant thin white discharge Cervix: normal appearance  Data:  Wet prep negative for clue cells, whiff, yeast  Assessment/Plan:     24 y.o. G1 P49 female with dyspareunia likely cervical pain, less likely vaginitis or STD  Aptima swab: STD/vaginitis  Follow up as needed after lab results   [facility] 08/08/2019, 2:47 PM

## 2019-08-09 LAB — CERVICOVAGINAL ANCILLARY ONLY
Bacterial Vaginitis (gardnerella): NEGATIVE
Candida Glabrata: NEGATIVE
Candida Vaginitis: POSITIVE — AB
Chlamydia: NEGATIVE
Comment: NEGATIVE
Comment: NEGATIVE
Comment: NEGATIVE
Comment: NEGATIVE
Comment: NEGATIVE
Comment: NORMAL
Neisseria Gonorrhea: NEGATIVE
Trichomonas: NEGATIVE

## 2019-08-10 ENCOUNTER — Other Ambulatory Visit: Payer: Self-pay | Admitting: Advanced Practice Midwife

## 2019-08-10 DIAGNOSIS — B3731 Acute candidiasis of vulva and vagina: Secondary | ICD-10-CM

## 2019-08-10 DIAGNOSIS — B373 Candidiasis of vulva and vagina: Secondary | ICD-10-CM

## 2019-08-10 MED ORDER — FLUCONAZOLE 150 MG PO TABS: 150.0000 mg | ORAL_TABLET | Freq: Once | ORAL | 1 refills | Status: AC

## 2019-08-10 NOTE — Progress Notes (Unsigned)
Rx diflucan sent for vaginal yeast infection.

## 2019-11-19 ENCOUNTER — Other Ambulatory Visit: Payer: Self-pay

## 2019-11-19 ENCOUNTER — Ambulatory Visit: Payer: Medicaid Other | Admitting: Obstetrics and Gynecology

## 2019-11-19 ENCOUNTER — Encounter: Payer: Self-pay | Admitting: Obstetrics and Gynecology

## 2019-11-19 VITALS — BP 106/78 | HR 94 | Ht 59.0 in | Wt 140.0 lb

## 2019-11-19 DIAGNOSIS — Z13220 Encounter for screening for lipoid disorders: Secondary | ICD-10-CM

## 2019-11-19 DIAGNOSIS — Z1329 Encounter for screening for other suspected endocrine disorder: Secondary | ICD-10-CM | POA: Diagnosis not present

## 2019-11-19 DIAGNOSIS — Z131 Encounter for screening for diabetes mellitus: Secondary | ICD-10-CM

## 2019-11-19 DIAGNOSIS — E663 Overweight: Secondary | ICD-10-CM | POA: Diagnosis not present

## 2019-11-19 DIAGNOSIS — K429 Umbilical hernia without obstruction or gangrene: Secondary | ICD-10-CM | POA: Diagnosis not present

## 2019-11-19 DIAGNOSIS — Z6828 Body mass index (BMI) 28.0-28.9, adult: Secondary | ICD-10-CM | POA: Diagnosis not present

## 2019-11-19 MED ORDER — PHENTERMINE HCL 37.5 MG PO TABS: 37.5000 mg | ORAL_TABLET | Freq: Every day | ORAL | 0 refills | Status: DC

## 2019-11-19 NOTE — Progress Notes (Signed)
Gynecology Office Visit  Chief Complaint:  Chief Complaint  Patient presents with  . Weight Loss    losing weight x past month, eating but not gaining weight    History of Present Illness: Patientis a 24 y.o. G5P1001 female, who presents for the evaluation of weight gain. She has been interested in loosing weight postpartum but has been unable to achieve meaningful weight loss on her own. The patient states the following issues have contributed to her weight problem: being in school, parenting have led to decrease time for physical activity The patient has no additional symptoms. The patient specifically denies memory loss, muscle weakness, excessive thirst, and polyuria. Weight related co-morbidities include umbilical hernia. The patient's past medical history is notable for  Preeclampsia with her pregnancy. She has tried not tried interventions in the past,  Review of Systems: 10 point review of systems negative unless otherwise noted in HPI  Past Medical History:  Patient Active Problem List   Diagnosis Date Noted  . Preeclampsia in postpartum period 06/19/2018  . Hypertension in pregnancy, preeclampsia, severe, delivered/postpartum 06/13/2018  . Delivery of pregnancy by cesarean section 06/13/2018  . Failed induction of labor 06/13/2018  . Fetal intolerance to labor, delivered, current hospitalization 06/13/2018  . Gestational hypertension 06/07/2018  . Polyhydramnios affecting pregnancy 04/28/2018  . Supervision of high risk pregnancy, antepartum 11/18/2017    Clinic [facility] Prenatal Labs  Dating  LMP=9wk [place] Blood type: B/Positive/-- (09/13 1055)   Genetic Screen Declined Antibody:Negative (09/13 1055)  Anatomic [place] Complete Rubella: <0.90 (09/13 1055) Varicella: Non-immune  GTT 115 RPR: Non Reactive (09/13 1055)   Rhogam  not needed HBsAg: Negative (09/13 1055)   TDaP vaccine 04/28/2018  Flu Shot: Declines HIV: Non Reactive (09/13 1055)   Baby Food                                 [id number]  Contraception  Pap: 10/2017, NILM per patient report  CBB     CS/VBAC N/A   Support Person             Past Surgical History:  Past Surgical History:  Procedure Laterality Date  . CESAREAN SECTION N/A 06/12/2018   Procedure: CESAREAN SECTION;  Surgeon: Will Bonnet, MD;  Location: [facility] ORS;  Service: Obstetrics;  Laterality: N/A;  . WISDOM TOOTH EXTRACTION      Gynecologic History: Patient's last menstrual period was 10/23/2019.  Obstetric History: G1P1001  Family History:  Family History  Problem Relation Age of Onset  . Epilepsy Mother   . Epilepsy Father     Social History:  Social History   Socioeconomic History  . Marital status: Single    Spouse name: Larkin Ina  . Number of children: Not on file  . Years of education: Not on file  . Highest education level: Not on file  Occupational History  . Not on file  Tobacco Use  . Smoking status: Never Smoker  . Smokeless tobacco: Never Used  Vaping Use  . Vaping Use: Never used  Substance and Sexual Activity  . Alcohol use: No  . Drug use: No  . Sexual activity: Yes    Partners: Male    Comment: undecided  Other Topics Concern  . Not on file  Social History Narrative  . Not on file   Social Determinants of Health   Financial Resource Strain:   . Difficulty of Paying Living Expenses: Not on  file  Food Insecurity:   . Worried About Charity fundraiser in the Last Year: Not on file  . Ran Out of Food in the Last Year: Not on file  Transportation Needs:   . Lack of Transportation (Medical): Not on file  . Lack of Transportation (Non-Medical): Not on file  Physical Activity:   . Days of Exercise per Week: Not on file  . Minutes of Exercise per Session: Not on file  Stress:   . Feeling of Stress : Not on file  Social Connections:   . Frequency of Communication with Friends and Family: Not on file  . Frequency of Social Gatherings with Friends and Family: Not on file  . Attends  Religious Services: Not on file  . Active Member of Clubs or Organizations: Not on file  . Attends Archivist Meetings: Not on file  . Marital Status: Not on file  Intimate Partner Violence:   . Fear of Current or Ex-Partner: Not on file  . Emotionally Abused: Not on file  . Physically Abused: Not on file  . Sexually Abused: Not on file    Allergies:  Allergies  Allergen Reactions  . Ciprofloxacin Hcl Hives  . Sulfa Antibiotics     Medications: Prior to Admission medications   Medication Sig Start Date End Date Taking? Authorizing Provider  FLOVENT HFA 220 MCG/ACT inhaler Inhale 1 puff into the lungs 2 (two) times daily. 06/28/18  Yes Will Bonnet, MD  ISOtretinoin (ACCUTANE) 40 MG capsule Take by mouth. 07/14/19  Yes [provider]  PROAIR HFA 108 (90 Base) MCG/ACT inhaler Inhale 2 puffs into the lungs every 4 (four) hours as needed. 06/28/18  Yes Will Bonnet, MD  tretinoin (RETIN-A) 0.025 % cream Apply to face nightly (avoid eyelids; start twice weekly and increase by 1 night weekly as tolerated) 04/26/19 02/22/20 Yes [provider]    Physical Exam Blood pressure 106/78, pulse 94, height 4\' 11"  (1.499 m), weight 140 lb (63.5 kg), last menstrual period 10/23/2019, not currently breastfeeding. Patient's last menstrual period was 10/23/2019. Body mass index is 28.28 kg/m.  General: NAD HEENT: normocephalic, anicteric Thyroid: no enlargement Pulmonary: no increased work of breathing Abdomen: soft, non-tender, non-distended, small 8.7GO umbilical hernia Neurologic: Grossly intact Psychiatric: mood appropriate, affect full  Assessment: 24 y.o. G1P1001 presenting for discussion of weight loss management options  Plan: Problem List Items Addressed This Visit    None    Visit Diagnoses    Umbilical hernia without obstruction and without gangrene    -  Primary   Relevant Orders   Ambulatory referral to General Surgery   Overweight (BMI  25.0-29.9)       Relevant Orders   Thyroid Panel With TSH   Hemoglobin A1c   Lipid panel   BMI 28.0-28.9,adult       Relevant Orders   Thyroid Panel With TSH   Hemoglobin A1c   Lipid panel   Thyroid disorder screening       Relevant Orders   Thyroid Panel With TSH   Screening for diabetes mellitus       Relevant Orders   Hemoglobin A1c   Lipid screening       Relevant Orders   Lipid panel      1) 1500 Calorie ADA Diet  2) Patient education given regarding appropriate lifestyle changes for weight loss including: regular physical activity, healthy coping strategies, caloric restriction and healthy eating patterns.  3) Patient will be started  on weight loss medication. The risks and benefits and side effects of medication, such as Adipex (Phenteramine) ,  Tenuate (Diethylproprion), Belviq (lorcarsin), Contrave (buproprion/naltrexone), Qsymia (phentermine/topiramate), and Saxenda (liraglutide) is discussed. The pros and cons of suppressing appetite and boosting metabolism is discussed. Risks of tolerence and addiction is discussed for selected agents discussed. Use of medicine will ne short term, such as 3-4 months at a time followed by a period of time off of the medicine to avoid these risks and side effects for Adipex, Qsymia, and Tenuate discussed. Pt to call with any negative side effects and agrees to keep follow up appts.  4) Comorbidity Screening - hypothyroidism screening, diabetes, and hyperlipidemia screening offered  5) Encouraged weekly weight monitorig to track progress and sample 1 week food diary  6) Umbilical hernia - referral to general surgery  7) 15 minutes face-to-face; counseling/coordination of care > 50 percent of visit  8) No follow-ups on file.   Malachy Mood, MD, [facility], [facility] 11/19/2019, 10:40 AM

## 2019-11-20 ENCOUNTER — Ambulatory Visit (INDEPENDENT_AMBULATORY_CARE_PROVIDER_SITE_OTHER): Payer: Medicaid Other | Admitting: Surgery

## 2019-11-20 ENCOUNTER — Encounter: Payer: Self-pay | Admitting: Surgery

## 2019-11-20 ENCOUNTER — Ambulatory Visit: Payer: Self-pay | Admitting: Surgery

## 2019-11-20 VITALS — BP 116/79 | HR 89 | Temp 99.9°F | Ht 59.0 in | Wt 141.4 lb

## 2019-11-20 DIAGNOSIS — K429 Umbilical hernia without obstruction or gangrene: Secondary | ICD-10-CM | POA: Diagnosis not present

## 2019-11-20 LAB — THYROID PANEL WITH TSH
Free Thyroxine Index: 1.9 (ref 1.2–4.9)
T3 Uptake Ratio: 28 % (ref 24–39)
T4, Total: 6.7 ug/dL (ref 4.5–12.0)
TSH: 0.896 u[IU]/mL (ref 0.450–4.500)

## 2019-11-20 NOTE — H&P (View-Only) (Signed)
Patient ID: Tamara Flynn, female   DOB: 06/02/1995, 24 y.o.   MRN: [id number]  Chief Complaint: Umbilical pain  History of Present Illness Tamara Flynn is a 24 y.o. female with umbilical hernia.  Patient notes having pain in the periumbilical area with associated bulge.  Activity seems to exacerbate the pain.  There are times when it is associated with nausea.  She also senses a pulling sensation.  This has been going on for about a year.  It is almost constantly bulging out.  She denies any fevers, chills, or vomiting.  She notes some increased bulging with bowel movements.  She is uncertain as to her having additional children and would like to keep options open.  Past Medical History Past Medical History:  Diagnosis Date  . Abdominal pain during pregnancy in second trimester 03/13/2018  . Asthma       Past Surgical History:  Procedure Laterality Date  . CESAREAN SECTION N/A 06/12/2018   Procedure: CESAREAN SECTION;  Surgeon: Will Bonnet, MD;  Location: [facility] ORS;  Service: Obstetrics;  Laterality: N/A;  . WISDOM TOOTH EXTRACTION      Allergies  Allergen Reactions  . Sulfamethoxazole-Trimethoprim Hives  . Ciprofloxacin Hcl Hives  . Sulfa Antibiotics     Current Outpatient Medications  Medication Sig Dispense Refill  . Adapalene-Benzoyl Peroxide (EPIDUO FORTE) 0.3-2.5 % GEL Apply topically to face at night    . FLOVENT HFA 220 MCG/ACT inhaler Inhale 1 puff into the lungs 2 (two) times daily. 1 Inhaler 0  . phentermine (ADIPEX-P) 37.5 MG tablet Take 1 tablet (37.5 mg total) by mouth daily before breakfast. 30 tablet 0  . PROAIR HFA 108 (90 Base) MCG/ACT inhaler Inhale 2 puffs into the lungs every 4 (four) hours as needed. 1 Inhaler 0  . spironolactone (ALDACTONE) 50 MG tablet Take 1 tablet by mouth daily.    Marland Kitchen acyclovir (ZOVIRAX) 400 MG tablet Take 400 mg by mouth 3 (three) times daily as needed. (Patient not taking: Reported on 11/20/2019)    . AFTERA 1.5 MG tablet  SMARTSIG:1 By Mouth (Patient not taking: Reported on 11/20/2019)    . ISOtretinoin (ACCUTANE) 40 MG capsule Take by mouth. (Patient not taking: Reported on 11/20/2019)    . omeprazole (PRILOSEC) 20 MG capsule Take 20 mg by mouth daily. (Patient not taking: Reported on 11/20/2019)    . tretinoin (RETIN-A) 0.025 % cream Apply to face nightly (avoid eyelids; start twice weekly and increase by 1 night weekly as tolerated) (Patient not taking: Reported on 11/20/2019)     No current facility-administered medications for this visit.    Family History Family History  Problem Relation Age of Onset  . Epilepsy Mother   . Epilepsy Father       Social History Social History   Tobacco Use  . Smoking status: Never Smoker  . Smokeless tobacco: Never Used  Vaping Use  . Vaping Use: Never used  Substance Use Topics  . Alcohol use: No  . Drug use: No        Review of Systems  All other systems reviewed and are negative.     Physical Exam Blood pressure 116/79, pulse 89, temperature 99.9 F (37.7 C), temperature source Oral, height 4\' 11"  (1.499 m), weight 141 lb 6.4 oz (64.1 kg), last menstrual period 10/23/2019, SpO2 (!) 85 %, not currently breastfeeding. Last Weight  Most recent update: 11/20/2019  1:35 PM   Weight  64.1 kg (141 lb 6.4 oz)  CONSTITUTIONAL: Well developed, and nourished, appropriately responsive and aware without distress.  Healthy and athletic appearing. EYES: Sclera non-icteric.   EARS, NOSE, MOUTH AND THROAT: Mask worn.   Hearing is intact to voice.  NECK: Trachea is midline, and there is no jugular venous distension.  LYMPH NODES:  Lymph nodes in the neck are not enlarged. RESPIRATORY:  Lungs are clear, and breath sounds are equal bilaterally. Normal respiratory effort without pathologic use of accessory muscles. CARDIOVASCULAR: Heart is regular in rate and rhythm. GI: The abdomen is soft, nontender, and nondistended. There were no palpable masses. I did  not appreciate hepatosplenomegaly. There were normal bowel sounds.  There is a readily reducible 1 cm umbilical hernia fascial defect.  She did note some tenderness cephalad to this area, but I did not notice any palpable fascial defect, there is a scar from an umbilical piercing.  I do not appreciate any diastases recti. MUSCULOSKELETAL:  Symmetrical muscle tone appreciated in all four extremities.    SKIN: Skin turgor is normal. No pathologic skin lesions appreciated.  NEUROLOGIC:  Motor and sensation appear grossly normal.  Cranial nerves are grossly without defect. PSYCH:  Alert and oriented to person, place and time. Affect is appropriate for situation.  Data Reviewed I have personally reviewed what is currently available of the patient's imaging, recent labs and medical records.   Labs:  CBC Latest Ref Rng & Units 06/20/2018 06/19/2018 06/13/2018  WBC 4.0 - 10.5 K/uL 10.6(H) 11.3(H) 13.5(H)  Hemoglobin 12.0 - 15.0 g/dL 10.2(L) 11.4(L) 9.4(L)  Hematocrit 36 - 46 % 32.0(L) 35.4(L) 28.3(L)  Platelets 150 - 400 K/uL 509(H) 519(H) 141(L)   CMP Latest Ref Rng & Units 06/28/2018 06/21/2018 06/20/2018  Glucose 70 - 99 mg/dL - - -  BUN 6 - 20 mg/dL - - -  Creatinine 0.57 - 1.00 mg/dL 1.00 0.84 0.84  Sodium 135 - 145 mmol/L - - -  Potassium 3.5 - 5.1 mmol/L - - -  Chloride 98 - 111 mmol/L - - -  CO2 22 - 32 mmol/L - - -  Calcium 8.9 - 10.3 mg/dL - - -  Total Protein 6.5 - 8.1 g/dL - 6.7 6.9  Total Bilirubin 0.3 - 1.2 mg/dL - 0.3 <0.1(L)  Alkaline Phos 38 - 126 U/L - 176(H) 189(H)  AST 15 - 41 U/L - 37 45(H)  ALT 0 - 44 U/L - 49(H) 53(H)      Imaging:  Within last 24 hrs: No results found.  Assessment    Umbilical hernia. Patient Active Problem List   Diagnosis Date Noted  . Preeclampsia in postpartum period 06/19/2018  . Hypertension in pregnancy, preeclampsia, severe, delivered/postpartum 06/13/2018  . Delivery of pregnancy by cesarean section 06/13/2018  . Failed induction of  labor 06/13/2018  . Fetal intolerance to labor, delivered, current hospitalization 06/13/2018  . Gestational hypertension 06/07/2018  . Polyhydramnios affecting pregnancy 04/28/2018  . Supervision of high risk pregnancy, antepartum 11/18/2017    Plan    We discussed umbilical repair options.  Considering the size of the fascial defect I do not feel any mesh is necessary or worthwhile to diminish her risk of recurrence.  There is a degree of abdominal wall laxity likely secondary from her prior pregnancy. I recommended we do a primary repair, open and avoid use of mesh. I discussed possibility of enlargement in size over time.  Also discussed the risk of surgery including recurrence which can be up to 30% in the case of midline hernias, use  of prosthetic materials (mesh) and the increased risk of infxn, post-op infxn and the possible need for re-operation and removal of mesh if used, how mesh might behave during a future pregnancy, and the risks of general anesthetic including MI, CVA, sudden death or even reaction to anesthetic medications. The patient and family understands the risks, any and all questions were answered to the patient's satisfaction.  Face-to-face time spent with the patient and accompanying care providers(if present) was 30 minutes, with more than 50% of the time spent counseling, educating, and coordinating care of the patient.      Ronny Bacon M.D., FACS 11/20/2019, 2:31 PM

## 2019-11-20 NOTE — Progress Notes (Signed)
Patient ID: Tamara Flynn, female   DOB: 1995-08-01, 24 y.o.   MRN: [id number]  Chief Complaint: Umbilical pain  History of Present Illness Tamara Flynn is a 24 y.o. female with umbilical hernia.  Patient notes having pain in the periumbilical area with associated bulge.  Activity seems to exacerbate the pain.  There are times when it is associated with nausea.  She also senses a pulling sensation.  This has been going on for about a year.  It is almost constantly bulging out.  She denies any fevers, chills, or vomiting.  She notes some increased bulging with bowel movements.  She is uncertain as to her having additional children and would like to keep options open.  Past Medical History Past Medical History:  Diagnosis Date  . Abdominal pain during pregnancy in second trimester 03/13/2018  . Asthma       Past Surgical History:  Procedure Laterality Date  . CESAREAN SECTION N/A 06/12/2018   Procedure: CESAREAN SECTION;  Surgeon: Will Bonnet, MD;  Location: [facility] ORS;  Service: Obstetrics;  Laterality: N/A;  . WISDOM TOOTH EXTRACTION      Allergies  Allergen Reactions  . Sulfamethoxazole-Trimethoprim Hives  . Ciprofloxacin Hcl Hives  . Sulfa Antibiotics     Current Outpatient Medications  Medication Sig Dispense Refill  . Adapalene-Benzoyl Peroxide (EPIDUO FORTE) 0.3-2.5 % GEL Apply topically to face at night    . FLOVENT HFA 220 MCG/ACT inhaler Inhale 1 puff into the lungs 2 (two) times daily. 1 Inhaler 0  . phentermine (ADIPEX-P) 37.5 MG tablet Take 1 tablet (37.5 mg total) by mouth daily before breakfast. 30 tablet 0  . PROAIR HFA 108 (90 Base) MCG/ACT inhaler Inhale 2 puffs into the lungs every 4 (four) hours as needed. 1 Inhaler 0  . spironolactone (ALDACTONE) 50 MG tablet Take 1 tablet by mouth daily.    Marland Kitchen acyclovir (ZOVIRAX) 400 MG tablet Take 400 mg by mouth 3 (three) times daily as needed. (Patient not taking: Reported on 11/20/2019)    . AFTERA 1.5 MG tablet  SMARTSIG:1 By Mouth (Patient not taking: Reported on 11/20/2019)    . ISOtretinoin (ACCUTANE) 40 MG capsule Take by mouth. (Patient not taking: Reported on 11/20/2019)    . omeprazole (PRILOSEC) 20 MG capsule Take 20 mg by mouth daily. (Patient not taking: Reported on 11/20/2019)    . tretinoin (RETIN-A) 0.025 % cream Apply to face nightly (avoid eyelids; start twice weekly and increase by 1 night weekly as tolerated) (Patient not taking: Reported on 11/20/2019)     No current facility-administered medications for this visit.    Family History Family History  Problem Relation Age of Onset  . Epilepsy Mother   . Epilepsy Father       Social History Social History   Tobacco Use  . Smoking status: Never Smoker  . Smokeless tobacco: Never Used  Vaping Use  . Vaping Use: Never used  Substance Use Topics  . Alcohol use: No  . Drug use: No        Review of Systems  All other systems reviewed and are negative.     Physical Exam Blood pressure 116/79, pulse 89, temperature 99.9 F (37.7 C), temperature source Oral, height 4\' 11"  (1.499 m), weight 141 lb 6.4 oz (64.1 kg), last menstrual period 10/23/2019, SpO2 (!) 85 %, not currently breastfeeding. Last Weight  Most recent update: 11/20/2019  1:35 PM   Weight  64.1 kg (141 lb 6.4 oz)  CONSTITUTIONAL: Well developed, and nourished, appropriately responsive and aware without distress.  Healthy and athletic appearing. EYES: Sclera non-icteric.   EARS, NOSE, MOUTH AND THROAT: Mask worn.   Hearing is intact to voice.  NECK: Trachea is midline, and there is no jugular venous distension.  LYMPH NODES:  Lymph nodes in the neck are not enlarged. RESPIRATORY:  Lungs are clear, and breath sounds are equal bilaterally. Normal respiratory effort without pathologic use of accessory muscles. CARDIOVASCULAR: Heart is regular in rate and rhythm. GI: The abdomen is soft, nontender, and nondistended. There were no palpable masses. I did  not appreciate hepatosplenomegaly. There were normal bowel sounds.  There is a readily reducible 1 cm umbilical hernia fascial defect.  She did note some tenderness cephalad to this area, but I did not notice any palpable fascial defect, there is a scar from an umbilical piercing.  I do not appreciate any diastases recti. MUSCULOSKELETAL:  Symmetrical muscle tone appreciated in all four extremities.    SKIN: Skin turgor is normal. No pathologic skin lesions appreciated.  NEUROLOGIC:  Motor and sensation appear grossly normal.  Cranial nerves are grossly without defect. PSYCH:  Alert and oriented to person, place and time. Affect is appropriate for situation.  Data Reviewed I have personally reviewed what is currently available of the patient's imaging, recent labs and medical records.   Labs:  CBC Latest Ref Rng & Units 06/20/2018 06/19/2018 06/13/2018  WBC 4.0 - 10.5 K/uL 10.6(H) 11.3(H) 13.5(H)  Hemoglobin 12.0 - 15.0 g/dL 10.2(L) 11.4(L) 9.4(L)  Hematocrit 36 - 46 % 32.0(L) 35.4(L) 28.3(L)  Platelets 150 - 400 K/uL 509(H) 519(H) 141(L)   CMP Latest Ref Rng & Units 06/28/2018 06/21/2018 06/20/2018  Glucose 70 - 99 mg/dL - - -  BUN 6 - 20 mg/dL - - -  Creatinine 0.57 - 1.00 mg/dL 1.00 0.84 0.84  Sodium 135 - 145 mmol/L - - -  Potassium 3.5 - 5.1 mmol/L - - -  Chloride 98 - 111 mmol/L - - -  CO2 22 - 32 mmol/L - - -  Calcium 8.9 - 10.3 mg/dL - - -  Total Protein 6.5 - 8.1 g/dL - 6.7 6.9  Total Bilirubin 0.3 - 1.2 mg/dL - 0.3 <0.1(L)  Alkaline Phos 38 - 126 U/L - 176(H) 189(H)  AST 15 - 41 U/L - 37 45(H)  ALT 0 - 44 U/L - 49(H) 53(H)      Imaging:  Within last 24 hrs: No results found.  Assessment    Umbilical hernia. Patient Active Problem List   Diagnosis Date Noted  . Preeclampsia in postpartum period 06/19/2018  . Hypertension in pregnancy, preeclampsia, severe, delivered/postpartum 06/13/2018  . Delivery of pregnancy by cesarean section 06/13/2018  . Failed induction of  labor 06/13/2018  . Fetal intolerance to labor, delivered, current hospitalization 06/13/2018  . Gestational hypertension 06/07/2018  . Polyhydramnios affecting pregnancy 04/28/2018  . Supervision of high risk pregnancy, antepartum 11/18/2017    Plan    We discussed umbilical repair options.  Considering the size of the fascial defect I do not feel any mesh is necessary or worthwhile to diminish her risk of recurrence.  There is a degree of abdominal wall laxity likely secondary from her prior pregnancy. I recommended we do a primary repair, open and avoid use of mesh. I discussed possibility of enlargement in size over time.  Also discussed the risk of surgery including recurrence which can be up to 30% in the case of midline hernias, use  of prosthetic materials (mesh) and the increased risk of infxn, post-op infxn and the possible need for re-operation and removal of mesh if used, how mesh might behave during a future pregnancy, and the risks of general anesthetic including MI, CVA, sudden death or even reaction to anesthetic medications. The patient and family understands the risks, any and all questions were answered to the patient's satisfaction.  Face-to-face time spent with the patient and accompanying care providers(if present) was 30 minutes, with more than 50% of the time spent counseling, educating, and coordinating care of the patient.      Ronny Bacon M.D., FACS 11/20/2019, 2:31 PM

## 2019-11-20 NOTE — Patient Instructions (Addendum)
Our surgery scheduler Pamala Hurry will contact you within the next 24-48 hours to get scheduled. During that call, she will discuss the preparation prior to surgery and the different dates and times for surgery. Please have the BLUE sheet available when she contacts you. If you have any questions regarding the surgery, please do not hesitate to give our office a call.   Umbilical Hernia, Adult  A hernia is a bulge of tissue that pushes through an opening between muscles. An umbilical hernia happens in the abdomen, near the belly button (umbilicus). The hernia may contain tissues from the small intestine, large intestine, or fatty tissue covering the intestines (omentum). Umbilical hernias in adults tend to get worse over time, and they require surgical treatment. There are several types of umbilical hernias. You may have:  A hernia located just above or below the umbilicus (indirect hernia). This is the most common type of umbilical hernia in adults.  A hernia that forms through an opening formed by the umbilicus (direct hernia).  A hernia that comes and goes (reducible hernia). A reducible hernia may be visible only when you strain, lift something heavy, or cough. This type of hernia can be pushed back into the abdomen (reduced).  A hernia that traps abdominal tissue inside the hernia (incarcerated hernia). This type of hernia cannot be reduced.  A hernia that cuts off blood flow to the tissues inside the hernia (strangulated hernia). The tissues can start to die if this happens. This type of hernia requires emergency treatment. What are the causes? An umbilical hernia happens when tissue inside the abdomen presses on a weak area of the abdominal muscles. What increases the risk? You may have a greater risk of this condition if you:  Are obese.  Have had several pregnancies.  Have a buildup of fluid inside your abdomen (ascites).  Have had surgery that weakens the abdominal muscles. What are  the signs or symptoms? The main symptom of this condition is a painless bulge at or near the belly button. A reducible hernia may be visible only when you strain, lift something heavy, or cough. Other symptoms may include:  Dull pain.  A feeling of pressure. Symptoms of a strangulated hernia may include:  Pain that gets increasingly worse.  Nausea and vomiting.  Pain when pressing on the hernia.  Skin over the hernia becoming red or purple.  Constipation.  Blood in the stool. How is this diagnosed? This condition may be diagnosed based on:  A physical exam. You may be asked to cough or strain while standing. These actions increase the pressure inside your abdomen and force the hernia through the opening in your muscles. Your health care provider may try to reduce the hernia by pressing on it.  Your symptoms and medical history. How is this treated? Surgery is the only treatment for an umbilical hernia. Surgery for a strangulated hernia is done as soon as possible. If you have a small hernia that is not incarcerated, you may need to lose weight before having surgery. Follow these instructions at home:  Lose weight, if told by your health care provider.  Do not try to push the hernia back in.  Watch your hernia for any changes in color or size. Tell your health care provider if any changes occur.  You may need to avoid activities that increase pressure on your hernia.  Do not lift anything that is heavier than 10 lb (4.5 kg) until your health care provider says that this  is safe.  Take over-the-counter and prescription medicines only as told by your health care provider.  Keep all follow-up visits as told by your health care provider. This is important. Contact a health care provider if:  Your hernia gets larger.  Your hernia becomes painful. Get help right away if:  You develop sudden, severe pain near the area of your hernia.  You have pain as well as nausea or  vomiting.  You have pain and the skin over your hernia changes color.  You develop a fever. This information is not intended to replace advice given to you by your health care provider. Make sure you discuss any questions you have with your health care provider. Document Revised: 04/06/2017 Document Reviewed: 08/23/2016 Elsevier Patient Education  [place].

## 2019-11-21 ENCOUNTER — Telehealth: Payer: Self-pay | Admitting: Surgery

## 2019-11-21 NOTE — Telephone Encounter (Signed)
Patient has been advised of Pre-Admission date/time, COVID Testing date and Surgery date.  Surgery Date: 11/30/19 Preadmission Testing Date: 11/27/19 (phone 8a-1p) Covid Testing Date: 11/28/19 - patient advised to go to the [facility] between 8a-1p   Patient has been made aware to call [phone], between 1-3:00pm the day before surgery, to find out what time to arrive for surgery.

## 2019-11-27 ENCOUNTER — Other Ambulatory Visit: Payer: Self-pay

## 2019-11-27 ENCOUNTER — Other Ambulatory Visit
Admission: RE | Admit: 2019-11-27 | Discharge: 2019-11-27 | Disposition: A | Discharge status: Home / Self Care | Payer: Medicaid Other | Source: Ambulatory Visit | Attending: Surgery | Admitting: Surgery

## 2019-11-27 DIAGNOSIS — Z01812 Encounter for preprocedural laboratory examination: Secondary | ICD-10-CM | POA: Insufficient documentation

## 2019-11-27 HISTORY — DX: Gastro-esophageal reflux disease without esophagitis: K21.9

## 2019-11-27 HISTORY — DX: Essential (primary) hypertension: I10

## 2019-11-27 NOTE — Patient Instructions (Signed)
Your procedure is scheduled on: Friday 9/24  Report to Day Surgery. To find out your arrival time please call [phone] between 1PM - 3PM on .thurs. 9/23  Remember: Instructions that are not followed completely may result in serious medical risk,  up to and including death, or upon the discretion of your surgeon and anesthesiologist your  surgery may need to be rescheduled.     _X__ 1. Do not eat food after midnight the night before your procedure.                 No chewing gum or hard candies. You may drink clear liquids up to 2 hours                 before you are scheduled to arrive for your surgery- DO not drink clear                 liquids within 2 hours of the start of your surgery.                 Clear Liquids include:  water, apple juice without pulp, clear Gatorade, G2 or                  Gatorade Zero (avoid Red/Purple/Blue), Black Coffee or Tea (Do not add                 anything to coffee or tea). _____2.   Complete the "Ensure Clear Pre-surgery Clear Carbohydrate Drink" provided to you, 2 hours before arrival. **If you       are diabetic you will be provided with an alternative drink, Gatorade Zero or G2.  __X__2.  On the morning of surgery brush your teeth with toothpaste and water, you                may rinse your mouth with mouthwash if you wish.  Do not swallow any toothpaste of mouthwash.     ___ 3.  No Alcohol for 24 hours before or after surgery.   __ 4.  Do Not Smoke or use e-cigarettes For 24 Hours Prior to Your Surgery.                 Do not use any chewable tobacco products for at least 6 hours prior to                 Surgery.  __  5.  Do not use any recreational drugs (marijuana, cocaine, heroin, ecstasy, MDMA or other)                For at least one week prior to your surgery.  Combination of these drugs with anesthesia                May have life threatening results.  ____  6.  Bring all medications with you on the day of  surgery if instructed.   _x___  7.  Notify your doctor if there is any change in your medical condition      (cold, fever, infections).     Do not wear jewelry, make-up, hairpins, clips or nail polish. Do not wear lotions, powders, or perfumes. You may wear deodorant. Do not shave 48 hours prior to surgery. Do not bring valuables to the hospital.    [facility] is not responsible for any belongings or valuables.  Contacts, dentures or bridgework may not be worn into surgery. Leave your suitcase in the car. After  surgery it may be brought to your room. For patients admitted to the hospital, discharge time is determined by your treatment team.   Patients discharged the day of surgery will not be allowed to drive home.   Make arrangements for someone to be with you for the first 24 hours of your Same Day Discharge.    Please read over the following fact sheets that you were given:    ____ Take these medicines the morning of surgery with A SIP OF WATER:    1. none  2.   3.   4.  5.  6.  ____ Fleet Enema (as directed)   _x___ Use CHG Soap (or wipes) as directed  ____ Use Benzoyl Peroxide Gel as instructed  __x__ Use inhalers on the day of surgery FLOVENT HFA 220 MCG/ACT inhaler, PROAIR HFA 108 (90 Base) MCG/ACT inhaler and bring with you  ____ Stop metformin 2 days prior to surgery    ____ Take 1/2 of usual insulin dose the night before surgery. No insulin the morning          of surgery.   ____ Stop Coumadin/Plavix/aspirin on   __x__ Stop Anti-inflammatories    May take tylenol   _x___ Stop supplements until after surgery.  Biotin   ____ Bring C-Pap to the hospital.    If you have any questions regarding your pre-procedure instructions,  Please call Pre-admit Testing at [phone]

## 2019-11-28 ENCOUNTER — Other Ambulatory Visit
Admission: RE | Admit: 2019-11-28 | Discharge: 2019-11-28 | Disposition: A | Discharge status: Home / Self Care | Payer: Medicaid Other | Source: Ambulatory Visit | Attending: Surgery | Admitting: Surgery

## 2019-11-28 DIAGNOSIS — Z20822 Contact with and (suspected) exposure to covid-19: Secondary | ICD-10-CM | POA: Diagnosis not present

## 2019-11-28 DIAGNOSIS — Z01812 Encounter for preprocedural laboratory examination: Secondary | ICD-10-CM | POA: Diagnosis present

## 2019-11-28 LAB — COMPREHENSIVE METABOLIC PANEL
ALT: 20 U/L (ref 0–44)
AST: 18 U/L (ref 15–41)
Albumin: 4.3 g/dL (ref 3.5–5.0)
Alkaline Phosphatase: 101 U/L (ref 38–126)
Anion gap: 8 (ref 5–15)
BUN: 10 mg/dL (ref 6–20)
CO2: 25 mmol/L (ref 22–32)
Calcium: 9.2 mg/dL (ref 8.9–10.3)
Chloride: 106 mmol/L (ref 98–111)
Creatinine, Ser: 0.92 mg/dL (ref 0.44–1.00)
GFR calc Af Amer: >60 mL/min (ref >60)
GFR calc non Af Amer: >60 mL/min (ref >60)
Glucose, Bld: 89 mg/dL (ref 70–99)
Potassium: 3.8 mmol/L (ref 3.5–5.1)
Sodium: 139 mmol/L (ref 135–145)
Total Bilirubin: 1.2 mg/dL (ref 0.3–1.2)
Total Protein: 7.6 g/dL (ref 6.5–8.1)

## 2019-11-28 LAB — CBC WITH DIFFERENTIAL/PLATELET
Abs Immature Granulocytes: 0.03 10*3/uL (ref 0.00–0.07)
Basophils Absolute: 0.1 10*3/uL (ref 0.0–0.1)
Basophils Relative: 1 %
Eosinophils Absolute: 0.1 10*3/uL (ref 0.0–0.5)
Eosinophils Relative: 1 %
HCT: 42.6 % (ref 36.0–46.0)
Hemoglobin: 14.3 g/dL (ref 12.0–15.0)
Immature Granulocytes: 0 %
Lymphocytes Relative: 24 %
Lymphs Abs: 2.3 10*3/uL (ref 0.7–4.0)
MCH: 29.7 pg (ref 26.0–34.0)
MCHC: 33.6 g/dL (ref 30.0–36.0)
MCV: 88.6 fL (ref 80.0–100.0)
Monocytes Absolute: 0.6 10*3/uL (ref 0.1–1.0)
Monocytes Relative: 6 %
Neutro Abs: 6.6 10*3/uL (ref 1.7–7.7)
Neutrophils Relative %: 68 %
Platelets: 270 10*3/uL (ref 150–400)
RBC: 4.81 MIL/uL (ref 3.87–5.11)
RDW: 12.5 % (ref 11.5–15.5)
WBC: 9.7 10*3/uL (ref 4.0–10.5)
nRBC: 0 % (ref 0.0–0.2)

## 2019-11-28 LAB — SARS CORONAVIRUS 2 (TAT 6-24 HRS): SARS Coronavirus 2: NEGATIVE

## 2019-11-30 ENCOUNTER — Ambulatory Visit: Payer: Medicaid Other | Admitting: Registered Nurse

## 2019-11-30 ENCOUNTER — Encounter
Admission: RE | Disposition: A | Discharge status: Home / Self Care | Payer: Self-pay | Source: Home / Self Care | Attending: Surgery

## 2019-11-30 ENCOUNTER — Ambulatory Visit
Admission: RE | Admit: 2019-11-30 | Discharge: 2019-11-30 | Disposition: A | Discharge status: Home / Self Care | Payer: Medicaid Other | Attending: Surgery | Admitting: Surgery

## 2019-11-30 ENCOUNTER — Encounter: Payer: Self-pay | Admitting: Surgery

## 2019-11-30 ENCOUNTER — Other Ambulatory Visit: Payer: Self-pay

## 2019-11-30 DIAGNOSIS — Z79899 Other long term (current) drug therapy: Secondary | ICD-10-CM | POA: Diagnosis not present

## 2019-11-30 DIAGNOSIS — K429 Umbilical hernia without obstruction or gangrene: Secondary | ICD-10-CM | POA: Insufficient documentation

## 2019-11-30 DIAGNOSIS — I10 Essential (primary) hypertension: Secondary | ICD-10-CM | POA: Insufficient documentation

## 2019-11-30 DIAGNOSIS — J45909 Unspecified asthma, uncomplicated: Secondary | ICD-10-CM | POA: Insufficient documentation

## 2019-11-30 DIAGNOSIS — Z7951 Long term (current) use of inhaled steroids: Secondary | ICD-10-CM | POA: Diagnosis not present

## 2019-11-30 HISTORY — PX: UMBILICAL HERNIA REPAIR: SHX196

## 2019-11-30 LAB — POCT PREGNANCY, URINE: Preg Test, Ur: NEGATIVE

## 2019-11-30 SURGERY — REPAIR, HERNIA, UMBILICAL, ADULT
Anesthesia: General

## 2019-11-30 MED ORDER — CEFAZOLIN SODIUM-DEXTROSE 2-4 GM/100ML-% IV SOLN: 2.0000 g | INTRAVENOUS | Status: DC

## 2019-11-30 MED ORDER — BUPIVACAINE LIPOSOME 1.3 % IJ SUSP: 20.0000 mL | Freq: Once | INTRAMUSCULAR | Status: DC

## 2019-11-30 MED ORDER — GLYCOPYRROLATE 0.2 MG/ML IJ SOLN
INTRAMUSCULAR | Status: AC
  Filled 2019-11-30: qty 1

## 2019-11-30 MED ORDER — ACETAMINOPHEN 500 MG PO TABS: 1000.0000 mg | ORAL_TABLET | ORAL | Status: AC

## 2019-11-30 MED ORDER — ROCURONIUM BROMIDE 10 MG/ML (PF) SYRINGE
PREFILLED_SYRINGE | INTRAVENOUS | Status: AC
  Filled 2019-11-30: qty 10

## 2019-11-30 MED ORDER — LIDOCAINE HCL (CARDIAC) PF 100 MG/5ML IV SOSY
PREFILLED_SYRINGE | INTRAVENOUS | Status: DC | PRN
  Administered 2019-11-30: 80 mg via INTRAVENOUS

## 2019-11-30 MED ORDER — DEXAMETHASONE SODIUM PHOSPHATE 10 MG/ML IJ SOLN
INTRAMUSCULAR | Status: AC
  Filled 2019-11-30: qty 1

## 2019-11-30 MED ORDER — ONDANSETRON HCL 4 MG/2ML IJ SOLN
INTRAMUSCULAR | Status: DC | PRN
  Administered 2019-11-30: 4 mg via INTRAVENOUS

## 2019-11-30 MED ORDER — FAMOTIDINE 20 MG PO TABS
ORAL_TABLET | ORAL | Status: AC
  Filled 2019-11-30: qty 1

## 2019-11-30 MED ORDER — GLYCOPYRROLATE 0.2 MG/ML IJ SOLN
INTRAMUSCULAR | Status: DC | PRN
  Administered 2019-11-30: .2 mg via INTRAVENOUS

## 2019-11-30 MED ORDER — GABAPENTIN 300 MG PO CAPS: 300.0000 mg | ORAL_CAPSULE | ORAL | Status: AC

## 2019-11-30 MED ORDER — MIDAZOLAM HCL 2 MG/2ML IJ SOLN
INTRAMUSCULAR | Status: AC
  Filled 2019-11-30: qty 2

## 2019-11-30 MED ORDER — CHLORHEXIDINE GLUCONATE 0.12 % MT SOLN
OROMUCOSAL | Status: AC
  Administered 2019-11-30: 15 mL via OROMUCOSAL
  Filled 2019-11-30: qty 15

## 2019-11-30 MED ORDER — ONDANSETRON HCL 4 MG/2ML IJ SOLN
INTRAMUSCULAR | Status: AC
  Filled 2019-11-30: qty 2

## 2019-11-30 MED ORDER — SUGAMMADEX SODIUM 200 MG/2ML IV SOLN
INTRAVENOUS | Status: DC | PRN
  Administered 2019-11-30: 241.2 mg via INTRAVENOUS

## 2019-11-30 MED ORDER — ACETAMINOPHEN 500 MG PO TABS
ORAL_TABLET | ORAL | Status: AC
  Administered 2019-11-30: 1000 mg via ORAL
  Filled 2019-11-30: qty 2

## 2019-11-30 MED ORDER — FAMOTIDINE 20 MG PO TABS
ORAL_TABLET | ORAL | Status: AC
  Filled 2019-11-30: qty 2

## 2019-11-30 MED ORDER — MIDAZOLAM HCL 2 MG/2ML IJ SOLN
INTRAMUSCULAR | Status: DC | PRN
  Administered 2019-11-30: 2 mg via INTRAVENOUS

## 2019-11-30 MED ORDER — FAMOTIDINE 20 MG PO TABS
ORAL_TABLET | ORAL | Status: AC
  Administered 2019-11-30: 20 mg via ORAL
  Filled 2019-11-30: qty 1

## 2019-11-30 MED ORDER — CHLORHEXIDINE GLUCONATE CLOTH 2 % EX PADS
6.0000 | MEDICATED_PAD | Freq: Once | CUTANEOUS | Status: AC
  Administered 2019-11-30: 6 via TOPICAL

## 2019-11-30 MED ORDER — FENTANYL CITRATE (PF) 100 MCG/2ML IJ SOLN
INTRAMUSCULAR | Status: AC
  Filled 2019-11-30: qty 2

## 2019-11-30 MED ORDER — HYDROCODONE-ACETAMINOPHEN 5-325 MG PO TABS: 1.0000 | ORAL_TABLET | Freq: Four times a day (QID) | ORAL | 0 refills | Status: DC | PRN

## 2019-11-30 MED ORDER — CEFAZOLIN SODIUM-DEXTROSE 2-4 GM/100ML-% IV SOLN
INTRAVENOUS | Status: AC
  Filled 2019-11-30: qty 100

## 2019-11-30 MED ORDER — GABAPENTIN 300 MG PO CAPS
ORAL_CAPSULE | ORAL | Status: AC
  Administered 2019-11-30: 300 mg via ORAL
  Filled 2019-11-30: qty 1

## 2019-11-30 MED ORDER — FAMOTIDINE 20 MG PO TABS: 20.0000 mg | ORAL_TABLET | Freq: Once | ORAL | Status: AC

## 2019-11-30 MED ORDER — FENTANYL CITRATE (PF) 100 MCG/2ML IJ SOLN
25.0000 ug | INTRAMUSCULAR | Status: DC | PRN
  Administered 2019-11-30 (×2): 25 ug via INTRAVENOUS

## 2019-11-30 MED ORDER — FENTANYL CITRATE (PF) 100 MCG/2ML IJ SOLN
INTRAMUSCULAR | Status: AC
  Administered 2019-11-30: 25 ug via INTRAVENOUS
  Filled 2019-11-30: qty 2

## 2019-11-30 MED ORDER — IBUPROFEN 800 MG PO TABS: 800.0000 mg | ORAL_TABLET | Freq: Three times a day (TID) | ORAL | 0 refills | Status: DC | PRN

## 2019-11-30 MED ORDER — PROPOFOL 10 MG/ML IV BOLUS
INTRAVENOUS | Status: AC
  Filled 2019-11-30: qty 20

## 2019-11-30 MED ORDER — ONDANSETRON HCL 4 MG/2ML IJ SOLN
4.0000 mg | Freq: Once | INTRAMUSCULAR | Status: AC | PRN
  Administered 2019-11-30: 4 mg via INTRAVENOUS

## 2019-11-30 MED ORDER — FENTANYL CITRATE (PF) 100 MCG/2ML IJ SOLN
INTRAMUSCULAR | Status: DC | PRN
Start: 2019-11-30 — End: 2019-11-30
  Administered 2019-11-30: 25 ug via INTRAVENOUS

## 2019-11-30 MED ORDER — ROCURONIUM BROMIDE 100 MG/10ML IV SOLN
INTRAVENOUS | Status: DC | PRN
  Administered 2019-11-30: 40 mg via INTRAVENOUS

## 2019-11-30 MED ORDER — IBUPROFEN 800 MG PO TABS
ORAL_TABLET | ORAL | Status: AC
  Administered 2019-11-30: 800 mg
  Filled 2019-11-30: qty 1

## 2019-11-30 MED ORDER — SUCCINYLCHOLINE CHLORIDE 200 MG/10ML IV SOSY
PREFILLED_SYRINGE | INTRAVENOUS | Status: AC
  Filled 2019-11-30: qty 10

## 2019-11-30 MED ORDER — LACTATED RINGERS IV SOLN: INTRAVENOUS | Status: DC

## 2019-11-30 MED ORDER — CHLORHEXIDINE GLUCONATE 0.12 % MT SOLN: 15.0000 mL | Freq: Once | OROMUCOSAL | Status: AC

## 2019-11-30 MED ORDER — LIDOCAINE HCL (PF) 2 % IJ SOLN
INTRAMUSCULAR | Status: AC
  Filled 2019-11-30: qty 5

## 2019-11-30 MED ORDER — PROPOFOL 10 MG/ML IV BOLUS
INTRAVENOUS | Status: DC | PRN
  Administered 2019-11-30: 150 mg via INTRAVENOUS

## 2019-11-30 MED ORDER — BUPIVACAINE-EPINEPHRINE 0.25% -1:200000 IJ SOLN
INTRAMUSCULAR | Status: DC | PRN
  Administered 2019-11-30: 9 mL

## 2019-11-30 MED ORDER — ORAL CARE MOUTH RINSE: 15.0000 mL | Freq: Once | OROMUCOSAL | Status: AC

## 2019-11-30 MED ORDER — DEXAMETHASONE SODIUM PHOSPHATE 10 MG/ML IJ SOLN
INTRAMUSCULAR | Status: DC | PRN
  Administered 2019-11-30: 10 mg via INTRAVENOUS

## 2019-11-30 SURGICAL SUPPLY — 28 items
ADH SKN CLS APL DERMABOND .7 (GAUZE/BANDAGES/DRESSINGS) ×1
APL PRP STRL LF DISP 70% ISPRP (MISCELLANEOUS) ×1
BLADE SURG 15 STRL LF DISP TIS (BLADE) ×1 IMPLANT
BLADE SURG 15 STRL SS (BLADE) ×2
CANISTER SUCT 1200ML W/VALVE (MISCELLANEOUS) ×2 IMPLANT
CHLORAPREP W/TINT 26 (MISCELLANEOUS) ×2 IMPLANT
COVER WAND RF STERILE (DRAPES) ×2 IMPLANT
DECANTER SPIKE VIAL GLASS SM (MISCELLANEOUS) ×2 IMPLANT
DERMABOND ADVANCED (GAUZE/BANDAGES/DRESSINGS) ×1
DERMABOND ADVANCED .7 DNX12 (GAUZE/BANDAGES/DRESSINGS) ×1 IMPLANT
DRAPE LAPAROTOMY 77X122 PED (DRAPES) ×2 IMPLANT
ELECT CAUTERY BLADE 6.4 (BLADE) ×2 IMPLANT
ELECT REM PT RETURN 9FT ADLT (ELECTROSURGICAL) ×2
ELECTRODE REM PT RTRN 9FT ADLT (ELECTROSURGICAL) ×1 IMPLANT
GLOVE ORTHO TXT STRL SZ7.5 (GLOVE) ×2 IMPLANT
GOWN STRL REUS W/ TWL LRG LVL3 (GOWN DISPOSABLE) ×2 IMPLANT
GOWN STRL REUS W/TWL LRG LVL3 (GOWN DISPOSABLE) ×4
KIT TURNOVER KIT A (KITS) ×2 IMPLANT
NEEDLE HYPO 22GX1.5 SAFETY (NEEDLE) ×2 IMPLANT
NS IRRIG 500ML POUR BTL (IV SOLUTION) ×2 IMPLANT
PACK BASIN MINOR (MISCELLANEOUS) ×2 IMPLANT
SUT ETHIBOND 0 MO6 C/R (SUTURE) ×2 IMPLANT
SUT MNCRL 4-0 (SUTURE) ×2
SUT MNCRL 4-0 27XMFL (SUTURE) ×1
SUT VIC AB 3-0 SH 27 (SUTURE) ×2
SUT VIC AB 3-0 SH 27X BRD (SUTURE) ×1 IMPLANT
SUTURE MNCRL 4-0 27XMF (SUTURE) ×1 IMPLANT
SYR 10ML LL (SYRINGE) ×2 IMPLANT

## 2019-11-30 NOTE — Interval H&P Note (Signed)
History and Physical Interval Note:  11/30/2019 8:44 AM  [name]  has presented today for surgery, with the diagnosis of umbilical hernia.  The various methods of treatment have been discussed with the patient and family. After consideration of risks, benefits and other options for treatment, the patient has consented to  Procedure(s): HERNIA REPAIR UMBILICAL ADULT, open (N/A) as a surgical intervention.  The patient's history has been reviewed, patient examined, no change in status, stable for surgery.  I have reviewed the patient's chart and labs.  Questions were answered to the patient's satisfaction.     Tamara Flynn

## 2019-11-30 NOTE — Anesthesia Procedure Notes (Signed)
Procedure Name: Intubation Date/Time: 11/30/2019 9:06 AM Performed by: Doreen Salvage, CRNA Pre-anesthesia Checklist: Patient identified, Patient being monitored, Timeout performed, Emergency Drugs available and Suction available Patient Re-evaluated:Patient Re-evaluated prior to induction Oxygen Delivery Method: Circle system utilized Preoxygenation: Pre-oxygenation with 100% oxygen Induction Type: IV induction Ventilation: Mask ventilation without difficulty Laryngoscope Size: Mac and 3 Grade View: Grade I Tube type: Oral Tube size: 7.0 mm Number of attempts: 1 Airway Equipment and Method: Stylet Placement Confirmation: ETT inserted through vocal cords under direct vision,  positive ETCO2 and breath sounds checked- equal and bilateral Secured at: 21 cm Tube secured with: Tape Dental Injury: Teeth and Oropharynx as per pre-operative assessment

## 2019-11-30 NOTE — Anesthesia Preprocedure Evaluation (Addendum)
Anesthesia Evaluation  Patient identified by MRN, date of birth, ID band Patient awake    Reviewed: Allergy & Precautions, NPO status , Patient's Chart, lab work & pertinent test results  History of Anesthesia Complications Negative for: history of anesthetic complications  Airway Mallampati: II       Dental   Pulmonary asthma , neg sleep apnea, neg COPD, Not current smoker,           Cardiovascular hypertension (with pre-eclampsia), (-) Past MI and (-) CHF (-) dysrhythmias (-) Valvular Problems/Murmurs     Neuro/Psych neg Seizures    GI/Hepatic Neg liver ROS, GERD (resolved with resolution of pre-eclampsia)  Medicated,  Endo/Other  neg diabetes  Renal/GU negative Renal ROS     Musculoskeletal   Abdominal   Peds  Hematology   Anesthesia Other Findings   Reproductive/Obstetrics                            Anesthesia Physical Anesthesia Plan  ASA: II  Anesthesia Plan: General   Post-op Pain Management:    Induction: Intravenous  PONV Risk Score and Plan: 3 and Ondansetron, Dexamethasone and Treatment may vary due to age or medical condition  Airway Management Planned: Oral ETT  Additional Equipment:   Intra-op Plan:   Post-operative Plan:   Informed Consent: I have reviewed the patients History and Physical, chart, labs and discussed the procedure including the risks, benefits and alternatives for the proposed anesthesia with the patient or authorized representative who has indicated his/her understanding and acceptance.       Plan Discussed with:   Anesthesia Plan Comments:         Anesthesia Quick Evaluation

## 2019-11-30 NOTE — Transfer of Care (Signed)
Immediate Anesthesia Transfer of Care Note  Patient: Tamara Flynn  Procedure(s) Performed: Procedure(s): HERNIA REPAIR UMBILICAL ADULT, open (N/A)  Patient Location: PACU  Anesthesia Type:General  Level of Consciousness: sedated  Airway & Oxygen Therapy: Patient Spontanous Breathing and Patient connected to face mask oxygen  Post-op Assessment: Report given to RN and Post -op Vital signs reviewed and stable  Post vital signs: Reviewed and stable  Last Vitals:  Vitals:   11/30/19 0830 11/30/19 0951  BP: 117/70 103/62  Pulse: 87 95  Resp: 20 (!) 21  Temp: 36.8 C (!) 36.2 C  SpO2: 989% 211%    Complications: No apparent anesthesia complications

## 2019-11-30 NOTE — Anesthesia Postprocedure Evaluation (Signed)
Anesthesia Post Note  Patient: Tamara Flynn  Procedure(s) Performed: HERNIA REPAIR UMBILICAL ADULT, open (N/A )  Patient location during evaluation: PACU Anesthesia Type: General Level of consciousness: awake and alert Pain management: pain level controlled Vital Signs Assessment: post-procedure vital signs reviewed and stable Respiratory status: spontaneous breathing and respiratory function stable Cardiovascular status: stable Anesthetic complications: no   No complications documented.   Last Vitals:  Vitals:   11/30/19 1207 11/30/19 1225  BP: 120/78 109/76  Pulse: 87 82  Resp: 16 16  Temp:    SpO2: 100% 97%    Last Pain:  Vitals:   11/30/19 1225  TempSrc:   PainSc: 5                  Quintina Hakeem K

## 2019-11-30 NOTE — Op Note (Signed)
Umbilical Hernia Repair  Pre-operative Diagnosis: Umbilical hernia  Post-operative Diagnosis: same  Surgeon: Ronny Bacon, MD FACS  Anesthesia: General   Findings: 2 cm fascial defect diameter    Estimated Blood Loss: 5 mL                 Specimens: none.         Complications: none              Procedure Details  The patient was seen again in the Holding Room. The benefits, complications, treatment options, and expected outcomes were discussed with the patient. The risks of bleeding, infection, recurrence of symptoms, failure to resolve symptoms, bowel injury, mesh placement, mesh infection, any of which could require further surgery were reviewed with the patient. The likelihood of improving the patient's symptoms with return to their baseline status is good.  The patient and/or family concurred with the proposed plan, giving informed consent.  The patient was taken to Operating Room, identified, and the procedure verified.  A Time Out was held and the above information confirmed.  Prior to the induction of general anesthesia, antibiotic prophylaxis was administered. VTE prophylaxis was in place. General endotracheal anesthesia was then administered and tolerated well. After the induction, the abdomen was prepped with Chloraprep and draped in the sterile fashion. The patient was positioned in the supine position.  Local infiltration of quarter percent Marcaine with epinephrine is initiated. Incision was created with a scalpel over the hernia defect. Electrocautery was used to dissect through subcutaneous tissue, the the dermal fascial junction was opened and the fascial defect ring was identified. The hernia was measured.  2 cm I closed the hernia defect with interrupted 0 Ethibond sutures in a vest overpants fashion.   Incision was closed in a 2 layer fashion with 3-0 Vicryl and 4-0 Monocryl. Dermabond was used to coat the skin. Marcaine quarter percent with epinephrine and  lidocaine 1% was used to inject all the incision sites. Patient tolerated procedure well and there were no immediate complications. Needle and laparotomy counts were correct   Ronny Bacon, M.D., [facility]  11/30/2019 ; 9:50 AM

## 2019-11-30 NOTE — Discharge Instructions (Signed)
AMBULATORY SURGERY  °DISCHARGE INSTRUCTIONS ° ° °1) The drugs that you were given will stay in your system until tomorrow so for the next 24 hours you should not: ° °A) Drive an automobile °B) Make any legal decisions °C) Drink any alcoholic beverage ° ° °2) You may resume regular meals tomorrow.  Today it is better to start with liquids and gradually work up to solid foods. ° °You may eat anything you prefer, but it is better to start with liquids, then soup and crackers, and gradually work up to solid foods. ° ° °3) Please notify your doctor immediately if you have any unusual bleeding, trouble breathing, redness and pain at the surgery site, drainage, fever, or pain not relieved by medication. ° ° ° °4) Additional Instructions: ° ° ° ° ° ° ° °Please contact your physician with any problems or Same Day Surgery at [phone], Monday through Friday 6 am to 4 pm, or [place] [facility] [place] Main number at [phone]. °

## 2019-12-01 ENCOUNTER — Emergency Department
Admission: EM | Admit: 2019-12-01 | Discharge: 2019-12-01 | Disposition: A | Discharge status: Home / Self Care | Payer: Medicaid Other | Attending: Emergency Medicine | Admitting: Emergency Medicine

## 2019-12-01 ENCOUNTER — Encounter: Payer: Self-pay | Admitting: Surgery

## 2019-12-01 ENCOUNTER — Other Ambulatory Visit: Payer: Self-pay

## 2019-12-01 ENCOUNTER — Encounter: Payer: Self-pay | Admitting: Emergency Medicine

## 2019-12-01 DIAGNOSIS — G8918 Other acute postprocedural pain: Secondary | ICD-10-CM | POA: Insufficient documentation

## 2019-12-01 DIAGNOSIS — J45909 Unspecified asthma, uncomplicated: Secondary | ICD-10-CM | POA: Diagnosis not present

## 2019-12-01 DIAGNOSIS — I10 Essential (primary) hypertension: Secondary | ICD-10-CM | POA: Diagnosis not present

## 2019-12-01 DIAGNOSIS — Z79899 Other long term (current) drug therapy: Secondary | ICD-10-CM | POA: Diagnosis not present

## 2019-12-01 DIAGNOSIS — R109 Unspecified abdominal pain: Secondary | ICD-10-CM | POA: Diagnosis present

## 2019-12-01 LAB — URINALYSIS, COMPLETE (UACMP) WITH MICROSCOPIC
Bilirubin Urine: NEGATIVE
Glucose, UA: NEGATIVE mg/dL
Hgb urine dipstick: NEGATIVE
Ketones, ur: NEGATIVE mg/dL
Leukocytes,Ua: NEGATIVE
Nitrite: NEGATIVE
Protein, ur: NEGATIVE mg/dL
Specific Gravity, Urine: 1.018 (ref 1.005–1.030)
pH: 5 (ref 5.0–8.0)

## 2019-12-01 LAB — CBC WITH DIFFERENTIAL/PLATELET
Abs Immature Granulocytes: 0.06 10*3/uL (ref 0.00–0.07)
Basophils Absolute: 0.1 10*3/uL (ref 0.0–0.1)
Basophils Relative: 0 %
Eosinophils Absolute: 0.1 10*3/uL (ref 0.0–0.5)
Eosinophils Relative: 0 %
HCT: 37 % (ref 36.0–46.0)
Hemoglobin: 12.9 g/dL (ref 12.0–15.0)
Immature Granulocytes: 0 %
Lymphocytes Relative: 21 %
Lymphs Abs: 3.5 10*3/uL (ref 0.7–4.0)
MCH: 30.2 pg (ref 26.0–34.0)
MCHC: 34.9 g/dL (ref 30.0–36.0)
MCV: 86.7 fL (ref 80.0–100.0)
Monocytes Absolute: 0.7 10*3/uL (ref 0.1–1.0)
Monocytes Relative: 4 %
Neutro Abs: 12.2 10*3/uL — ABNORMAL HIGH (ref 1.7–7.7)
Neutrophils Relative %: 75 %
Platelets: 261 10*3/uL (ref 150–400)
RBC: 4.27 MIL/uL (ref 3.87–5.11)
RDW: 12.6 % (ref 11.5–15.5)
WBC: 16.6 10*3/uL — ABNORMAL HIGH (ref 4.0–10.5)
nRBC: 0 % (ref 0.0–0.2)

## 2019-12-01 LAB — BASIC METABOLIC PANEL
Anion gap: 10 (ref 5–15)
BUN: 13 mg/dL (ref 6–20)
CO2: 26 mmol/L (ref 22–32)
Calcium: 8.5 mg/dL — ABNORMAL LOW (ref 8.9–10.3)
Chloride: 103 mmol/L (ref 98–111)
Creatinine, Ser: 0.9 mg/dL (ref 0.44–1.00)
GFR calc Af Amer: >60 mL/min (ref >60)
GFR calc non Af Amer: >60 mL/min (ref >60)
Glucose, Bld: 113 mg/dL — ABNORMAL HIGH (ref 70–99)
Potassium: 3.2 mmol/L — ABNORMAL LOW (ref 3.5–5.1)
Sodium: 139 mmol/L (ref 135–145)

## 2019-12-01 MED ORDER — IBUPROFEN 800 MG PO TABS
800.0000 mg | ORAL_TABLET | Freq: Once | ORAL | Status: AC
  Administered 2019-12-01: 800 mg via ORAL
  Filled 2019-12-01: qty 1

## 2019-12-01 MED ORDER — FLUCONAZOLE 150 MG PO TABS: 150.0000 mg | ORAL_TABLET | Freq: Every day | ORAL | 0 refills | Status: DC

## 2019-12-01 MED ORDER — CYCLOBENZAPRINE HCL 10 MG PO TABS
5.0000 mg | ORAL_TABLET | Freq: Once | ORAL | Status: AC
  Administered 2019-12-01: 5 mg via ORAL
  Filled 2019-12-01: qty 1

## 2019-12-01 MED ORDER — CYCLOBENZAPRINE HCL 10 MG PO TABS: 5.0000 mg | ORAL_TABLET | Freq: Three times a day (TID) | ORAL | 0 refills | Status: DC | PRN

## 2019-12-01 MED ORDER — POTASSIUM CHLORIDE CRYS ER 20 MEQ PO TBCR
20.0000 meq | EXTENDED_RELEASE_TABLET | Freq: Once | ORAL | Status: AC
  Administered 2019-12-01: 20 meq via ORAL
  Filled 2019-12-01: qty 1

## 2019-12-01 MED ORDER — HYDROCODONE-ACETAMINOPHEN 5-325 MG PO TABS
2.0000 | ORAL_TABLET | Freq: Once | ORAL | Status: AC
  Administered 2019-12-01: 2 via ORAL
  Filled 2019-12-01: qty 2

## 2019-12-01 MED ORDER — SODIUM CHLORIDE 0.9 % IV BOLUS
1000.0000 mL | Freq: Once | INTRAVENOUS | Status: AC
  Administered 2019-12-01: 1000 mL via INTRAVENOUS

## 2019-12-01 NOTE — ED Notes (Signed)
RN discussed pt with Dr. Cheri Fowler, per Dr. Cheri Fowler, no protocols needed. Pt appropriate for flex for pain control.

## 2019-12-01 NOTE — ED Provider Notes (Signed)
[facility] Emergency Department Provider Note  ____________________________________________   First MD Initiated Contact with Patient 12/01/19 1516     (approximate)  I have reviewed the triage vital signs and the nursing notes.   HISTORY  Chief Complaint Post-op Problem   HPI Tamara Flynn is a 24 y.o. female who presents to the emergency department at the recommendation of the on-call nurse for her surgeon.  The patient had an abdominal hernia repair yesterday with Dr. Lutricia Feil in this facility.  She presents today for pain that is not controlled with her postop medications of 800 mg of ibuprofen and Norco 5/325.  She is also concerned about some redness around the incision site.  She denies fever, denies chills, denies chest pain.  Abdominal pain is isolated to near the incision sites.  Pain at this moment is a 7/10, but she states this worsens the further she gets from her last dose of pain medicine.  She is also is complaining of bleeding from the incision site.      Past Medical History:  Diagnosis Date  . Abdominal pain during pregnancy in second trimester 03/13/2018  . Asthma   . GERD (gastroesophageal reflux disease)   . Hypertension    with pregnancy    Patient Active Problem List   Diagnosis Date Noted  . Preeclampsia in postpartum period 06/19/2018  . Hypertension in pregnancy, preeclampsia, severe, delivered/postpartum 06/13/2018  . Delivery of pregnancy by cesarean section 06/13/2018  . Failed induction of labor 06/13/2018  . Fetal intolerance to labor, delivered, current hospitalization 06/13/2018  . Gestational hypertension 06/07/2018  . Polyhydramnios affecting pregnancy 04/28/2018  . Supervision of high risk pregnancy, antepartum 11/18/2017    Past Surgical History:  Procedure Laterality Date  . CESAREAN SECTION N/A 06/12/2018   Procedure: CESAREAN SECTION;  Surgeon: Will Bonnet, MD;  Location: [facility] ORS;  Service:  Obstetrics;  Laterality: N/A;  . UMBILICAL HERNIA REPAIR N/A 11/30/2019   Procedure: HERNIA REPAIR UMBILICAL ADULT, open;  Surgeon: Ronny Bacon, MD;  Location: [facility] ORS;  Service: General;  Laterality: N/A;  . WISDOM TOOTH EXTRACTION      Prior to Admission medications   Medication Sig Start Date End Date Taking? Authorizing Provider  Adapalene-Benzoyl Peroxide (EPIDUO FORTE) 0.3-2.5 % GEL Apply 1 application topically at bedtime.  09/17/19   [provider]  FLOVENT HFA 220 MCG/ACT inhaler Inhale 1 puff into the lungs 2 (two) times daily. Patient taking differently: Inhale 1 puff into the lungs 2 (two) times daily as needed (respiratory issues.).  06/28/18   Will Bonnet, MD  HYDROcodone-acetaminophen (NORCO/VICODIN) 5-325 MG tablet Take 1 tablet by mouth every 6 (six) hours as needed for moderate pain. 11/30/19   Ronny Bacon, MD  ibuprofen (ADVIL) 800 MG tablet Take 1 tablet (800 mg total) by mouth every 8 (eight) hours as needed. 11/30/19   Ronny Bacon, MD  PROAIR HFA 108 [phone] Base) MCG/ACT inhaler Inhale 2 puffs into the lungs every 4 (four) hours as needed. Patient taking differently: Inhale 2 puffs into the lungs every 4 (four) hours as needed for wheezing or shortness of breath.  06/28/18   Will Bonnet, MD  spironolactone (ALDACTONE) 50 MG tablet Take 50 mg by mouth daily.  09/17/19   [provider]    Allergies No healthtouch food allergies, Ciprofloxacin hcl, Sulfa antibiotics, and Sulfamethoxazole-trimethoprim  Family History  Problem Relation Age of Onset  . Epilepsy Mother   . Epilepsy Father  Social History Social History   Tobacco Use  . Smoking status: Never Smoker  . Smokeless tobacco: Never Used  Vaping Use  . Vaping Use: Never used  Substance Use Topics  . Alcohol use: No  . Drug use: No    Review of Systems Constitutional: No fever/chills Eyes: No visual changes. ENT: No sore throat. Cardiovascular: Denies chest  pain. Respiratory: Denies shortness of breath. Gastrointestinal: + abdominal pain.  No nausea, no vomiting.  No diarrhea.  No constipation. Genitourinary: Negative for dysuria. Musculoskeletal: Negative for back pain. Skin: Negative for rash. Neurological: Negative for headaches, focal weakness or numbness.   ____________________________________________   PHYSICAL EXAM:  VITAL SIGNS: ED Triage Vitals [12/01/19 1338]  Enc Vitals Group     BP 122/81     Pulse Rate 94     Resp 16     Temp 99.1 F (37.3 C)     Temp Source Oral     SpO2 100 %     Weight      Height      Head Circumference      Peak Flow      Pain Score 7     Pain Loc      Pain Edu?      Excl. in [place]?    Constitutional: Alert and oriented. Well appearing and in no acute distress. Eyes: Conjunctivae are normal.  Head: Atraumatic. Nose: No congestion/rhinnorhea. Mouth/Throat: Mucous membranes are moist.   Neck: No stridor.   Cardiovascular: Normal rate, regular rhythm. Grossly normal heart sounds.  Good peripheral circulation. Respiratory: Normal respiratory effort.  No retractions. Lungs CTAB. Gastrointestinal: Soft without distention.  There is an incision site with Dermabond over it.  The incision is not erythematous or swollen.  There is some mild erythema just superior to the incision site.  The surrounding area is tender without induration. Musculoskeletal: No lower extremity tenderness nor edema.  No joint effusions. Neurologic:  Normal speech and language. No gross focal neurologic deficits are appreciated. No gait instability. Skin: See above. Psychiatric: Mood and affect are normal. Speech and behavior are normal.  ____________________________________________   LABS (all labs ordered are listed, but only abnormal results are displayed)  Labs Reviewed  CBC WITH DIFFERENTIAL/PLATELET - Abnormal; Notable for the following components:      Result Value   WBC 16.6 (*)    Neutro Abs 12.2 (*)    All  other components within normal limits  BASIC METABOLIC PANEL - Abnormal; Notable for the following components:   Potassium 3.2 (*)    Glucose, Bld 113 (*)    Calcium 8.5 (*)    All other components within normal limits  URINALYSIS, COMPLETE (UACMP) WITH MICROSCOPIC - Abnormal; Notable for the following components:   Color, Urine YELLOW (*)    APPearance HAZY (*)    Bacteria, UA FEW (*)    All other components within normal limits    ____________________________________________   INITIAL IMPRESSION / ASSESSMENT AND PLAN / ED COURSE  As part of my medical decision making, I reviewed the following data within the [place] notes reviewed and incorporated, Old chart reviewed, A consult was requested and obtained from this/these consultant(s) Surgery and Notes from prior ED visits        Kaelynne Christley is a 24 year old female who presents to the emergency department 1 day post abdominal hernia repair for pain that is not controlled by her postop medications as well as drainage from the incision  site.  On physical exam, there is mild erythema just superior to the incision site that appears where a wound dressing may have been located in his not over the incision itself.  There is scant amount of serosanguineous drainage from the right most aspect of the incision.  The incision still looks appropriately approximated with no concerning signs for infection.  Labs were ordered which demonstrates elevated white count at 16.6 with a left shift 12.2.  She has a grossly normal urinalysis.  BMP of the patient demonstrates some mild hypokalemia and mild hypocalcemia.  Called the on-call surgeon, Dr. Dahlia Byes to discuss.  He recommended adding Flexeril 5 mg to the patient's postop medication therapy.  He also recommended IV fluid replacement and electrolyte correction.  This was completed and she was discharged in a stable condition for follow-up with Dr. Lutricia Feil.  She should return  to the emergency department for any worsening symptoms and the patient is in agreement with this plan.      ____________________________________________   FINAL CLINICAL IMPRESSION(S) / ED DIAGNOSES  Final diagnoses:  Post-operative pain     ED Discharge Orders    None      *Please note:  GINIA RUDELL was evaluated in Emergency Department on 12/01/2019 for the symptoms described in the history of present illness. She was evaluated in the context of the global COVID-19 pandemic, which necessitated consideration that the patient might be at risk for infection with the SARS-CoV-2 virus that causes COVID-19. Institutional protocols and algorithms that pertain to the evaluation of patients at risk for COVID-19 are in a state of rapid change based on information released by regulatory bodies including the CDC and federal and state organizations. These policies and algorithms were followed during the patient's care in the ED.  Some ED evaluations and interventions may be delayed as a result of limited staffing during and the pandemic.*   Note:  This document was prepared using Dragon voice recognition software and may include unintentional dictation errors.    Marlana Salvage, PA 12/01/19 1924    Vladimir Crofts, MD 12/01/19 [phone]

## 2019-12-01 NOTE — Discharge Instructions (Addendum)
Please continue the postoperative pain medications you are prescribed by Dr. Christian Mate.  In addition to this, you have been prescribed Flexeril 5 mg to take every 8 hours as needed for pain.  It is also recommended that you use ice packs locally to the area for up to 20 minutes at a time.  Please follow-up with Dr. Lutricia Feil as previously planned.

## 2019-12-01 NOTE — ED Triage Notes (Signed)
Pt to ED via POV. Pt states that she had hernia surgery yesterday. Pt noticed this morning bleeding from the incision. Pt also states that the area is red and that her pain is not being controlled by pain medication. Pt noted to have a scant amount of bleeding from surgical area. Pt was given hydrocodone 5/325 and ibuprofen 800 mg. Pt last took hydrocodone around 10.

## 2019-12-03 ENCOUNTER — Other Ambulatory Visit: Payer: Self-pay

## 2019-12-03 ENCOUNTER — Telehealth: Payer: Self-pay | Admitting: *Deleted

## 2019-12-03 MED ORDER — FLUCONAZOLE 100 MG PO TABS: 100.0000 mg | ORAL_TABLET | Freq: Every day | ORAL | 0 refills | Status: DC

## 2019-12-03 NOTE — Telephone Encounter (Signed)
Spoke with the patient and let her know that her prescriptions were sent into the [facility] pharmacy. She states that she was supposed to get a refill on the Diflucan due to the severity of the yeast infection. I let her know we would send in a refill of this. She also had concerns about pain control. Patient instructed to take one Vicodin and then 3 hours later take 600 mg of Ibuprofen and then 3 hours later take one Vicodin. After she runs out of Vicodin she may replace this with 2 extra strength Tylenol and keep rotating with the Ibuprofen. She should also take the Flexeril every 8 hours for the next few days until the pain gets better and then just as needed. She was also instructed to use a heating pad to the surgical area several times a day for 15-20 minutes at a time. She will call back to be seen sooner to be seen if she has any further problems.

## 2019-12-03 NOTE — Telephone Encounter (Signed)
Patient called and stated that she had surgery on 11/30/19 by Dr Christian Mate umbilical hernia repair. She stated that she is in a lot of pain and the pain medication was not working (hydrocodone). She stated that she ended up going to the ER on 12/01/19 and they didn't really give her any more pain medication. She also stated something about having a yeast infection and was suppose to get medicaiton for that but it was never sent in to her pharmacy Please call and advise

## 2019-12-06 ENCOUNTER — Encounter: Payer: Self-pay | Admitting: Surgery

## 2019-12-10 ENCOUNTER — Telehealth: Payer: Self-pay | Admitting: Surgery

## 2019-12-10 NOTE — Telephone Encounter (Signed)
Pt states she has been having headaches and urine odor since surgery. Went to the ER 9/25 and gave her fluids per patient. Patient states she is only taking Ibuprofen, out of narcotics. Pt denies f, chills, n,v, denies drainage to incision site. Patient has not taken anything for headaches. Pt has GYN appt on 10/11. Advised patient to take something for head over the counter. Pt does not want to come in sooner for post op appt. Message sent to Dr Christian Mate, awaiting response.

## 2019-12-10 NOTE — Telephone Encounter (Signed)
Patient has been having headaches now for the past four days and also having right sided abdominal pain.  She had surgery on 11/30/19 with Dr. Christian Mate.  Please call her.  Thank you.

## 2019-12-11 ENCOUNTER — Encounter: Payer: Self-pay | Admitting: Surgery

## 2019-12-11 ENCOUNTER — Ambulatory Visit (INDEPENDENT_AMBULATORY_CARE_PROVIDER_SITE_OTHER): Payer: Medicaid Other | Admitting: Surgery

## 2019-12-11 ENCOUNTER — Other Ambulatory Visit: Payer: Self-pay

## 2019-12-11 VITALS — BP 122/87 | HR 69 | Temp 98.8°F | Resp 12 | Ht 59.0 in | Wt 142.0 lb

## 2019-12-11 DIAGNOSIS — R1011 Right upper quadrant pain: Secondary | ICD-10-CM

## 2019-12-11 NOTE — Telephone Encounter (Signed)
Per Dr Christian Mate patient scheduled to be seen this afternoon at 1:30pm. Pt made aware and voiced understanding.

## 2019-12-11 NOTE — Patient Instructions (Addendum)
Please call if you have any questions or concerns.  Ultrasound scheduled for 12/14/19 @ [facility] at 9:30 .Please do not eat 4 hours prior to having this done. Dr.Rodenberg will call you with the results.   Constipation, Adult Constipation is when a person:  Poops (has a bowel movement) fewer times in a week than normal.  Has a hard time pooping.  Has poop that is dry, hard, or bigger than normal. Follow these instructions at home: Eating and drinking   Eat foods that have a lot of fiber, such as: ? Fresh fruits and vegetables. ? Whole grains. ? Beans.  Eat less of foods that are high in fat, low in fiber, or overly processed, such as: ? [place] fries. ? Hamburgers. ? Cookies. ? Candy. ? Soda.  Drink enough fluid to keep your pee (urine) clear or pale yellow. General instructions  Exercise regularly or as told by your doctor.  Go to the restroom when you feel like you need to poop. Do not hold it in.  Take over-the-counter and prescription medicines only as told by your doctor. These include any fiber supplements.  Do pelvic floor retraining exercises, such as: ? Doing deep breathing while relaxing your lower belly (abdomen). ? Relaxing your pelvic floor while pooping.  Watch your condition for any changes.  Keep all follow-up visits as told by your doctor. This is important. Contact a doctor if:  You have pain that gets worse.  You have a fever.  You have not pooped for 4 days.  You throw up (vomit).  You are not hungry.  You lose weight.  You are bleeding from the anus.  You have thin, pencil-like poop (stool). Get help right away if:  You have a fever, and your symptoms suddenly get worse.  You leak poop or have blood in your poop.  Your belly feels hard or bigger than normal (is bloated).  You have very bad belly pain.  You feel dizzy or you faint. This information is not intended to replace advice given to you by your health care  provider. Make sure you discuss any questions you have with your health care provider. Document Revised: 02/04/2017 Document Reviewed: 08/13/2015 Elsevier Patient Education  2020 [place] PATIENT INSTRUCTIONS   WOUND CARE INSTRUCTIONS:  Keep a dry clean dressing on the wound if there is drainage. The initial bandage may be removed after 24 hours.  Once the wound has quit draining you may leave it open to air.  If clothing rubs against the wound or causes irritation and the wound is not draining you may cover it with a dry dressing during the daytime.  Try to keep the wound dry and avoid ointments on the wound unless directed to do so.  If the wound becomes bright red and painful or starts to drain infected material that is not clear, please contact your physician immediately.  If the wound is mildly pink and has a thick firm ridge underneath it, this is normal, and is referred to as a healing ridge.  This will resolve over the next 4-6 weeks.  BATHING: You may shower if you have been informed of this by your surgeon. However, Please do not submerge in a tub, hot tub, or pool until incisions are completely sealed or have been told by your surgeon that you may do so.  DIET:  You may eat any foods that you can tolerate.  It is a good idea to eat a high  fiber diet and take in plenty of fluids to prevent constipation.  If you do become constipated you may want to take a mild laxative or take ducolax tablets on a daily basis until your bowel habits are regular.  Constipation can be very uncomfortable, along with straining, after recent surgery.  ACTIVITY:  You are encouraged to cough and deep breath or use your incentive spirometer if you were given one, every 15-30 minutes when awake.  This will help prevent respiratory complications and low grade fevers post-operatively if you had a general anesthetic.  You may want to hug a pillow when coughing and sneezing to add additional  support to the surgical area, if you had abdominal or chest surgery, which will decrease pain during these times.  You are encouraged to walk and engage in light activity for the next two weeks.  You should not lift more than 20 pounds, until 01/11/2020 as it could put you at increased risk for complications.  Twenty pounds is roughly equivalent to a plastic bag of groceries. At that time- Listen to your body when lifting, if you have pain when lifting, stop and then try again in a few days. Soreness after doing exercises or activities of daily living is normal as you get back in to your normal routine.  MEDICATIONS:  Try to take narcotic medications and anti-inflammatory medications, such as tylenol, ibuprofen, naprosyn, etc., with food.  This will minimize stomach upset from the medication.  Should you develop nausea and vomiting from the pain medication, or develop a rash, please discontinue the medication and contact your physician.  You should not drive, make important decisions, or operate machinery when taking narcotic pain medication.  SUNBLOCK Use sun block to incision area over the next year if this area will be exposed to sun. This helps decrease scarring and will allow you avoid a permanent darkened area over your incision.  QUESTIONS:  Please feel free to call our office if you have any questions, and we will be glad to assist you. [phone]

## 2019-12-11 NOTE — Progress Notes (Signed)
[facility] POST-OP OFFICE VISIT  12/11/2019  HPI: Tamara Flynn is a 24 y.o. female 11 days s/p supraumbilical epigastric hernia repair.  She has had multiple other pain issues that began immediately postop.  She was seen in ED on the very next day.  She has had no vomiting, no fevers, no chills.  She reported having drainage from incision but it resolved and none was ever appreciated even by the ED on her first postop day.  She complained of some urine odor and had a yeast infection and was given some Diflucan.  She complained of some abdominal pain and was constipated and tried some MiraLAX.  She wants more pain medications.  She indicated she had some right upper quadrant pain  Vital signs: BP 122/87   Pulse 69   Temp 98.8 F (37.1 C) (Oral)   Resp 12   Ht 4\' 11"  (1.499 m)   Wt 142 lb (64.4 kg)   LMP 11/21/2019   SpO2 97%   BMI 28.68 kg/m    Physical Exam: Constitutional: She appears well.  Abdomen: benign, soft and non tender Skin: incision is well healed, c/d & I  Assessment/Plan: This is a 24 y.o. female 11 days s/p epigastric hernia repair.   Patient Active Problem List   Diagnosis Date Noted  . Preeclampsia in postpartum period 06/19/2018  . Hypertension in pregnancy, preeclampsia, severe, delivered/postpartum 06/13/2018  . Delivery of pregnancy by cesarean section 06/13/2018  . Failed induction of labor 06/13/2018  . Fetal intolerance to labor, delivered, current hospitalization 06/13/2018  . Gestational hypertension 06/07/2018  . Polyhydramnios affecting pregnancy 04/28/2018  . Supervision of high risk pregnancy, antepartum 11/18/2017    - Will get RUQ U/s for her c/o pain.    Ronny Bacon M.D., FACS 12/11/2019, 5:18 PM

## 2019-12-13 ENCOUNTER — Encounter: Payer: Medicaid Other | Admitting: Surgery

## 2019-12-14 ENCOUNTER — Ambulatory Visit
Admission: RE | Admit: 2019-12-14 | Discharge: 2019-12-14 | Disposition: A | Discharge status: Home / Self Care | Payer: Medicaid Other | Source: Ambulatory Visit | Attending: Surgery | Admitting: Surgery

## 2019-12-14 ENCOUNTER — Other Ambulatory Visit: Payer: Self-pay

## 2019-12-14 DIAGNOSIS — R1011 Right upper quadrant pain: Secondary | ICD-10-CM | POA: Insufficient documentation

## 2019-12-17 ENCOUNTER — Ambulatory Visit: Payer: Medicaid Other | Admitting: Obstetrics and Gynecology

## 2019-12-20 ENCOUNTER — Encounter: Payer: Medicaid Other | Admitting: Surgery

## 2020-01-17 ENCOUNTER — Other Ambulatory Visit: Payer: Self-pay

## 2020-01-17 ENCOUNTER — Ambulatory Visit (INDEPENDENT_AMBULATORY_CARE_PROVIDER_SITE_OTHER): Payer: Medicaid Other | Admitting: Surgery

## 2020-01-17 ENCOUNTER — Encounter: Payer: Self-pay | Admitting: Surgery

## 2020-01-17 VITALS — BP 125/84 | HR 102 | Temp 98.9°F | Ht 60.0 in | Wt 143.6 lb

## 2020-01-17 DIAGNOSIS — Z8719 Personal history of other diseases of the digestive system: Secondary | ICD-10-CM | POA: Insufficient documentation

## 2020-01-17 DIAGNOSIS — Z9889 Other specified postprocedural states: Secondary | ICD-10-CM

## 2020-01-17 NOTE — Progress Notes (Signed)
[facility] POST-OP OFFICE VISIT  01/17/2020  HPI: Tamara Flynn is a 24 y.o. female roughly 77 days s/p ventral hernia repair.  She wanted to be reevaluated as she was seen so quickly after surgery, and address concerns about the thickening of her supraumbilical scar.  She denies any pain.  She is clearly able to perform crunches without difficulty.  Vital signs: BP 125/84   Pulse (!) 102   Temp 98.9 F (37.2 C) (Oral)   Ht 5' (1.524 m)   Wt 143 lb 9.6 oz (65.1 kg)   SpO2 97%   BMI 28.04 kg/m    Physical Exam: Constitutional: She appears well. Abdomen: Scar well-healed, diastases recti still noted.  No distinct bulges or masses. Skin: Scar well-healed, the subcutaneous tissues, consistent with the fascia there is a heaped up scar consistent with the repair.  No fascial defects appreciated.  Assessment/Plan: This is a 24 y.o. female 53 days s/p epigastric hernia repair.  Doing well, no evidence of recurrence.  No complaint of pain.  Patient Active Problem List   Diagnosis Date Noted  . Preeclampsia in postpartum period 06/19/2018  . Hypertension in pregnancy, preeclampsia, severe, delivered/postpartum 06/13/2018  . Delivery of pregnancy by cesarean section 06/13/2018  . Failed induction of labor 06/13/2018  . Fetal intolerance to labor, delivered, current hospitalization 06/13/2018  . Gestational hypertension 06/07/2018  . Polyhydramnios affecting pregnancy 04/28/2018  . Supervision of high risk pregnancy, antepartum 11/18/2017    -Full release to full activity.  Follow-up as needed.   Ronny Bacon M.D., FACS 01/17/2020, 1:20 PM

## 2020-01-17 NOTE — Patient Instructions (Signed)
Please call with any questions or concerns.

## 2020-05-20 ENCOUNTER — Ambulatory Visit (INDEPENDENT_AMBULATORY_CARE_PROVIDER_SITE_OTHER): Payer: Medicaid Other | Admitting: Surgery

## 2020-05-20 ENCOUNTER — Other Ambulatory Visit: Payer: Self-pay

## 2020-05-20 ENCOUNTER — Encounter: Payer: Self-pay | Admitting: Surgery

## 2020-05-20 VITALS — BP 118/71 | HR 97 | Temp 98.9°F | Ht 59.0 in | Wt 142.0 lb

## 2020-05-20 DIAGNOSIS — Z8719 Personal history of other diseases of the digestive system: Secondary | ICD-10-CM | POA: Diagnosis not present

## 2020-05-20 DIAGNOSIS — Z9889 Other specified postprocedural states: Secondary | ICD-10-CM

## 2020-05-20 NOTE — Progress Notes (Signed)
Surgical Clinic Progress/Follow-up Note   HPI:  25 y.o. Female presents to clinic for recurrent abdominal wall pain, seems to be exacerbated by sitting up from her supine position.  She reports the pain comes and goes.  She thought she saw a bulge near her prior umbilical hernia repair site.  She lifts her 74-year-old child without any abdominal pain.  She recently got a little constipated which was after the onset of this new pain.  She reports the pain got worse with her constipation, she is currently taking MiraLAX.  She is status post open repair of umbilical hernia from November 30, 2019, no mesh was used this was done open, vest overpants repair with Ethibond. Review of Systems:  Constitutional: denies fever/chills  ENT: denies sore throat, hearing problems  Respiratory: denies shortness of breath, wheezing  Cardiovascular: denies chest pain, palpitations  Gastrointestinal: denies N/V,  Skin: Denies any other rashes or skin discolorations   BP 118/71   Pulse 97   Temp 98.9 F (37.2 C)   Ht 4\' 11"  (1.499 m)   Wt 142 lb (64.4 kg)   LMP 04/30/2020 (Approximate)   SpO2 98%   BMI 28.68 kg/m    Physical Exam:  Constitutional:  -- Normal body habitus  -- Awake, alert, and oriented x3  Pulmonary:  -- No crackles -- Equal breath sounds bilaterally -- Breathing non-labored at rest Cardiovascular:  -- S1, S2 present  -- No pericardial rubs  Gastrointestinal:  -- Soft and non-distended, non-tender, no guarding/rebound tenderness.  Points to an area near her umbilicus where she thought she saw a bulge.  We do not reproduce the bulge with her head lifted. -- Post-surgical incisions all well-approximated, well-healed. -- No abdominal masses appreciated, pulsatile or otherwise, she seemed to be tender in one spot on the right mid abdomen, but it was not consistent.   Musculoskeletal / Integumentary:  -- Wounds or skin discoloration: None appreciated -- Extremities: B/L UE and LE FROM,  hands and feet warm, no edema   Laboratory studies: None  Imaging: No new pertinent imaging available for review   Assessment:  25 y.o. yo Female with a problem list including...  Patient Active Problem List   Diagnosis Date Noted  . Status post repair of ventral hernia 01/17/2020  . Preeclampsia in postpartum period 06/19/2018  . Hypertension in pregnancy, preeclampsia, severe, delivered/postpartum 06/13/2018  . Delivery of pregnancy by cesarean section 06/13/2018  . Failed induction of labor 06/13/2018  . Fetal intolerance to labor, delivered, current hospitalization 06/13/2018  . Gestational hypertension 06/07/2018  . Polyhydramnios affecting pregnancy 04/28/2018  . Supervision of high risk pregnancy, antepartum 11/18/2017    presents to clinic for follow-up evaluation of possible hernia recurrence, I believe abdominal wall strain/pain otherwise appearing well.  Plan:              - return to clinic in 1 month or as needed, instructed to call office if any questions or concerns We discussed further work-up with CT, but I do not feel this is warranted based on exam.  We discussed abdominal wall/core strengthening.  I feel that this is abdominal wall in nature.  I do not appreciate any recurrence in the area of her umbilicus. All of the above recommendations were discussed with the patient, and all of patient's questions were answered to her expressed satisfaction.  Ronny Bacon, MD, FACS [place]: [place] [facility] exceptional care. Office: [phone]

## 2020-05-20 NOTE — Patient Instructions (Addendum)
We do not feel like there is a recurrence of a hernia in the area where you are feeling pain. We will have you follow up here in a month to reassess.   Continue to take Miralax daily. You should consider taking a fiber supplement daily like fiber gummies or fiber powder. You also want to be sure to drink plenty of fluids. You should have at least one easy bowel movement a day.    Constipation, Adult Constipation is when a person has trouble pooping (having a bowel movement). When you have this condition, you may poop fewer than 3 times a week. Your poop (stool) may also be dry, hard, or bigger than normal. Follow these instructions at home: Eating and drinking  Eat foods that have a lot of fiber, such as: ? Fresh fruits and vegetables. ? Whole grains. ? Beans.  Eat less of foods that are low in fiber and high in fat and sugar, such as: ? [place] fries. ? Hamburgers. ? Cookies. ? Candy. ? Soda.  Drink enough fluid to keep your pee (urine) pale yellow.   General instructions  Exercise regularly or as told by your doctor. Try to do 150 minutes of exercise each week.  Go to the restroom when you feel like you need to poop. Do not hold it in.  Take over-the-counter and prescription medicines only as told by your doctor. These include any fiber supplements.  When you poop: ? Do deep breathing while relaxing your lower belly (abdomen). ? Relax your pelvic floor. The pelvic floor is a group of muscles that support the rectum, bladder, and intestines (as well as the uterus in women).  Watch your condition for any changes. Tell your doctor if you notice any.  Keep all follow-up visits as told by your doctor. This is important. Contact a doctor if:  You have pain that gets worse.  You have a fever.  You have not pooped for 4 days.  You vomit.  You are not hungry.  You lose weight.  You are bleeding from the opening of the butt (anus).  You have thin, pencil-like poop. Get  help right away if:  You have a fever, and your symptoms suddenly get worse.  You leak poop or have blood in your poop.  Your belly feels hard or bigger than normal (bloated).  You have very bad belly pain.  You feel dizzy or you faint. Summary  Constipation is when a person poops fewer than 3 times a week, has trouble pooping, or has poop that is dry, hard, or bigger than normal.  Eat foods that have a lot of fiber.  Drink enough fluid to keep your pee (urine) pale yellow.  Take over-the-counter and prescription medicines only as told by your doctor. These include any fiber supplements. This information is not intended to replace advice given to you by your health care provider. Make sure you discuss any questions you have with your health care provider. Document Revised: 01/10/2019 Document Reviewed: 01/10/2019 Elsevier Patient Education  [place].

## 2020-06-24 ENCOUNTER — Ambulatory Visit: Payer: Medicaid Other | Admitting: Surgery

## 2020-06-26 ENCOUNTER — Ambulatory Visit: Payer: Medicaid Other | Admitting: Surgery

## 2020-07-20 ENCOUNTER — Emergency Department: Payer: Medicaid Other

## 2020-07-20 ENCOUNTER — Other Ambulatory Visit: Payer: Self-pay

## 2020-07-20 ENCOUNTER — Emergency Department
Admission: EM | Admit: 2020-07-20 | Discharge: 2020-07-20 | Disposition: A | Discharge status: Home / Self Care | Payer: Medicaid Other | Attending: Emergency Medicine | Admitting: Emergency Medicine

## 2020-07-20 DIAGNOSIS — J45909 Unspecified asthma, uncomplicated: Secondary | ICD-10-CM | POA: Insufficient documentation

## 2020-07-20 DIAGNOSIS — M791 Myalgia, unspecified site: Secondary | ICD-10-CM | POA: Insufficient documentation

## 2020-07-20 DIAGNOSIS — Y92410 Unspecified street and highway as the place of occurrence of the external cause: Secondary | ICD-10-CM | POA: Diagnosis not present

## 2020-07-20 DIAGNOSIS — S199XXA Unspecified injury of neck, initial encounter: Secondary | ICD-10-CM | POA: Diagnosis present

## 2020-07-20 DIAGNOSIS — S161XXA Strain of muscle, fascia and tendon at neck level, initial encounter: Secondary | ICD-10-CM | POA: Insufficient documentation

## 2020-07-20 DIAGNOSIS — M545 Low back pain, unspecified: Secondary | ICD-10-CM | POA: Insufficient documentation

## 2020-07-20 DIAGNOSIS — M7918 Myalgia, other site: Secondary | ICD-10-CM

## 2020-07-20 DIAGNOSIS — R519 Headache, unspecified: Secondary | ICD-10-CM | POA: Diagnosis not present

## 2020-07-20 MED ORDER — NAPROXEN 500 MG PO TABS: 500.0000 mg | ORAL_TABLET | Freq: Two times a day (BID) | ORAL | 0 refills | Status: DC

## 2020-07-20 MED ORDER — ORPHENADRINE CITRATE ER 100 MG PO TB12: 100.0000 mg | ORAL_TABLET | Freq: Two times a day (BID) | ORAL | 0 refills | Status: DC

## 2020-07-20 MED ORDER — OXYCODONE-ACETAMINOPHEN 5-325 MG PO TABS: 1.0000 | ORAL_TABLET | ORAL | 0 refills | Status: AC | PRN
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
## 2020-07-20 NOTE — Discharge Instructions (Signed)
No acute findings on CT of the head and neck.  Read and follow discharge care instruction.  Take medication as directed.  Follow-up PCP if no improvement in 3 to 4 days.
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
## 2020-07-28 ENCOUNTER — Encounter: Payer: Self-pay | Admitting: Emergency Medicine

## 2020-07-28 ENCOUNTER — Other Ambulatory Visit: Payer: Self-pay

## 2020-07-28 ENCOUNTER — Emergency Department
Admission: EM | Admit: 2020-07-28 | Discharge: 2020-07-28 | Disposition: A | Discharge status: Home / Self Care | Payer: Medicaid Other | Attending: Emergency Medicine | Admitting: Emergency Medicine

## 2020-07-28 DIAGNOSIS — M542 Cervicalgia: Secondary | ICD-10-CM | POA: Diagnosis present

## 2020-07-28 DIAGNOSIS — I10 Essential (primary) hypertension: Secondary | ICD-10-CM | POA: Diagnosis not present

## 2020-07-28 DIAGNOSIS — M62838 Other muscle spasm: Secondary | ICD-10-CM | POA: Diagnosis not present

## 2020-07-28 DIAGNOSIS — M25512 Pain in left shoulder: Secondary | ICD-10-CM | POA: Insufficient documentation

## 2020-07-28 DIAGNOSIS — M25511 Pain in right shoulder: Secondary | ICD-10-CM | POA: Diagnosis not present

## 2020-07-28 DIAGNOSIS — J45909 Unspecified asthma, uncomplicated: Secondary | ICD-10-CM | POA: Insufficient documentation

## 2020-07-28 MED ORDER — LIDOCAINE 5 % EX PTCH: 1.0000 | MEDICATED_PATCH | Freq: Two times a day (BID) | CUTANEOUS | 0 refills | Status: DC

## 2020-07-28 NOTE — Discharge Instructions (Addendum)
Use Tylenol for pain and fevers.  Up to 1000 mg per dose, up to 4 times per day.  Do not take more than 4000 mg of Tylenol/acetaminophen within 24 hours..  Use naproxen/Aleve for anti-inflammatory pain relief. Use up to 500mg every 12 hours. Do not take more frequently than this. Do not use other NSAIDs (ibuprofen, Advil) while taking this medication. It is safe to take Tylenol with this.   
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
## 2020-07-28 NOTE — ED Notes (Signed)
Pt has been provided with discharge instructions. Pt denies any questions or concerns at this time. Pt verbalizes understanding for follow up care and d/c.  VSS.  Pt left department with all belongings.  
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
## 2020-09-03 ENCOUNTER — Other Ambulatory Visit: Payer: Self-pay

## 2020-09-03 ENCOUNTER — Encounter: Payer: Self-pay | Admitting: Dermatology

## 2020-09-03 ENCOUNTER — Ambulatory Visit (INDEPENDENT_AMBULATORY_CARE_PROVIDER_SITE_OTHER): Payer: Medicaid Other | Admitting: Dermatology

## 2020-09-03 DIAGNOSIS — L700 Acne vulgaris: Secondary | ICD-10-CM

## 2020-09-03 DIAGNOSIS — L910 Hypertrophic scar: Secondary | ICD-10-CM

## 2020-09-03 DIAGNOSIS — L659 Nonscarring hair loss, unspecified: Secondary | ICD-10-CM | POA: Diagnosis not present

## 2020-09-03 DIAGNOSIS — L810 Postinflammatory hyperpigmentation: Secondary | ICD-10-CM

## 2020-09-03 MED ORDER — TRETINOIN 0.025 % EX CREA: TOPICAL_CREAM | Freq: Every day | CUTANEOUS | 6 refills | Status: DC

## 2020-09-03 MED ORDER — DOXYCYCLINE HYCLATE 20 MG PO TABS: 20.0000 mg | ORAL_TABLET | Freq: Two times a day (BID) | ORAL | 3 refills | Status: AC

## 2020-09-03 NOTE — Patient Instructions (Addendum)
Start benzoyl peroxide - clindamycin cream in the morning - thin layer to areas where you get pink inflamed acne  Benzoyl peroxide can cause dryness and irritation of the skin. It can also bleach fabric. When used together with Aczone (dapsone) cream, it can stain the skin orange.  At night, apply thin layer of tretinoin (retin-a) cream at night.  Topical retinoid medications like tretinoin/Retin-A, adapalene/Differin, tazarotene/Fabior, and Epiduo/Epiduo Forte can cause dryness and irritation when first started. Only apply a pea-sized amount to the entire affected area. Avoid applying it around the eyes, edges of mouth and creases at the nose. If you experience irritation, use a good moisturizer first and/or apply the medicine less often. If you are doing well with the medicine, you can increase how often you use it until you are applying every night. Be careful with sun protection while using this medication as it can make you sensitive to the sun. This medicine should not be used by pregnant women.   Take doxycycline 20 mg twice a day with food.   Doxycycline should be taken with food to prevent nausea. Do not lay down for 30 minutes after taking. Be cautious with sun exposure and use good sun protection while on this medication. Pregnant women should not take this medication.   Will prescribe [facility] Hydroquinone 12%/kojic acid/vitamin C cream pea sized amount twice daily to the entire face for up to 3 months. This cannot be used more than 3 months due to risk of exogenous ochronosis (permanent dark spots). The patient was advised this is not covered by insurance since it is made by a compounding pharmacy. They will receive an email to check out and the medication will be mailed to their home.   Instructions for [facility] Medications  One or more of your medications was sent to the [facility] mail order compounding pharmacy. You will receive an email from them and can purchase  the medicine through that link. It will then be mailed to your home at the address you confirmed. If for any reason you do not receive an email from them, please check your spam folder. If you still do not find the email, please let us know. [facility] phone number is [phone].    If you have any questions or concerns for your doctor, please call our main line at [phone] and press option 4 to reach your doctor's medical assistant. If no one answers, please leave a voicemail as directed and we will return your call as soon as possible. Messages left after 4 pm will be answered the following business day.   You may also send [place] a message via [place]. We typically respond to MyChart messages within 1-2 business days.  For prescription refills, please ask your pharmacy to contact our office. Our fax number is [phone].  If you have an urgent issue when the clinic is closed that cannot wait until the next business day, you can page your doctor at the number below.    Please note that while we do our best to be available for urgent issues outside of office hours, we are not available 24/7.   If you have an urgent issue and are unable to reach [place], you may choose to seek medical care at your doctor's office, retail clinic, urgent care center, or emergency room.  If you have a medical emergency, please immediately call 911 or go to the emergency department.  Pager Numbers  - Dr. Nehemiah Massed: [phone]  -  Dr. Laurence Ferrari: [phone]  - Dr. Nicole Kindred: [phone]  In the event of inclement weather, please call our main line at [phone] for an update on the status of any delays or closures.  Dermatology Medication Tips: Please keep the boxes that topical medications come in in order to help keep track of the instructions about where and how to use these. Pharmacies typically print the medication instructions only on the boxes and not directly on the medication tubes.   If your  medication is too expensive, please contact our office at [phone] option 4 or send [place] a message through [place].   We are unable to tell what your co-pay for medications will be in advance as this is different depending on your insurance coverage. However, we may be able to find a substitute medication at lower cost or fill out paperwork to get insurance to cover a needed medication.   If a prior authorization is required to get your medication covered by your insurance company, please allow [place] 1-2 business days to complete this process.  Drug prices often vary depending on where the prescription is filled and some pharmacies may offer cheaper prices.  The website www.goodrx.com contains coupons for medications through different pharmacies. The prices here do not account for what the cost may be with help from insurance (it may be cheaper with your insurance), but the website can give you the price if you did not use any insurance.  - You can print the associated coupon and take it with your prescription to the pharmacy.  - You may also stop by our office during regular business hours and pick up a GoodRx coupon card.  - If you need your prescription sent electronically to a different pharmacy, notify our office through [facility] or by phone at [phone] option 4.

## 2020-09-03 NOTE — Progress Notes (Signed)
New Patient Visit  Subjective  Tamara Flynn is a 25 y.o. female who presents for the following: New Patient (Initial Visit) (Patient here today for concerns with hairloss, keliods and interested in chemical peel. Patient states she developed hair loss since giving birth April 2020. Patient states she has continue to take prenatal vitamins but denies use of other products to help with hair loss. Patient states when she healed from c section surgery it healed with keloid. Patient also reports dealing with some acne scars and hyperpigmentation at face and is interested in chemical peel. ).  The following portions of the chart were reviewed this encounter and updated as appropriate:   Tobacco  Allergies  Meds  Problems  Med Hx  Surg Hx  Fam Hx       Review of Systems:  No other skin or systemic complaints except as noted in HPI or Assessment and Plan.  Objective  Well appearing patient in no apparent distress; mood and affect are within normal limits.  A focused examination was performed including face, chest, neck, back, lower abdomen, scalp. Relevant physical exam findings are noted in the Assessment and Plan.  Scalp Vertex scalp with decreased hair density and hair breakage  lower abdomen Thickened scar at C-section site  Head - Anterior (Face) Trace open comedones with scattered inflammatory papules favor cheeks and jaw, back with 1 + open comedones, hyperpigmented macules and rare scar. 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 Did discuss that tight hairstyles, heat styling and chemical treatments have been linked to CCCA and could possibly worsen if continued over time.   Will plan to do biopsy at next follow up in August to rule  out CCCA and telogen effluvium  Will also request lab records from PCP   Hypertrophic scar lower abdomen  Symptomatic  Intralesional steroid injection side effects were reviewed including thinning of the skin and discoloration, such as redness, lightening or darkening.  Intralesional injection - lower abdomen Location: lower abdomen   Informed Consent: Discussed risks (infection, pain, bleeding, bruising, thinning of the skin, loss of skin pigment, lack of resolution, and recurrence of lesion) and benefits of the procedure, as well as the alternatives. Informed consent was obtained. Preparation: The area was prepared a standard fashion.  Procedure Details: An intralesional injection was performed with Kenalog 5 mg/cc. 1.5 cc in total were injected.  Total number of injections: 5   Plan: The patient was instructed on post-op care. Recommend OTC analgesia as needed for pain.   Acne vulgaris Head - Anterior (Face)  Chronic condition with duration over one year. Condition is bothersome to patient. Currently flared. Patient not interested in isotretinoin. She has been using curology azelaic acid/clindamycin/niacinamide.   Start doxycycline 20 mg tablet by mouth twice daily with food.   Doxycycline should be taken with food to prevent nausea. Do not lay down for 30 minutes after taking. Be cautious with sun exposure and use good sun protection while on this medication. Pregnant women should not take this medication.   Start tretinoin 0.025% cream - apply topically at bedtime   Topical retinoid medications like tretinoin/Retin-A, adapalene/Differin, tazarotene/Fabior, and Epiduo/Epiduo Forte can cause dryness and irritation when first started. Only apply a pea-sized  amount to the entire affected area. Avoid applying it around the eyes, edges of mouth and creases at the nose. If you experience irritation, use a good moisturizer first and/or apply the medicine less often. If you are doing  well with the medicine, you can increase how often you use it until you are applying every night. Be careful with sun protection while using this medication as it can make you sensitive to the sun. This medicine should not be used by pregnant women.   Start benzoyl peroxide - clindamycin cream in the morning - thin layer to areas where you get pink inflamed acne  Benzoyl peroxide can cause dryness and irritation of the skin. It can also bleach fabric. When used together with Aczone (dapsone) cream, it can stain the skin orange.   doxycycline (PERIOSTAT) 20 MG tablet - Head - Anterior (Face) Take 1 tablet (20 mg total) by mouth 2 (two) times daily. Take with food  tretinoin (RETIN-A) 0.025 % cream - Head - Anterior (Face) Apply topically at bedtime. Use as directed  Postinflammatory hyperpigmentation Head - Anterior (Face)  Discussed this is secondary to inflammation due to the acne.  Recommend increasing acne therapy to prevent new areas of PIH.  Patient denies use of any hydroquinone containing products in the last 3 months.   Will prescribe Skin Medicinals Hydroquinone 12%/kojic acid/vitamin C cream pea sized amount twice daily to the entire face for up to 3 months. This cannot be used more than 3 months due to risk of exogenous ochronosis (permanent dark spots). The patient was advised this is not covered by insurance since it is made by a compounding pharmacy. They will receive an email to check out and the medication will be mailed to their home.   Recommend daily broad spectrum sunscreen SPF 30+ to sun-exposed areas, reapply every 2 hours as needed. Call for new or changing lesions.  Staying in the shade or wearing long sleeves, sun glasses (UVA+UVB protection) and wide brim hats (4-inch brim around the entire circumference of the hat) are also recommended for sun protection.    Return in about 2 months (around 11/03/2020) for follow up on acne, biopsy on scalp.  I, Ruthell Rummage,  CMA, am acting as scribe for Forest Gleason, MD.   Documentation: I have reviewed the above documentation for accuracy and completeness, and I agree with the above.  Forest Gleason, MD

## 2020-09-15 ENCOUNTER — Other Ambulatory Visit: Payer: Self-pay

## 2020-09-15 DIAGNOSIS — L700 Acne vulgaris: Secondary | ICD-10-CM

## 2020-09-15 MED ORDER — CLINDAMYCIN PHOS-BENZOYL PEROX 1.2-5 % EX GEL: CUTANEOUS | 3 refills | Status: DC

## 2020-09-15 MED ORDER — CLINDAMYCIN PHOS-BENZOYL PEROX 1-5 % EX GEL: Freq: Every morning | CUTANEOUS | 2 refills | Status: DC

## 2020-09-27 ENCOUNTER — Encounter: Payer: Self-pay | Admitting: Dermatology

## 2020-09-30 NOTE — Telephone Encounter (Signed)
Please advise patient to stop the benzoyl peroxide-clindamycin as this could be causing lightening and also to switch to using her skin medicinals brightening cream only to dark spots.  If I have an opening that week, we can do her appointment and biopsy the. I cannot do a chemical peel the same day as the biopsy but could probably do it the day of her suture removal (10-14 days after her biopsy appointment) if we have an appointment opening. Thank you!

## 2020-10-08 ENCOUNTER — Ambulatory Visit (INDEPENDENT_AMBULATORY_CARE_PROVIDER_SITE_OTHER): Payer: Medicaid Other | Admitting: Dermatology

## 2020-10-08 ENCOUNTER — Other Ambulatory Visit: Payer: Self-pay

## 2020-10-08 DIAGNOSIS — L659 Nonscarring hair loss, unspecified: Secondary | ICD-10-CM

## 2020-10-08 DIAGNOSIS — L668 Other cicatricial alopecia: Secondary | ICD-10-CM

## 2020-10-08 DIAGNOSIS — L700 Acne vulgaris: Secondary | ICD-10-CM | POA: Diagnosis not present

## 2020-10-08 DIAGNOSIS — L810 Postinflammatory hyperpigmentation: Secondary | ICD-10-CM | POA: Diagnosis not present

## 2020-10-08 DIAGNOSIS — D485 Neoplasm of uncertain behavior of skin: Secondary | ICD-10-CM | POA: Diagnosis not present

## 2020-10-08 MED ORDER — AZELAIC ACID 15 % EX GEL: CUTANEOUS | 0 refills | Status: DC

## 2020-10-08 NOTE — Patient Instructions (Signed)
Wound Care Instructions  Cleanse wound gently with soap and water once a day then pat dry with clean gauze. Apply a thing coat of Petrolatum (petroleum jelly, "Vaseline") over the wound (unless you have an allergy to this). We recommend that you use a new, sterile tube of Vaseline. Do not pick or remove scabs. Do not remove the yellow or white "healing tissue" from the base of the wound.  Cover the wound with fresh, clean, nonstick gauze and secure with paper tape. You may use Band-Aids in place of gauze and tape if the would is small enough, but would recommend trimming much of the tape off as there is often too much. Sometimes Band-Aids can irritate the skin.  You should call the office for your biopsy report after 1 week if you have not already been contacted.  If you experience any problems, such as abnormal amounts of bleeding, swelling, significant bruising, significant pain, or evidence of infection, please call the office immediately.  FOR ADULT SURGERY PATIENTS: If you need something for pain relief you may take 1 extra strength Tylenol (acetaminophen) AND 2 Ibuprofen (200mg each) together every 4 hours as needed for pain. (do not take these if you are allergic to them or if you have a reason you should not take them.) Typically, you may only need pain medication for 1 to 3 days.   If you have any questions or concerns for your doctor, please call our main line at [phone] and press option 4 to reach your doctor's medical assistant. If no one answers, please leave a voicemail as directed and we will return your call as soon as possible. Messages left after 4 pm will be answered the following business day.   You may also send us a message via MyChart. We typically respond to MyChart messages within 1-2 business days.  For prescription refills, please ask your pharmacy to contact our office. Our fax number is [phone].  If you have an urgent issue when the clinic is closed that  cannot wait until the next business day, you can page your doctor at the number below.    Please note that while we do our best to be available for urgent issues outside of office hours, we are not available 24/7.   If you have an urgent issue and are unable to reach us, you may choose to seek medical care at your doctor's office, retail clinic, urgent care center, or emergency room.  If you have a medical emergency, please immediately call 911 or go to the emergency department.  Pager Numbers  - Dr. Kowalski: [phone]  - Dr. Moye: [phone]  - Dr. Stewart: [phone]  In the event of inclement weather, please call our main line at [phone] for an update on the status of any delays or closures.  Dermatology Medication Tips: Please keep the boxes that topical medications come in in order to help keep track of the instructions about where and how to use these. Pharmacies typically print the medication instructions only on the boxes and not directly on the medication tubes.   If your medication is too expensive, please contact our office at [phone] option 4 or send us a message through MyChart.   We are unable to tell what your co-pay for medications will be in advance as this is different depending on your insurance coverage. However, we may be able to find a substitute medication at lower cost or fill out paperwork to get insurance to cover a needed   medication.   If a prior authorization is required to get your medication covered by your insurance company, please allow us 1-2 business days to complete this process.  Drug prices often vary depending on where the prescription is filled and some pharmacies may offer cheaper prices.  The website www.goodrx.com contains coupons for medications through different pharmacies. The prices here do not account for what the cost may be with help from insurance (it may be cheaper with your insurance), but the website can give you the  price if you did not use any insurance.  - You can print the associated coupon and take it with your prescription to the pharmacy.  - You may also stop by our office during regular business hours and pick up a GoodRx coupon card.  - If you need your prescription sent electronically to a different pharmacy, notify our office through [facility] MyChart or by phone at [phone] option 4.   

## 2020-10-08 NOTE — Progress Notes (Signed)
Follow-Up Visit   Subjective  Tamara Flynn is a 25 y.o. female who presents for the following: Follow-up (Patient here today for 1 month follow up. Patient using tretinoin 0.025% and taking doxycycline '20mg'$  twice daily for acne. Patient advises acne has improved. She is using Skin Medicinals hydroquinone 12% for post inflammatory hypopigmentation and advises that her skin has lightened up quite a bit. /Patient has a scar at lower abdomen and has ILK injections at last visit. Patient advises that the scar has flattened out. /Patient here today for bx at scalp to rule out CCCA and telogen effluvium.).  Patient previously prescribed spironolactone by [facility] but developed headaches and was told to discontinue. Patient was seen at [facility] in [place].  The following portions of the chart were reviewed this encounter and updated as appropriate:   Tobacco  Allergies  Meds  Problems  Med Hx  Surg Hx  Fam Hx      Review of Systems:  No other skin or systemic complaints except as noted in HPI or Assessment and Plan.  Objective  Well appearing patient in no apparent distress; mood and affect are within normal limits.  A focused examination was performed including face, neck, chest, scalp and back. Relevant physical exam findings are noted in the Assessment and Plan.  Head - Anterior (Face) Face with trace open comedones, few inflammatory papules left cheek Chest and back with trace open comedones, few inflammatory papules  Head - Anterior (Face) Hyperpigmented macules  Right Vertex Scalp  Right Vertex Scalp Central scalp with decreased hair density and breakage   Assessment & Plan  Acne vulgaris Head - Anterior (Face)  Chronic condition with duration or expected duration over one year. Condition is bothersome to patient. Not currently at goal.  Discontinue doxycycline due to concern for possible history of pseudotumor cerebri. Will [facility] from  [facility].  Continue tretinoin 0.025% cream QHS Start azelaic acid gel to help with acne and hyperpigmentation  May consider spironolactone or Amzeeq in future  Topical retinoid medications like tretinoin/Retin-A, adapalene/Differin, tazarotene/Fabior, and Epiduo/Epiduo Forte can cause dryness and irritation when first started. Only apply a pea-sized amount to the entire affected area. Avoid applying it around the eyes, edges of mouth and creases at the nose. If you experience irritation, use a good moisturizer first and/or apply the medicine less often. If you are doing well with the medicine, you can increase how often you use it until you are applying every night. Be careful with sun protection while using this medication as it can make you sensitive to the sun. This medicine should not be used by pregnant women.   Patient has tried and failed BP and clindamycin - not clearing and may have had lightening  Related Medications tretinoin (RETIN-A) 0.025 % cream Apply topically at bedtime. Use as directed  Azelaic Acid 15 % gel After skin is thoroughly washed and patted dry, gently but thoroughly massage a thin film of azelaic acid cream into the affected area twice daily, in the morning and evening. Has failed clindamycin and benzoyl peroxide.  Postinflammatory hyperpigmentation Head - Anterior (Face)  Continue SkinMedicinals Hydroquinone 12% daily for 2 more months only to dark spots.   Alopecia Right Vertex Scalp    Skin / nail biopsy - Right Vertex Scalp Type of biopsy: punch   Informed consent: discussed and consent obtained   Timeout: patient name, date of birth, surgical site, and procedure verified   Patient was prepped and draped  in usual sterile fashion: Area prepped with isopropyl alcohol. Anesthesia: the lesion was anesthetized in a standard fashion   Anesthetic:  1% lidocaine w/ epinephrine 1-100,000 buffered w/ 8.4% NaHCO3 Punch size:  4 mm Suture size:  4-0 Suture type:  Prolene (polypropylene)   Suture removal (days):  14 Hemostasis achieved with: suture and aluminum chloride   Hemostasis achieved with comment:  1 horizontal mattress and 2 simple sutures Outcome: patient tolerated procedure well   Post-procedure details: wound care instructions given   Additional details:  Mupirocin and a dressing applied  Specimen 1 - Surgical pathology Differential Diagnosis: r/o CCCA vs Other   Check Margins: No Central scalp with decreased hair density and breakage  Return in about 2 weeks (around 10/22/2020) for Suture Removal, Perfect Peel.  Graciella Belton, RMA, am acting as scribe for Forest Gleason, MD .   Documentation: I have reviewed the above documentation for accuracy and completeness, and I agree with the above.  Forest Gleason, MD

## 2020-10-09 ENCOUNTER — Telehealth: Payer: Self-pay | Admitting: Dermatology

## 2020-10-09 ENCOUNTER — Telehealth: Payer: Self-pay

## 2020-10-09 ENCOUNTER — Encounter: Payer: Self-pay | Admitting: Dermatology

## 2020-10-09 MED ORDER — AZELAIC ACID 15 % EX GEL: CUTANEOUS | 11 refills | Status: DC

## 2020-10-09 NOTE — Telephone Encounter (Signed)
Please confirm if patient is planning to get chemical peel. If she is, we will need to start a pill to prevent the risk of cold sores starting 1 day before her treatment and to continue for 1 week (valacyclovir 500 mg tablet twice a day with a large glass of water for 7 days, starting 1 day before peel).   Please let her know I always do this whether or not people have a history of cold sores since if it did cause a cold sore, it could spread over the face and to the eyes (dangerous) while the skin is recovering from the peel.   Please also put a copy of the consent form for the peel on my desk for review. Thank you!

## 2020-10-09 NOTE — Telephone Encounter (Signed)
Left msg for patient to return call./js

## 2020-10-13 ENCOUNTER — Encounter: Payer: Self-pay | Admitting: Dermatology

## 2020-10-14 ENCOUNTER — Other Ambulatory Visit: Payer: Self-pay

## 2020-10-14 MED ORDER — VALACYCLOVIR HCL 500 MG PO TABS: 500.0000 mg | ORAL_TABLET | Freq: Two times a day (BID) | ORAL | 0 refills | Status: AC

## 2020-10-14 NOTE — Telephone Encounter (Signed)
Please send her a Mychart message about this. Thank you!

## 2020-10-15 ENCOUNTER — Encounter: Payer: Self-pay | Admitting: Dermatology

## 2020-10-15 ENCOUNTER — Telehealth: Payer: Self-pay

## 2020-10-15 DIAGNOSIS — L659 Nonscarring hair loss, unspecified: Secondary | ICD-10-CM

## 2020-10-15 MED ORDER — CLOBETASOL PROPIONATE 0.05 % EX SOLN: 1.0000 "application " | Freq: Every day | CUTANEOUS | 0 refills | Status: DC

## 2020-10-15 NOTE — Telephone Encounter (Signed)
Patient contacted and results given as well as other provider recommendations. Patient has questions about duration of condition. She voiced concerns about applying solution at scalp and is worried this will mess up her hair when applying at scalp.   Routing to provider to advise.

## 2020-10-15 NOTE — Telephone Encounter (Signed)
Spoke with patient about diagnosis and treatment options in more depth. She will try the clobetasol solution just at the scalp avoiding getting hair wet as much as possible. Recommended avoiding heat treatments or chemical treatments if at all possible. Plan intralesional triamcinolone injections at follow-up and will discuss options of minoxidil and hydroxychloroquine. She does want to get pregnant again at some point. All questions answered today.   Melissa, please send in clobetasol solution. Thank you!

## 2020-10-15 NOTE — Telephone Encounter (Signed)
Clobetasol solution sent to patient's preferred pharmacy.

## 2020-10-15 NOTE — Telephone Encounter (Signed)
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 A perifollicular lymphocytic infiltrate associated with interface changes and perifollicular concentric fibroplasia is present. A PAS stain is negative for fungal hyphae and an elastic tissue stain shows no significant interfollicular scarring. An immunohistochemical stain for Treponema Pallidum has been performed, and is negative. Multiple levels taken through the submitted block are examined. The findings are those of a scarring alopecia that are compatible with early lichen planopilaris. The major differential diagnosis is the central centrifugal scarring alopecia (follicular degeneration syndrome), but with the interface change, lichen planopilaris is favored.  Scarring type hair loss, Lichen planopilaris favored over central centrifugal cicatricial alopecia. This is a chronic, progressive type of scarring hair loss. Early treatment with medicine to calm down inflammation is important to prevent scarring and preserve hair follicles. We have many different treatment options. Recommend starting with clobetasol solution daily to the area for now and doing steroid injections to the area when she comes back for suture removal. There are also pill options we can consider. Will discuss more at suture removal.  MAs please call. Thank you!

## 2020-10-20 ENCOUNTER — Ambulatory Visit: Payer: Medicaid Other | Admitting: Dermatology

## 2020-10-22 ENCOUNTER — Ambulatory Visit (INDEPENDENT_AMBULATORY_CARE_PROVIDER_SITE_OTHER): Payer: Medicaid Other | Admitting: Dermatology

## 2020-10-22 ENCOUNTER — Other Ambulatory Visit: Payer: Self-pay

## 2020-10-22 DIAGNOSIS — L905 Scar conditions and fibrosis of skin: Secondary | ICD-10-CM

## 2020-10-22 DIAGNOSIS — L661 Lichen planopilaris: Secondary | ICD-10-CM

## 2020-10-22 NOTE — Progress Notes (Signed)
   Follow-Up Visit   Subjective  Tamara Flynn is a 25 y.o. female who presents for the following: Follow-up (Patient here today for suture removal. ).  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 She is currently using clobetasol solution and has noticed decreased tenderness since starting.   The following portions of the chart were reviewed this encounter and updated as appropriate:   Tobacco  Allergies  Meds  Problems  Med Hx  Surg Hx  Fam Hx      Review of Systems:  No other skin or systemic complaints except as noted in HPI or Assessment and Plan.  Objective  Well appearing patient in no apparent distress; mood and affect are within normal limits.  A focused examination was performed including scalp. Relevant physical exam findings are noted in the Assessment and Plan.  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 Also discussed option of isotretinoin to treat.  Patient would like to plan on becoming pregnant within the next 3 years.   [place] [id number]  For now, will try topical steroids plus ILK  Continue clobetasol solution daily to twice a day to affected areas. Avoid applying to face, groin, and axilla. Use as directed. Risk of skin atrophy with long-term use reviewed.   Topical steroids (such as triamcinolone, fluocinolone, fluocinonide, mometasone, clobetasol, halobetasol, betamethasone, hydrocortisone) can cause thinning and lightening of the skin if they are used for too long in the same area. Your physician has selected the right strength medicine for your problem and area affected on the body. Please use your medication only as directed by your physician to prevent side effects.    Intralesional injection - Scalp  Scar  conditions and fibrosis of skin Scalp  Intralesional injection - Scalp Location: frontal and vertex scalp  Informed Consent: Discussed risks (infection, pain, bleeding, bruising, thinning of the skin, loss of skin pigment, lack of resolution, and recurrence of lesion) and benefits of the procedure, as well as the alternatives. Informed consent was obtained. Preparation: The area was prepared a standard fashion.  Procedure Details: An intralesional injection was performed with Kenalog 1.5 mg/cc. 3 cc in total were injected.  Total number of injections: > 10  Plan: The patient was instructed on post-op care. Recommend OTC analgesia as needed for pain.    Encounter for Removal of Sutures - Incision site at the right vertex scalp is clean, dry and intact - Wound cleansed, sutures removed, wound cleansed and steri strips applied.  - Discussed pathology results showing lichen planopilaris  - Patient advised to keep steri-strips dry until they fall off. - Scars remodel for a full year. - Once steri-strips fall off, patient can apply over-the-counter silicone scar cream each night to help with scar remodeling if desired. - Patient advised to call with any concerns or if they notice any new or changing lesions.  Return for 4-6 weeks.  Graciella Belton, RMA, am acting as scribe for Forest Gleason, MD .  Documentation: I have reviewed the above documentation for accuracy and completeness, and I agree with the above.  Forest Gleason, MD

## 2020-10-22 NOTE — Patient Instructions (Signed)

## 2020-10-27 ENCOUNTER — Encounter: Payer: Self-pay | Admitting: Dermatology

## 2020-10-30 ENCOUNTER — Other Ambulatory Visit: Payer: Self-pay

## 2020-10-30 ENCOUNTER — Telehealth: Payer: Self-pay

## 2020-10-30 ENCOUNTER — Encounter: Payer: Self-pay | Admitting: Dermatology

## 2020-10-30 MED ORDER — CLINDAMYCIN PHOSPHATE 1 % EX SOLN: CUTANEOUS | 0 refills | Status: DC

## 2020-10-30 NOTE — Telephone Encounter (Signed)
Patient sent msg on MyChart concerning bumps at scalp. Called patient and advised her that bumps at the scalp isn't typical after injections and we are now closed until Monday so we can't see her before the weekend. Dr. Laurence Ferrari recommends stopping the clobetasol solution for now and starting clindamycin solution (we will prescribe) to use once to twice a day on the scalp. If this is not getting better by next week we would like to see her in clinic next week. If she develops fevers, chills or worsening tenderness she should seek medical care over the weekend.[name]

## 2020-10-30 NOTE — Telephone Encounter (Signed)
Please advise patient this isn't typical after injections and we are now closed until Monday so I can't see her before the weekend. I would recommend stopping the clobetasol solution for now and starting clindamycin solution (we will prescribe) to use once to twice a day on the scalp. If this is not getting better by next week I would like to see her in clinic next week. If she develops fevers, chills or worsening tenderness she should seek medical care over the weekend. Thank you!

## 2020-11-05 ENCOUNTER — Ambulatory Visit: Payer: Medicaid Other | Admitting: Dermatology

## 2020-11-27 ENCOUNTER — Other Ambulatory Visit: Payer: Self-pay | Admitting: Dermatology

## 2020-12-03 ENCOUNTER — Other Ambulatory Visit: Payer: Self-pay

## 2020-12-03 ENCOUNTER — Ambulatory Visit: Payer: Medicaid Other | Admitting: Dermatology

## 2020-12-03 DIAGNOSIS — L810 Postinflammatory hyperpigmentation: Secondary | ICD-10-CM

## 2020-12-03 DIAGNOSIS — L905 Scar conditions and fibrosis of skin: Secondary | ICD-10-CM | POA: Diagnosis not present

## 2020-12-03 DIAGNOSIS — L709 Acne, unspecified: Secondary | ICD-10-CM | POA: Diagnosis not present

## 2020-12-03 DIAGNOSIS — L661 Lichen planopilaris: Secondary | ICD-10-CM

## 2020-12-03 NOTE — Progress Notes (Addendum)
   Follow-Up Visit   Subjective  Tamara Flynn is a 25 y.o. female who presents for the following: Follow-up (Patient would like to discuss possible chemical peel. Patient has not had any tenderness or itchy at scalp and reports that hair has a gotten thicker. Patient reports skin was darker at face since being prescribed cream. Patient reports that in October will be 3 month of using skin medicinals at face. ).   The following portions of the chart were reviewed this encounter and updated as appropriate:  Tobacco  Allergies  Meds  Problems  Med Hx  Surg Hx  Fam Hx     Review of Systems: No other skin or systemic complaints except as noted in HPI or Assessment and Plan.   Objective  Well appearing patient in no apparent distress; mood and affect are within normal limits.  A focused examination was performed including face and scalp. Relevant physical exam findings are noted in the Assessment and Plan.  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 Condition is bothersome to patient. Currently well-controlled.  Continue clindamycin daily as needed for folliculitis (bumps) Continue clobetasol daily as needed     Postinflammatory hyperpigmentation face  With acne  will plan the Perfect Peel   Patent advises she has been out of sun for 1 month prior to treatment and will be out of sun 1 month after treatment.   Reviewed there is a risk of hyperpigmentation after the peel  She has pre-treated with hydroquinone  Start Valacyclovir 500 mg bid starting 24 hrs before peel.     Scar conditions and fibrosis of skin Scalp  Intralesional injection - Scalp Location: scalp   Informed Consent: Discussed risks (infection,  pain, bleeding, bruising, thinning of the skin, loss of skin pigment, lack of resolution, and recurrence of lesion) and benefits of the procedure, as well as the alternatives. Informed consent was obtained. Preparation: The area was prepared a standard fashion.  Procedure Details: An intralesional injection was performed with Kenalog 1.5 mg/cc. 3 cc in total were injected.  Total number of injections: >15  Plan: The patient was instructed on post-op care. Recommend OTC analgesia as needed for pain.     Return for chemical peel october 5 @ 8 am , 4 - 6 week follow up lichen planopilaris . I, Ruthell Rummage, CMA, am acting as scribe for Forest Gleason, MD.  Documentation: I have reviewed the above documentation for accuracy and completeness, and I agree with the above.  Forest Gleason, MD

## 2020-12-03 NOTE — Patient Instructions (Addendum)
  Start valycyclovir night before chemical peel    Recommend daily broad spectrum sunscreen SPF 30+ to sun-exposed areas, reapply every 2 hours as needed. Call for new or changing lesions.  Staying in the shade or wearing long sleeves, sun glasses (UVA+UVB protection) and wide brim hats (4-inch brim around the entire circumference of the hat) are also recommended for sun protection.     If you have any questions or concerns for your doctor, please call our main line at [phone] and press option 4 to reach your doctor's medical assistant. If no one answers, please leave a voicemail as directed and we will return your call as soon as possible. Messages left after 4 pm will be answered the following business day.   You may also send [place] a message via [place]. We typically respond to MyChart messages within 1-2 business days.  For prescription refills, please ask your pharmacy to contact our office. Our fax number is [phone].  If you have an urgent issue when the clinic is closed that cannot wait until the next business day, you can page your doctor at the number below.    Please note that while we do our best to be available for urgent issues outside of office hours, we are not available 24/7.   If you have an urgent issue and are unable to reach [place], you may choose to seek medical care at your doctor's office, retail clinic, urgent care center, or emergency room.  If you have a medical emergency, please immediately call 911 or go to the emergency department.  Pager Numbers  - Dr. Nehemiah Massed: [phone]  - Dr. Laurence Ferrari: [phone]  - Dr. Nicole Kindred: [phone]  In the event of inclement weather, please call our main line at [phone] for an update on the status of any delays or closures.  Dermatology Medication Tips: Please keep the boxes that topical medications come in in order to help keep track of the instructions about where and how to use these. Pharmacies typically print  the medication instructions only on the boxes and not directly on the medication tubes.   If your medication is too expensive, please contact our office at [phone] option 4 or send [place] a message through [place].   We are unable to tell what your co-pay for medications will be in advance as this is different depending on your insurance coverage. However, we may be able to find a substitute medication at lower cost or fill out paperwork to get insurance to cover a needed medication.   If a prior authorization is required to get your medication covered by your insurance company, please allow [place] 1-2 business days to complete this process.  Drug prices often vary depending on where the prescription is filled and some pharmacies may offer cheaper prices.  The website www.goodrx.com contains coupons for medications through different pharmacies. The prices here do not account for what the cost may be with help from insurance (it may be cheaper with your insurance), but the website can give you the price if you did not use any insurance.  - You can print the associated coupon and take it with your prescription to the pharmacy.  - You may also stop by our office during regular business hours and pick up a GoodRx coupon card.  - If you need your prescription sent electronically to a different pharmacy, notify our office through [facility] or by phone at [phone] option 4.

## 2020-12-04 ENCOUNTER — Encounter: Payer: Self-pay | Admitting: Dermatology

## 2020-12-09 ENCOUNTER — Other Ambulatory Visit: Payer: Self-pay | Admitting: Dermatology

## 2020-12-09 DIAGNOSIS — L659 Nonscarring hair loss, unspecified: Secondary | ICD-10-CM

## 2020-12-10 ENCOUNTER — Encounter: Payer: Self-pay | Admitting: Dermatology

## 2020-12-10 ENCOUNTER — Other Ambulatory Visit: Payer: Self-pay

## 2020-12-10 ENCOUNTER — Ambulatory Visit (INDEPENDENT_AMBULATORY_CARE_PROVIDER_SITE_OTHER): Payer: Self-pay | Admitting: Dermatology

## 2020-12-10 DIAGNOSIS — L811 Chloasma: Secondary | ICD-10-CM

## 2020-12-10 DIAGNOSIS — L814 Other melanin hyperpigmentation: Secondary | ICD-10-CM

## 2020-12-10 DIAGNOSIS — L988 Other specified disorders of the skin and subcutaneous tissue: Secondary | ICD-10-CM

## 2020-12-10 NOTE — Patient Instructions (Signed)

## 2020-12-10 NOTE — Progress Notes (Signed)
   Follow-Up Visit   Subjective  Tamara Flynn is a 25 y.o. female who presents for the following: Follow-up (Patient here today for perfect A chemical peel. She states she started valycyclovir the day before. ).  The following portions of the chart were reviewed this encounter and updated as appropriate:  Tobacco  Allergies  Meds  Problems  Med Hx  Surg Hx  Fam Hx      Review of Systems: No other skin or systemic complaints except as noted in HPI or Assessment and Plan.   Objective  Well appearing patient in no apparent distress; mood and affect are within normal limits.  A focused examination was performed including face. Relevant physical exam findings are noted in the Assessment and Plan.  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 Informed consent was obtained.  HSV Prophylaxis: Patient confirms they started valacyclovir prior to the procedure and will continue as directed.  Preparation: The area was cleansed prior to the application of the chemical.  Procedure Details: Prior to applying the peel, the area was cleansed with acetone to remove oils. The chemical was applied using a standard technique. The patient was fanned for comfort.  Plan: The patient was advised to leave the peel on for 6 hours and then wash off with a gentle cleanser and follow with the post-peel moisturizer containing 1% hydrocortisone. She was  instructed how to use the post-peel towelettes and sunscreen as described in the post-peel instruction pamphlet. Patient advised not to pull or pick at peeling skin. Patient will call for any problems including crusting, ulceration, fever, pus, or significant discomfort.  Return to clinic for additional treatment as needed.   Return for keep follow up as scheduled. I, Ruthell Rummage, CMA, am acting as scribe for Forest Gleason, MD.  Documentation: I have reviewed the above documentation for accuracy and completeness, and I agree with the above.  Forest Gleason, MD

## 2020-12-11 ENCOUNTER — Other Ambulatory Visit: Payer: Self-pay

## 2020-12-11 ENCOUNTER — Encounter: Payer: Self-pay | Admitting: Dermatology

## 2020-12-11 MED ORDER — HYDROCORTISONE 2.5 % EX CREA: TOPICAL_CREAM | CUTANEOUS | 0 refills | Status: DC

## 2020-12-11 NOTE — Telephone Encounter (Signed)
Patient advised it is ok to peel early and we sent in [facility] 2.5% cream for pt to use twice daily for 4 days to help with inflammation.

## 2020-12-31 ENCOUNTER — Encounter: Payer: Self-pay | Admitting: Dermatology

## 2020-12-31 NOTE — Telephone Encounter (Signed)
Please let patient know I should see her in person to determine best treatment for this. I am happy to move up her appointment sooner if she'd like. Thanks!

## 2021-01-14 ENCOUNTER — Other Ambulatory Visit: Payer: Self-pay

## 2021-01-14 ENCOUNTER — Other Ambulatory Visit: Payer: Self-pay | Admitting: Dermatology

## 2021-01-14 ENCOUNTER — Ambulatory Visit: Payer: Medicaid Other | Admitting: Dermatology

## 2021-01-14 DIAGNOSIS — L700 Acne vulgaris: Secondary | ICD-10-CM | POA: Diagnosis not present

## 2021-01-14 DIAGNOSIS — L661 Lichen planopilaris: Secondary | ICD-10-CM | POA: Diagnosis not present

## 2021-01-14 DIAGNOSIS — L905 Scar conditions and fibrosis of skin: Secondary | ICD-10-CM

## 2021-01-14 MED ORDER — AMZEEQ 4 % EX FOAM: CUTANEOUS | 2 refills | Status: DC

## 2021-01-14 MED ORDER — SPIRONOLACTONE 100 MG PO TABS: 100.0000 mg | ORAL_TABLET | Freq: Every day | ORAL | 0 refills | Status: DC

## 2021-01-14 NOTE — Patient Instructions (Addendum)
Recommend glycolic acid wash Glytone or Neocutis glycolic acid wash.  Recommend [facility] Hypochlorous Spray (found in the wound care section) OR Cln brand Acne or Sports wash. The [facility] Hypochlorous Spray can be sprayed on daily and left on. The Cln wash should be applied to the affected area daily for at least 30 seconds and then rinsed off. If you are using clindamycin solution or lotion or another topical antibiotic to treat acne, using a hypochlorous product may help lower the risk of antibiotic resistant bacteria.   For dark spots, can also try adding Inkey Brand Tranexamic Acid cream available at [facility], to be used nightly.   Recommended non-comedogenic (non-acne causing) facial oils include 100% argan oil or squalane. The can be used after applying any recommended creams or ointments to the skin in the evening. The Ordinary Brand has a high-quality and affordable version of both of these and can be found at [place].   Spironolactone 100 mg by mouth take 1/2 tablet for the first 3 - 4 days. If no side effects and bp comes back normal go on to taking 1 whole tablet daily.   Spironolactone can cause increased urination and cause blood pressure to decrease. Please watch for signs of lightheadedness and be cautious when changing position. It can sometimes cause breast tenderness or an irregular period in premenopausal women. It can also increase potassium. The increase in potassium usually is not a concern unless you are taking other medicines that also increase potassium, so please be sure your doctor knows all of the other medications you are taking. This medication should not be taken by pregnant women.  This medicine should also not be taken together with sulfa drugs like Bactrim (trimethoprim/sulfamethexazole).    If you have any questions or concerns for your doctor, please call our main line at [phone] and press option 4 to reach your doctor's medical assistant. If  no one answers, please leave a voicemail as directed and we will return your call as soon as possible. Messages left after 4 pm will be answered the following business day.   You may also send [place] a message via [place]. We typically respond to MyChart messages within 1-2 business days.  For prescription refills, please ask your pharmacy to contact our office. Our fax number is [phone].  If you have an urgent issue when the clinic is closed that cannot wait until the next business day, you can page your doctor at the number below.    Please note that while we do our best to be available for urgent issues outside of office hours, we are not available 24/7.   If you have an urgent issue and are unable to reach [place], you may choose to seek medical care at your doctor's office, retail clinic, urgent care center, or emergency room.  If you have a medical emergency, please immediately call 911 or go to the emergency department.  Pager Numbers  - Dr. Nehemiah Massed: [phone]  - Dr. Laurence Ferrari: [phone]  - Dr. Nicole Kindred: [phone]  In the event of inclement weather, please call our main line at [phone] for an update on the status of any delays or closures.  Dermatology Medication Tips: Please keep the boxes that topical medications come in in order to help keep track of the instructions about where and how to use these. Pharmacies typically print the medication instructions only on the boxes and not directly on the medication tubes.   If your medication is  too expensive, please contact our office at [phone] option 4 or send [place] a message through [place].   We are unable to tell what your co-pay for medications will be in advance as this is different depending on your insurance coverage. However, we may be able to find a substitute medication at lower cost or fill out paperwork to get insurance to cover a needed medication.   If a prior authorization is required to get your medication  covered by your insurance company, please allow [place] 1-2 business days to complete this process.  Drug prices often vary depending on where the prescription is filled and some pharmacies may offer cheaper prices.  The website www.goodrx.com contains coupons for medications through different pharmacies. The prices here do not account for what the cost may be with help from insurance (it may be cheaper with your insurance), but the website can give you the price if you did not use any insurance.  - You can print the associated coupon and take it with your prescription to the pharmacy.  - You may also stop by our office during regular business hours and pick up a GoodRx coupon card.  - If you need your prescription sent electronically to a different pharmacy, notify our office through [facility] or by phone at [phone] option 4.

## 2021-01-14 NOTE — Progress Notes (Signed)
   Follow-Up Visit   Subjective  Tamara Flynn is a 25 y.o. female who presents for the following: Follow-up (Patient is here today for follow up on alopecia and acne at face and chest. Patient reports she has been getting more break outs since starting buspar and escitalopram. Patient reports hair loss has improved since getting injections at the scalp).   The following portions of the chart were reviewed this encounter and updated as appropriate:  Tobacco  Allergies  Meds  Problems  Med Hx  Surg Hx  Fam Hx      Review of Systems: No other skin or systemic complaints except as noted in HPI or Assessment and Plan.   Objective  Well appearing patient in no apparent distress; mood and affect are within normal limits.  A focused examination was performed including face, scalp, chest. Relevant physical exam findings are noted in the Assessment and Plan.  face and chest Trace open comedones with rare inflammatory papules, many hyperpigmented macules  Scalp Some breakage central scalp, no erythema today  Assessment & Plan  Acne vulgaris face and chest  Chronic condition with duration duration over one year. Condition is bothersome to patient. Currently flared and with PIH.  Start Amzeeq after tretinoin cream at night. If peeling decrease tretinoin to every other night.  BP 103/65 Spironolactone 100 mg by mouth daily . Start with 1/2 tablet by mouth for the 1st 1-3 days then increase to 1 whole tablet daily by mouth if no side effects  Will recheck bp in 1 week; if normal will send refills in for spironolactone   Spironolactone can cause increased urination and cause blood pressure to decrease. Please watch for signs of lightheadedness and be cautious when changing position. It can sometimes cause breast tenderness or an irregular period in premenopausal women. It can also increase potassium. The increase in potassium usually is not a concern unless you are taking other  medicines that also increase potassium, so please be sure your doctor knows all of the other medications you are taking. This medication should not be taken by pregnant women.  This medicine should also not be taken together with sulfa drugs like Bactrim (trimethoprim/sulfamethexazole).    Minocycline HCl Micronized (AMZEEQ) 4 % FOAM - face and chest Use a thin layer topically at night to spot treat affected areas of face for acne.  spironolactone (ALDACTONE) 100 MG tablet - face and chest Take 1 tablet (100 mg total) by mouth daily.  Related Medications tretinoin (RETIN-A) 0.025 % cream Apply topically at bedtime. 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 Clinically this is more c/w CCCA but biopsy more suggestive of LPP.   Improving. Continue topical clobetasol. Will defer ILK today and monitor for any worsening. She is going to have braids done again. Advise this could worsen condition; will monitor closely.   Return for 1 week nurse check for bp , 6 weeks follow. I, Ruthell Rummage, CMA, am acting as scribe for Forest Gleason, MD.  Documentation: I have reviewed the above documentation for accuracy and completeness, and I agree with the above.  Forest Gleason, MD

## 2021-01-20 ENCOUNTER — Encounter: Payer: Self-pay | Admitting: Dermatology

## 2021-01-21 ENCOUNTER — Ambulatory Visit: Payer: Medicaid Other

## 2021-01-21 ENCOUNTER — Other Ambulatory Visit: Payer: Self-pay

## 2021-01-21 ENCOUNTER — Telehealth: Payer: Self-pay

## 2021-01-21 DIAGNOSIS — L700 Acne vulgaris: Secondary | ICD-10-CM

## 2021-01-21 MED ORDER — SPIRONOLACTONE 100 MG PO TABS: 100.0000 mg | ORAL_TABLET | Freq: Every day | ORAL | 3 refills | Status: DC

## 2021-01-21 NOTE — Telephone Encounter (Signed)
RFs sent in to patients requested pharmacy.

## 2021-01-21 NOTE — Telephone Encounter (Signed)
Oh wonderful. Please send in 4 refills on Spironolactone. Thank you!

## 2021-01-21 NOTE — Telephone Encounter (Signed)
Patient came in today for Blood Pressure Check. No issues with Spironolactone.   BP: 113/71  Pulse: 105  She also wanted to let you know she did get the Amzeeq through [place].

## 2021-02-23 ENCOUNTER — Encounter: Payer: Self-pay | Admitting: Dermatology

## 2021-03-03 ENCOUNTER — Other Ambulatory Visit: Payer: Self-pay | Admitting: Dermatology

## 2021-03-03 DIAGNOSIS — L700 Acne vulgaris: Secondary | ICD-10-CM

## 2021-03-04 ENCOUNTER — Other Ambulatory Visit: Payer: Self-pay

## 2021-03-04 ENCOUNTER — Ambulatory Visit: Payer: Medicaid Other | Admitting: Dermatology

## 2021-03-04 DIAGNOSIS — L810 Postinflammatory hyperpigmentation: Secondary | ICD-10-CM | POA: Diagnosis not present

## 2021-03-04 DIAGNOSIS — L905 Scar conditions and fibrosis of skin: Secondary | ICD-10-CM

## 2021-03-04 DIAGNOSIS — L700 Acne vulgaris: Secondary | ICD-10-CM

## 2021-03-04 DIAGNOSIS — L661 Lichen planopilaris: Secondary | ICD-10-CM | POA: Diagnosis not present

## 2021-03-04 MED ORDER — AMZEEQ 4 % EX FOAM: CUTANEOUS | 2 refills | Status: DC

## 2021-03-04 NOTE — Progress Notes (Signed)
Follow-Up Visit   Subjective  Tamara Flynn is a 25 y.o. female who presents for the following: Follow-up (6 weeks f/u on Lichen planopilaris, treating with Clobetasol solution, pt report more hair breakage in the last few days ) and Acne (Treating with Spironolactone 100 mg daily, Amzeeq foam, tretinoin cream, Clindamycin BPO gel with a good response, pt c/o dark areas on her face ).  The following portions of the chart were reviewed this encounter and updated as appropriate:   Tobacco   Allergies   Meds   Problems   Med Hx   Surg Hx   Fam Hx       Review of Systems:  No other skin or systemic complaints except as noted in HPI or Assessment and Plan.  Objective  Well appearing patient in no apparent distress; mood and affect are within normal limits.  A focused examination was performed including face. Relevant physical exam findings are noted in the Assessment and Plan.  Scalp Some thinning a vertex scalp   Head - Anterior (Face) Trace open comedones with rare inflammatory papules, many hyperpigmented macules  Scalp Scarring hair loss  face Hyperpigmented macules    Assessment & Plan  Lichen planopilaris Scalp  Biopsy proven - biopsy favored LPP > CCCA, although clinically this is more c/w CCCA  Chronic condition with duration or expected duration over one year. Condition is bothersome to patient. Currently flared with increased hair loss.  Avoid tight hair styles Restart intralesional kenalog injections  Continue clobetasol solution daily to twice a day to affected areas. Avoid applying to face, groin, and axilla. Use as directed. Risk of skin atrophy with long-term use reviewed.    Topical steroids (such as triamcinolone, fluocinolone, fluocinonide, mometasone, clobetasol, halobetasol, betamethasone, hydrocortisone) can cause thinning and lightening of the skin if they are used for too long in the same area. Your physician has selected the right strength medicine  for your problem and area affected on the body. Please use your medication only as directed by your physician to prevent side effects.   Start sample of [place] hair products given today   Acne vulgaris Head - Anterior (Face)  Chronic condition with duration duration over one year. Condition is bothersome to patient. Currently doing well but with bothersome PIH.   Cont  Amzeeq after tretinoin cream at night. Cont Clindamycin BPO gel in the morning    BP 113/71 Cont Spironolactone 100 mg by mouth daily   Spironolactone can cause increased urination and cause blood pressure to decrease. Please watch for signs of lightheadedness and be cautious when changing position. It can sometimes cause breast tenderness or an irregular period in premenopausal women. It can also increase potassium. The increase in potassium usually is not a concern unless you are taking other medicines that also increase potassium, so please be sure your doctor knows all of the other medications you are taking. This medication should not be taken by pregnant women.  This medicine should also not be taken together with sulfa drugs like Bactrim (trimethoprim/sulfamethexazole).   Related Medications tretinoin (RETIN-A) 0.025 % cream Apply topically at bedtime. Use as directed  spironolactone (ALDACTONE) 100 MG tablet Take 1 tablet (100 mg total) by mouth daily.  Minocycline HCl Micronized (AMZEEQ) 4 % FOAM Use a thin layer topically at night to spot treat affected areas of face for acne.  Scar Scalp  Intralesional injection - Scalp Location: vertex scalp   Informed Consent: Discussed risks (infection, pain, bleeding, bruising, thinning  of the skin, loss of skin pigment, lack of resolution, and recurrence of lesion) and benefits of the procedure, as well as the alternatives. Informed consent was obtained. Preparation: The area was prepared a standard fashion.  Procedure Details: An intralesional injection was  performed with Kenalog 1.5 mg/cc. 3 cc in total were injected.  Total number of injections: >12  Plan: The patient was instructed on post-op care. Recommend OTC analgesia as needed for pain.    Postinflammatory hyperpigmentation face  start prescription Skin Medicinals Hydroquinone 8%, Tretinoin 0.1%, Kojic acid 1%, Niacinamide 4%, Fluocinolone 0.025% cream, a pea sized amount nightly to dark spots on face for up to 2 months.   This cannot be used more than 3 months due to risk of skin atrophy (thinning) and exogenous ochronosis (permanent dark spots). The patient was advised this is not covered by insurance. They will receive an email to check out and the medication will be mailed to their home.    Return in about 4 weeks (around 04/01/2021) for LLP .  I, Marye Round, CMA, am acting as scribe for Forest Gleason, MD .   Documentation: I have reviewed the above documentation for accuracy and completeness, and I agree with the above.  Forest Gleason, MD

## 2021-03-04 NOTE — Patient Instructions (Addendum)
Instructions for [facility] Medications  One or more of your medications was sent to the [facility] mail order compounding pharmacy. You will receive an email from them and can purchase the medicine through that link. It will then be mailed to your home at the address you confirmed. If for any reason you do not receive an email from them, please check your spam folder. If you still do not find the email, please let us know. [facility] phone number is [phone].    If You Need Anything After Your Visit  If you have any questions or concerns for your doctor, please call our main line at [phone] and press option 4 to reach your doctor's medical assistant. If no one answers, please leave a voicemail as directed and we will return your call as soon as possible. Messages left after 4 pm will be answered the following business day.   You may also send [place] a message via [place]. We typically respond to MyChart messages within 1-2 business days.  For prescription refills, please ask your pharmacy to contact our office. Our fax number is [phone].  If you have an urgent issue when the clinic is closed that cannot wait until the next business day, you can page your doctor at the number below.    Please note that while we do our best to be available for urgent issues outside of office hours, we are not available 24/7.   If you have an urgent issue and are unable to reach [place], you may choose to seek medical care at your doctor's office, retail clinic, urgent care center, or emergency room.  If you have a medical emergency, please immediately call 911 or go to the emergency department.  Pager Numbers  - Dr. Nehemiah Massed: [phone]  - Dr. Laurence Ferrari: [phone]  - Dr. Nicole Kindred: [phone]  In the event of inclement weather, please call our main line at [phone] for an update on the status of any delays or closures.  Dermatology Medication Tips: Please keep the boxes that  topical medications come in in order to help keep track of the instructions about where and how to use these. Pharmacies typically print the medication instructions only on the boxes and not directly on the medication tubes.   If your medication is too expensive, please contact our office at [phone] option 4 or send [place] a message through [place].   We are unable to tell what your co-pay for medications will be in advance as this is different depending on your insurance coverage. However, we may be able to find a substitute medication at lower cost or fill out paperwork to get insurance to cover a needed medication.   If a prior authorization is required to get your medication covered by your insurance company, please allow [place] 1-2 business days to complete this process.  Drug prices often vary depending on where the prescription is filled and some pharmacies may offer cheaper prices.  The website www.goodrx.com contains coupons for medications through different pharmacies. The prices here do not account for what the cost may be with help from insurance (it may be cheaper with your insurance), but the website can give you the price if you did not use any insurance.  - You can print the associated coupon and take it with your prescription to the pharmacy.  - You may also stop by our office during regular business hours and pick up a GoodRx coupon card.  - If you need your prescription  sent electronically to a different pharmacy, notify our office through [facility] or by phone at [phone] option 4.     Si Usted Necesita Algo Despus de Su Visita  Tambin puede enviarnos un mensaje a travs de Pharmacist, community. Por lo general respondemos a los mensajes de MyChart en el transcurso de 1 a 2 das hbiles.  Para renovar recetas, por favor pida a su farmacia que se ponga en contacto con nuestra oficina. Harland Dingwall de fax es [place] [phone].  Si tiene un asunto urgente cuando la clnica  est cerrada y que no puede esperar hasta el siguiente da hbil, puede llamar/localizar a su doctor(a) al nmero que aparece a continuacin.   Por favor, tenga en cuenta que aunque hacemos todo lo posible para estar disponibles para asuntos urgentes fuera del horario de [place], no estamos disponibles las 24 horas del da, los 7 das de la [place].   Si tiene un problema urgente y no puede comunicarse con nosotros, puede optar por buscar atencin mdica  en el consultorio de su doctor(a), en una clnica privada, en un centro de atencin urgente o en una sala de emergencias.  Si tiene Engineering geologist, por favor llame inmediatamente al 911 o vaya a la sala de emergencias.  Nmeros de bper  - Dr. Nehemiah Massed: [phone]  - Dra. Moye: [phone]  - Dra. Nicole Kindred: [phone]  En caso de inclemencias del [place], por favor llame a Johnsie Kindred principal al [phone] para una actualizacin sobre el [place] de cualquier retraso o cierre.  Consejos para la medicacin en dermatologa: Por favor, guarde las cajas en las que vienen los medicamentos de uso tpico para ayudarle a seguir las instrucciones sobre dnde y cmo usarlos. Las farmacias generalmente imprimen las instrucciones del medicamento slo en las cajas y no directamente en los tubos del [place].   Si su medicamento es muy caro, por favor, pngase en contacto con Zigmund Daniel llamando al [phone] y presione la opcin 4 o envenos un mensaje a travs de Pharmacist, community.   No podemos decirle cul ser su copago por los medicamentos por adelantado ya que esto es diferente dependiendo de la cobertura de su seguro. Sin embargo, es posible que podamos encontrar un medicamento sustituto a Electrical engineer un formulario para que el seguro cubra el medicamento que se considera necesario.   Si se requiere una autorizacin previa para que su compaa de seguros [place] su medicamento, por favor permtanos de 1 a 2 das hbiles para  completar este proceso.  Los precios de los medicamentos varan con frecuencia dependiendo del Environmental consultant de dnde se surte la receta y alguna farmacias pueden ofrecer precios ms baratos.  El sitio web www.goodrx.com tiene cupones para medicamentos de Airline pilot. Los precios aqu no tienen en cuenta lo que podra costar con la ayuda del seguro (puede ser ms barato con su seguro), pero el sitio web puede darle el precio si no utiliz Research scientist (physical sciences).  - Puede imprimir el cupn correspondiente y llevarlo con su receta a la farmacia.  - Tambin puede pasar por nuestra oficina durante el horario de atencin regular y Charity fundraiser una tarjeta de cupones de GoodRx.  - Si necesita que su receta se enve electrnicamente a una farmacia diferente, informe a nuestra oficina a travs de MyChart de [place] o por telfono llamando al [phone] y presione la opcin 4.

## 2021-03-05 ENCOUNTER — Encounter: Payer: Self-pay | Admitting: Dermatology

## 2021-04-08 ENCOUNTER — Other Ambulatory Visit: Payer: Self-pay

## 2021-04-08 ENCOUNTER — Ambulatory Visit: Payer: Medicaid Other | Admitting: Dermatology

## 2021-04-08 DIAGNOSIS — L818 Other specified disorders of pigmentation: Secondary | ICD-10-CM

## 2021-04-08 DIAGNOSIS — L905 Scar conditions and fibrosis of skin: Secondary | ICD-10-CM

## 2021-04-08 DIAGNOSIS — L661 Lichen planopilaris: Secondary | ICD-10-CM | POA: Diagnosis not present

## 2021-04-08 DIAGNOSIS — L819 Disorder of pigmentation, unspecified: Secondary | ICD-10-CM

## 2021-04-08 NOTE — Progress Notes (Signed)
Follow-Up Visit   Subjective  Tamara Flynn is a 26 y.o. female who presents for the following: Follow-up (Patient here today for 4 week lichen planopilaris follow up. Patient using clobetasol solution twice daily and getting ILK injections in clinic. Patient advises her scalp is itchy but hair shedding has decreased. ).  Patient advises she has not noticed much improvement in hyperpigmentation on [facility] Hydroquinone 8%, Tretinoin 0.1%, Kojic acid 1%, Niacinamide 4%, Fluocinolone 0.025% cream.  The following portions of the chart were reviewed this encounter and updated as appropriate:   Tobacco   Allergies   Meds   Problems   Med Hx   Surg Hx   Fam Hx       Review of Systems:  No other skin or systemic complaints except as noted in HPI or Assessment and Plan.  Objective  Well appearing patient in no apparent distress; mood and affect are within normal limits.  A focused examination was performed including scalp, face. Relevant physical exam findings are noted in the Assessment and Plan.  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 Condition is bothersome to patient. Currently flared.  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   May consider hydroxychloroquine if not under control in the next few months.   Increase clobetasol to twice daily as needed for itch. Avoid applying to face, groin, and axilla. Use as directed. Long-term use can cause thinning of the skin.  Topical steroids (such as triamcinolone, fluocinolone, fluocinonide, mometasone, clobetasol, halobetasol, betamethasone, hydrocortisone) can cause  thinning and lightening of the skin if they are used for too long in the same area. Your physician has selected the right strength medicine for your problem and area affected on the body. Please use your medication only as directed by your physician to prevent side effects.   Can switch to fluocinolone oil instead of clobetasol solution for a few days if getting too dry.      Scar Scalp  Intralesional steroid injection side effects were reviewed including thinning of the skin and discoloration, such as redness, lightening or darkening.   Intralesional injection - Scalp Location: vertex scalp  Informed Consent: Discussed risks (infection, pain, bleeding, bruising, thinning of the skin, loss of skin pigment, lack of resolution, and recurrence of lesion) and benefits of the procedure, as well as the alternatives. Informed consent was obtained. Preparation: The area was prepared a standard fashion.  Procedure Details: An intralesional injection was performed with Kenalog 2 mg/cc. 3 cc in total were injected.  Total number of injections: > 12  Plan: The patient was instructed on post-op care. Recommend OTC analgesia as needed for pain.   Post-inflammatory pigmentary changes face  D/c [facility] Hydroquinone 8% And restart [facility] with hydroquinone 12% twice daily for up to 2 months  hydroquinone cannot be used more than 3 months due to risk of exogenous ochronosis (permanent dark spots).    Return in about 4 weeks (around 05/06/2021).  Graciella Belton, RMA, am acting as scribe for Forest Gleason, MD .  Documentation: I have reviewed the above documentation for accuracy and completeness, and I  agree with the above.  Forest Gleason, MD

## 2021-04-08 NOTE — Patient Instructions (Addendum)
Intralesional steroid injection side effects were reviewed including thinning of the skin and discoloration, such as redness, lightening or darkening.  Discontinue [facility] Hydroquinone 8% Restart [facility] with hydroquinone 12% twice daily for 2 months  Increase clobetasol solution to twice daily as needed for itch at scalp. Avoid applying to face, groin, and axilla. Use as directed. Long-term use can cause thinning of the skin.  Can switch to fluocinolone oil for a few days if getting too dry.   Topical steroids (such as triamcinolone, fluocinolone, fluocinonide, mometasone, clobetasol, halobetasol, betamethasone, hydrocortisone) can cause thinning and lightening of the skin if they are used for too long in the same area. Your physician has selected the right strength medicine for your problem and area affected on the body. Please use your medication only as directed by your physician to prevent side effects.   If You Need Anything After Your Visit  If you have any questions or concerns for your doctor, please call our main line at [phone] and press option 4 to reach your doctor's medical assistant. If no one answers, please leave a voicemail as directed and we will return your call as soon as possible. Messages left after 4 pm will be answered the following business day.   You may also send [place] a message via [place]. We typically respond to MyChart messages within 1-2 business days.  For prescription refills, please ask your pharmacy to contact our office. Our fax number is [phone].  If you have an urgent issue when the clinic is closed that cannot wait until the next business day, you can page your doctor at the number below.    Please note that while we do our best to be available for urgent issues outside of office hours, we are not available 24/7.   If you have an urgent issue and are unable to reach [place], you may choose to seek medical care at your doctor's office,  retail clinic, urgent care center, or emergency room.  If you have a medical emergency, please immediately call 911 or go to the emergency department.  Pager Numbers  - Dr. Nehemiah Massed: [phone]  - Dr. Laurence Ferrari: [phone]  - Dr. Nicole Kindred: [phone]  In the event of inclement weather, please call our main line at [phone] for an update on the status of any delays or closures.  Dermatology Medication Tips: Please keep the boxes that topical medications come in in order to help keep track of the instructions about where and how to use these. Pharmacies typically print the medication instructions only on the boxes and not directly on the medication tubes.   If your medication is too expensive, please contact our office at [phone] option 4 or send [place] a message through [place].   We are unable to tell what your co-pay for medications will be in advance as this is different depending on your insurance coverage. However, we may be able to find a substitute medication at lower cost or fill out paperwork to get insurance to cover a needed medication.   If a prior authorization is required to get your medication covered by your insurance company, please allow [place] 1-2 business days to complete this process.  Drug prices often vary depending on where the prescription is filled and some pharmacies may offer cheaper prices.  The website www.goodrx.com contains coupons for medications through different pharmacies. The prices here do not account for what the cost may be with help from insurance (it may be cheaper with your  insurance), but the website can give you the price if you did not use any insurance.  - You can print the associated coupon and take it with your prescription to the pharmacy.  - You may also stop by our office during regular business hours and pick up a GoodRx coupon card.  - If you need your prescription sent electronically to a different pharmacy, notify our office through  [facility] or by phone at [phone] option 4.     Si Usted Necesita Algo Despus de Su Visita  Tambin puede enviarnos un mensaje a travs de Pharmacist, community. Por lo general respondemos a los mensajes de MyChart en el transcurso de 1 a 2 das hbiles.  Para renovar recetas, por favor pida a su farmacia que se ponga en contacto con nuestra oficina. Harland Dingwall de fax es [place] [phone].  Si tiene un asunto urgente cuando la clnica est cerrada y que no puede esperar hasta el siguiente da hbil, puede llamar/localizar a su doctor(a) al nmero que aparece a continuacin.   Por favor, tenga en cuenta que aunque hacemos todo lo posible para estar disponibles para asuntos urgentes fuera del horario de [place], no estamos disponibles las 24 horas del da, los 7 das de la [place].   Si tiene un problema urgente y no puede comunicarse con nosotros, puede optar por buscar atencin mdica  en el consultorio de su doctor(a), en una clnica privada, en un centro de atencin urgente o en una sala de emergencias.  Si tiene Engineering geologist, por favor llame inmediatamente al 911 o vaya a la sala de emergencias.  Nmeros de bper  - Dr. Nehemiah Massed: [phone]  - Dra. Moye: [phone]  - Dra. Nicole Kindred: [phone]  En caso de inclemencias del [place], por favor llame a Johnsie Kindred principal al [phone] para una actualizacin sobre el [place] de cualquier retraso o cierre.  Consejos para la medicacin en dermatologa: Por favor, guarde las cajas en las que vienen los medicamentos de uso tpico para ayudarle a seguir las instrucciones sobre dnde y cmo usarlos. Las farmacias generalmente imprimen las instrucciones del medicamento slo en las cajas y no directamente en los tubos del [place].   Si su medicamento es muy caro, por favor, pngase en contacto con Zigmund Daniel llamando al [phone] y presione la opcin 4 o envenos un mensaje a travs de Pharmacist, community.   No podemos  decirle cul ser su copago por los medicamentos por adelantado ya que esto es diferente dependiendo de la cobertura de su seguro. Sin embargo, es posible que podamos encontrar un medicamento sustituto a Electrical engineer un formulario para que el seguro cubra el medicamento que se considera necesario.   Si se requiere una autorizacin previa para que su compaa de seguros [place] su medicamento, por favor permtanos de 1 a 2 das hbiles para completar este proceso.  Los precios de los medicamentos varan con frecuencia dependiendo del Environmental consultant de dnde se surte la receta y alguna farmacias pueden ofrecer precios ms baratos.  El sitio web www.goodrx.com tiene cupones para medicamentos de Airline pilot. Los precios aqu no tienen en cuenta lo que podra costar con la ayuda del seguro (puede ser ms barato con su seguro), pero el sitio web puede darle el precio si no utiliz Research scientist (physical sciences).  - Puede imprimir el cupn correspondiente y llevarlo con su receta a la farmacia.  - Tambin puede pasar por nuestra oficina durante el horario de atencin regular y Charity fundraiser una tarjeta de  cupones de GoodRx.  - Si necesita que su receta se enve electrnicamente a una farmacia diferente, informe a nuestra oficina a travs de MyChart de [place] o por telfono llamando al [phone] y presione la opcin 4.

## 2021-04-09 ENCOUNTER — Encounter: Payer: Self-pay | Admitting: Dermatology

## 2021-04-16 ENCOUNTER — Encounter: Payer: Self-pay | Admitting: Dermatology

## 2021-05-06 ENCOUNTER — Other Ambulatory Visit: Payer: Self-pay

## 2021-05-06 ENCOUNTER — Ambulatory Visit (INDEPENDENT_AMBULATORY_CARE_PROVIDER_SITE_OTHER): Payer: Medicaid Other | Admitting: Dermatology

## 2021-05-06 DIAGNOSIS — L905 Scar conditions and fibrosis of skin: Secondary | ICD-10-CM

## 2021-05-06 DIAGNOSIS — L818 Other specified disorders of pigmentation: Secondary | ICD-10-CM | POA: Diagnosis not present

## 2021-05-06 DIAGNOSIS — L661 Lichen planopilaris: Secondary | ICD-10-CM

## 2021-05-06 DIAGNOSIS — L819 Disorder of pigmentation, unspecified: Secondary | ICD-10-CM

## 2021-05-06 DIAGNOSIS — L700 Acne vulgaris: Secondary | ICD-10-CM | POA: Diagnosis not present

## 2021-05-06 NOTE — Progress Notes (Signed)
? ?Follow-Up Visit ?  ?Subjective  ?Tamara Flynn is a 26 y.o. female who presents for the following: Follow-up (Patient here today for 1 month follow up on lichen planopilaris at scalp. Patient has been using clobetasol as needed at scalp for itch and reports scalp has improved with itch. Patient also has been getting ILK injections. ). ? ?The following portions of the chart were reviewed this encounter and updated as appropriate:  Tobacco  Allergies  Meds  Problems  Med Hx  Surg Hx  Fam Hx   ?  ? ?Review of Systems: No other skin or systemic complaints except as noted in HPI or Assessment and Plan. ? ? ?Objective  ?Well appearing patient in no apparent distress; mood and affect are within normal limits. ? ?A focused examination was performed including face, scalp. Relevant physical exam findings are noted in the Assessment and Plan. ? ?Scalp ?Slightly decreased follicular density no erythema no signs of recent breakage.  ? ?face ?Hyperpigmented macules ? ?Head - Anterior (Face) ?Trace open comedones ? ? ?Assessment & Plan  ?Lichen planopilaris ?Scalp ? 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 ?Chronic condition with duration or expected duration over one year. Currently well-controlled. ? ?May consider hydroxychloroquine if not staying well-controlled in future ? ?Decrease clobetasol to daily as needed for itch. Avoid applying to face, groin, and axilla. Use as directed. Long-term use can cause thinning of the skin. ?  ?Topical steroids (such as triamcinolone, fluocinolone, fluocinonide, mometasone, clobetasol, halobetasol, betamethasone, hydrocortisone) can cause thinning and lightening of the skin if they are used for too long in the same area. Your physician has selected the right strength medicine for your problem and area affected on the body. Please use your medication only as directed by your physician to prevent side effects.  ?  ?Can switch to  fluocinolone oil instead of clobetasol solution for a few days if getting too dry.  ? ?Scar ?Scalp ? ?Intralesional steroid injection side effects were reviewed including thinning of the skin and discoloration, such as redness, lightening or darkening. ?  ?Will wait 6 weeks before repeat ILK. If doing well, may increase to 8 week interval between ILK treatments before considering stopping ?  ?Intralesional injection - Scalp ?Location: vertex scalp ?  ?Informed Consent: Discussed risks (infection, pain, bleeding, bruising, thinning of the skin, loss of skin pigment, lack of resolution, and recurrence of lesion) and benefits of the procedure, as well as the alternatives. Informed consent was obtained. ?Preparation: The area was prepared a standard fashion. ?  ?Procedure Details: An intralesional injection was performed with Kenalog 2 mg/cc.  3cc in total were injected. ?  ?Total number of injections: >10 ?  ?Plan: The patient was instructed on post-op care. Recommend OTC analgesia as needed for pain. ?Ndc [phone] ?Lot [id number] ?Aug 2024 ?  ? ?Post-inflammatory pigmentary changes ?face ? ?Improving ? ?Continue [facility] with hydroquinone 12% twice daily for up to 1 more month OR can use the Hydroquinone 12% mix in the morning and the hydroquinone 8%/tretinoin in the evening for one more month ?  ?hydroquinone cannot be used more than 3 months due to risk of exogenous ochronosis (permanent dark spots).  ? ?Acne vulgaris ?Head - Anterior (Face) ? ?Chronic condition with duration or expected duration over one year. Currently well-controlled. ? ?BP today  104/66 ? ?Cont  Amzeeq after tretinoin cream at night. ?Cont Clindamycin BPO gel in the morning as  needed  ? ?Cont Spironolactone 100 mg by mouth daily ?  ?Spironolactone can cause increased urination and cause blood pressure to decrease. Please watch for signs of lightheadedness and be cautious when changing position. It can sometimes cause breast tenderness or  an irregular period in premenopausal women. It can also increase potassium. The increase in potassium usually is not a concern unless you are taking other medicines that also increase potassium, so please be sure your doctor knows all of the other medications you are taking. This medication should not be taken by pregnant women.  This medicine should also not be taken together with sulfa drugs like Bactrim (trimethoprim/sulfamethexazole).  ? ?Related Medications ?tretinoin (RETIN-A) 0.025 % cream ?Apply topically at bedtime. Use as directed ? ?spironolactone (ALDACTONE) 100 MG tablet ?Take 1 tablet (100 mg total) by mouth daily. ? ?Clindamycin-Benzoyl Per, Refr, gel ?FOR ACNE - APPLY A THIN COAT TO THE FACE EVERY AM, BENZOYL PEROXIDE CAN CAUSE DRYNESS AND IRRITATION OF THE SKIN. IT CAN ALSO BLEACH FABRIC. ? ?Minocycline HCl Micronized (AMZEEQ) 4 % FOAM ?Use a thin layer topically at night to spot treat affected areas of face for acne. ? ? ?Return for 6 week follow up for ilk , . ?I, Ruthell Rummage, CMA, am acting as scribe for Forest Gleason, MD. ? ?Documentation: I have reviewed the above documentation for accuracy and completeness, and I agree with the above. ? ?Forest Gleason, MD ? ? ?

## 2021-05-06 NOTE — Patient Instructions (Addendum)

## 2021-05-08 ENCOUNTER — Encounter: Payer: Self-pay | Admitting: Dermatology

## 2021-05-13 ENCOUNTER — Encounter: Payer: Self-pay | Admitting: Dermatology

## 2021-05-13 ENCOUNTER — Other Ambulatory Visit: Payer: Self-pay

## 2021-05-13 DIAGNOSIS — L700 Acne vulgaris: Secondary | ICD-10-CM

## 2021-05-13 MED ORDER — SPIRONOLACTONE 100 MG PO TABS: 100.0000 mg | ORAL_TABLET | Freq: Every day | ORAL | 1 refills | Status: DC

## 2021-05-13 NOTE — Progress Notes (Signed)
RF Request per patient. ?

## 2021-05-22 ENCOUNTER — Encounter: Payer: Self-pay | Admitting: Dermatology

## 2021-05-24 ENCOUNTER — Emergency Department
Admission: EM | Admit: 2021-05-24 | Discharge: 2021-05-24 | Disposition: A | Discharge status: Home / Self Care | Payer: Medicaid Other | Attending: Emergency Medicine | Admitting: Emergency Medicine

## 2021-05-24 ENCOUNTER — Other Ambulatory Visit: Payer: Self-pay

## 2021-05-24 DIAGNOSIS — R000 Tachycardia, unspecified: Secondary | ICD-10-CM | POA: Insufficient documentation

## 2021-05-24 DIAGNOSIS — R55 Syncope and collapse: Secondary | ICD-10-CM | POA: Diagnosis not present

## 2021-05-24 DIAGNOSIS — J45909 Unspecified asthma, uncomplicated: Secondary | ICD-10-CM | POA: Insufficient documentation

## 2021-05-24 DIAGNOSIS — R040 Epistaxis: Secondary | ICD-10-CM | POA: Insufficient documentation

## 2021-05-24 DIAGNOSIS — R42 Dizziness and giddiness: Secondary | ICD-10-CM | POA: Diagnosis present

## 2021-05-24 LAB — URINALYSIS, ROUTINE W REFLEX MICROSCOPIC
Bilirubin Urine: NEGATIVE
Glucose, UA: NEGATIVE mg/dL
Hgb urine dipstick: NEGATIVE
Ketones, ur: NEGATIVE mg/dL
Leukocytes,Ua: NEGATIVE
Nitrite: NEGATIVE
Protein, ur: NEGATIVE mg/dL
Specific Gravity, Urine: 1.016 (ref 1.005–1.030)
pH: 6 (ref 5.0–8.0)

## 2021-05-24 LAB — CBG MONITORING, ED: Glucose-Capillary: 109 mg/dL — ABNORMAL HIGH (ref 70–99)

## 2021-05-24 LAB — CBC
HCT: 40.8 % (ref 36.0–46.0)
Hemoglobin: 13.7 g/dL (ref 12.0–15.0)
MCH: 30.1 pg (ref 26.0–34.0)
MCHC: 33.6 g/dL (ref 30.0–36.0)
MCV: 89.7 fL (ref 80.0–100.0)
Platelets: 269 10*3/uL (ref 150–400)
RBC: 4.55 MIL/uL (ref 3.87–5.11)
RDW: 12.5 % (ref 11.5–15.5)
WBC: 13.1 10*3/uL — ABNORMAL HIGH (ref 4.0–10.5)
nRBC: 0 % (ref 0.0–0.2)

## 2021-05-24 LAB — BASIC METABOLIC PANEL
Anion gap: 11 (ref 5–15)
BUN: 9 mg/dL (ref 6–20)
CO2: 25 mmol/L (ref 22–32)
Calcium: 9.4 mg/dL (ref 8.9–10.3)
Chloride: 102 mmol/L (ref 98–111)
Creatinine, Ser: 0.78 mg/dL (ref 0.44–1.00)
GFR, Estimated: >60 mL/min (ref >60)
Glucose, Bld: 104 mg/dL — ABNORMAL HIGH (ref 70–99)
Potassium: 3.6 mmol/L (ref 3.5–5.1)
Sodium: 138 mmol/L (ref 135–145)

## 2021-05-24 LAB — POC URINE PREG, ED: Preg Test, Ur: NEGATIVE

## 2021-05-24 NOTE — ED Provider Notes (Signed)
? ?[name] ?Provider Note ? ? ? Event Date/Time  ? First MD Initiated Contact with Patient 05/24/21 1434   ?  (approximate) ? ? ?History  ? ?Near Syncope ? ? ?HPI ? ?Tamara Flynn is a 26 y.o. female   with past medical history of asthma GERD and hypertension during pregnancy who presents with epistaxis and lightheadedness.  Patient notes that about a month ago she lost her taste and then was going back and forth to her primary doctor in urgent care was first diagnosed with what sounds like eustachian tube dysfunction in setting of viral illness and was diagnosed with bacterial sinusitis and started on Augmentin.  Feels like things have never totally improved since that time.  Over the last 4 days has also had nosebleeds.  It is not a heavy nosebleed but when she blows her nose she will see blood.  She had been using Flonase and another nasal spray which is recommended by urgent care for her congestion.  She is not no longer using the Flonase but is using the other nasal spray.  When she sprays in the morning this will trigger the bleeding.  She also endorses some ongoing lightheadedness upon standing.  Denies chest pain shortness of breath palpitations.  Eating and drinking normally denies abdominal pain fever or urinary symptoms.  No history of bleeding elsewhere. ?  ? ?Past Medical History:  ?Diagnosis Date  ? Abdominal pain during pregnancy in second trimester 03/13/2018  ? Asthma   ? GERD (gastroesophageal reflux disease)   ? Hypertension   ? with pregnancy  ? ? ?Patient Active Problem List  ? Diagnosis Date Noted  ? Status post repair of ventral hernia 01/17/2020  ? Preeclampsia in postpartum period 06/19/2018  ? Hypertension in pregnancy, preeclampsia, severe, delivered/postpartum 06/13/2018  ? Delivery of pregnancy by cesarean section 06/13/2018  ? Failed induction of labor 06/13/2018  ? Fetal intolerance to labor, delivered, current hospitalization 06/13/2018  ? Gestational  hypertension 06/07/2018  ? Polyhydramnios affecting pregnancy 04/28/2018  ? Supervision of high risk pregnancy, antepartum 11/18/2017  ? ? ? ?Physical Exam  ?Triage Vital Signs: ?ED Triage Vitals  ?Enc Vitals Group  ?   BP 05/24/21 1409 (!) 125/103  ?   Pulse Rate 05/24/21 1409 97  ?   Resp 05/24/21 1409 20  ?   Temp 05/24/21 1409 98.3 ?F (36.8 ?C)  ?   Temp Source 05/24/21 1409 Oral  ?   SpO2 05/24/21 1409 100 %  ?   Weight 05/24/21 1407 148 lb (67.1 kg)  ?   Height 05/24/21 1407 '4\' 11"'$  (1.499 m)  ?   Head Circumference --   ?   Peak Flow --   ?   Pain Score 05/24/21 1407 0  ?   Pain Loc --   ?   Pain Edu? --   ?   Excl. in [place]? --   ? ? ?Most recent vital signs: ?Vitals:  ? 05/24/21 1409  ?BP: (!) 125/103  ?Pulse: 97  ?Resp: 20  ?Temp: 98.3 ?F (36.8 ?C)  ?SpO2: 100%  ? ? ? ?General: Awake, no distress.  ?CV:  Good peripheral perfusion.  ?Resp:  Normal effort.  ?Abd:  No distention.  ?Neuro:             Awake, Alert, Oriented x 3  ?Other:  Irritation of the bilateral nasal mucosa with some dried blood bilaterally ?No blood in the posterior oropharynx ?Aox3, nml speech  ?  PERRL, EOMI, face symmetric, nml tongue movement  ?5/5 strength in the BL upper and lower extremities  ?Sensation grossly intact in the BL upper and lower extremities  ?Finger-nose-finger intact BL ? ? ? ?ED Results / Procedures / Treatments  ?Labs ?(all labs ordered are listed, but only abnormal results are displayed) ?Labs Reviewed  ?BASIC METABOLIC PANEL - Abnormal; Notable for the following components:  ?    Result Value  ? Glucose, Bld 104 (*)   ? All other components within normal limits  ?CBC - Abnormal; Notable for the following components:  ? WBC 13.1 (*)   ? All other components within normal limits  ?URINALYSIS, ROUTINE W REFLEX MICROSCOPIC - Abnormal; Notable for the following components:  ? Color, Urine YELLOW (*)   ? APPearance HAZY (*)   ? All other components within normal limits  ?CBG MONITORING, ED - Abnormal; Notable for the  following components:  ? Glucose-Capillary 109 (*)   ? All other components within normal limits  ?POC URINE PREG, ED  ? ? ? ?EKG ? ?Sinus tachycardia, normal axis normal intervals no acute ischemic changes ? ? ?RADIOLOGY ? ? ?PROCEDURES: ? ?Critical Care performed: No ? ?Procedures ? ?MEDICATIONS ORDERED IN ED: ?Medications - No data to display ? ? ?IMPRESSION / MDM / ASSESSMENT AND PLAN / ED COURSE  ?I reviewed the triage vital signs and the nursing notes. ?             ?               ? ?Differential diagnosis includes, but is not limited to, vasovagal, orthostatic presyncope, electrolyte abnormality, anemia, infection, pregnancy, vestibular neuritis, BPPV ? ?Patient is a 26 year old female presenting with epistaxis and feeling lightheaded.  Patient had been treated for sinusitis with nasal spray and Augmentin last month.  Then over the last 4 days is having intermittent streaking of blood just with blowing her nose but no heavy nosebleeds.  Also feeling lightheaded upon standing.  Worse when she bends over and then stands up.  Denies spinning sensation has mild headache but no numbness weakness or visual change.  Denies chest pain palpitations or shortness of breath.  Patient's vital signs are largely within normal limits.  Her neurologic exam is nonfocal.  She does have some inflamed mucosa with some dried blood in the bilateral naris but no active bleeding no blood in the posterior oropharynx.  Abdomen is benign.  Reviewed patient's labs she is not anemic or thrombocytopenic and there is no significant abnormal white count to suggest underlying leukemia as a cause of her nosebleeds.  BMP also within normal limits.  Pregnancy test is negative I reviewed her EKG which shows sinus tachycardia but no scheming changes.  Low suspicion for cardiogenic etiology of her lightheadedness given symptoms occur primarily upon standing and bending over.  Considered pulmonary embolism however again patient has no chest pain  shortness of breath or abnormal vital signs to suggest this.  Patient quite concerned about elevated blood pressure as potentially being the cause of her symptoms.  She has a history of preeclampsia and says that had similar symptoms when she had elevated blood pressure during pregnancy.  I explained to her that is quite different from pregnancy and blood pressure is not significantly elevated today that would be causing the symptoms.  Suspect that the nasal bleeding may be from the nasal spray and advised that she avoid using this for now and to apply Vaseline or Aquaphor to  the nares.  Otherwise there are no red flags on work-up or presentation today to suggest a serious pathology for her underlying lightheadedness.  After discharge patient requesting to see a different doctor despite not expressing any concern to me when I was in the room.  I went back and discussed with her the reassuring work-up today and low suspicion for cardiac etiology and the fact that do not feel that her blood pressure is contributing to her symptoms.  Did discuss potentially seeing cardiologist if she has ongoing lightheadedness that she may need to wear a Holter monitor but with the normal findings today on EKG she does not require admission. ? ?  ? ? ?FINAL CLINICAL IMPRESSION(S) / ED DIAGNOSES  ? ?Final diagnoses:  ?Near syncope  ?Epistaxis  ? ? ? ?Rx / DC Orders  ? ?ED Discharge Orders   ? ? None  ? ?  ? ? ? ?Note:  This document was prepared using Dragon voice recognition software and may include unintentional dictation errors. ?  ?Rada Hay, MD ?05/24/21 1531 ? ?

## 2021-05-24 NOTE — Discharge Instructions (Addendum)
Please stop using your nasal spray.  Please put Vaseline or Aquaphor into your nose which may help with the bleeding.  Please follow-up with your primary care provider if your symptoms are not improving.   ?

## 2021-05-24 NOTE — ED Triage Notes (Signed)
Pt with c/o lightheadedness x 3-4 days, and states she feels like it is getting worse. Pt states she has been having dried and scant amounts of fresh blood in her nose in the mornings. Pt with c/o nausea. Pt denies LOC but feels like she might have a syncopal episode at times.  ?

## 2021-06-22 ENCOUNTER — Other Ambulatory Visit: Payer: Self-pay | Admitting: Dermatology

## 2021-06-27 ENCOUNTER — Emergency Department: Payer: Medicaid Other

## 2021-06-27 ENCOUNTER — Emergency Department
Admission: EM | Admit: 2021-06-27 | Discharge: 2021-06-27 | Disposition: A | Discharge status: Home / Self Care | Payer: Medicaid Other | Attending: Emergency Medicine | Admitting: Emergency Medicine

## 2021-06-27 ENCOUNTER — Encounter: Payer: Self-pay | Admitting: Emergency Medicine

## 2021-06-27 ENCOUNTER — Other Ambulatory Visit: Payer: Self-pay

## 2021-06-27 DIAGNOSIS — R1030 Lower abdominal pain, unspecified: Secondary | ICD-10-CM | POA: Insufficient documentation

## 2021-06-27 DIAGNOSIS — M79631 Pain in right forearm: Secondary | ICD-10-CM | POA: Insufficient documentation

## 2021-06-27 DIAGNOSIS — Y92410 Unspecified street and highway as the place of occurrence of the external cause: Secondary | ICD-10-CM | POA: Insufficient documentation

## 2021-06-27 LAB — URINALYSIS, ROUTINE W REFLEX MICROSCOPIC
Bilirubin Urine: NEGATIVE
Glucose, UA: NEGATIVE mg/dL
Hgb urine dipstick: NEGATIVE
Ketones, ur: NEGATIVE mg/dL
Leukocytes,Ua: NEGATIVE
Nitrite: NEGATIVE
Protein, ur: NEGATIVE mg/dL
Specific Gravity, Urine: 1.02 (ref 1.005–1.030)
pH: 5 (ref 5.0–8.0)

## 2021-06-27 LAB — COMPREHENSIVE METABOLIC PANEL
ALT: 23 U/L (ref 0–44)
AST: 24 U/L (ref 15–41)
Albumin: 4.6 g/dL (ref 3.5–5.0)
Alkaline Phosphatase: 104 U/L (ref 38–126)
Anion gap: 11 (ref 5–15)
BUN: 21 mg/dL — ABNORMAL HIGH (ref 6–20)
CO2: 25 mmol/L (ref 22–32)
Calcium: 9.5 mg/dL (ref 8.9–10.3)
Chloride: 101 mmol/L (ref 98–111)
Creatinine, Ser: 0.73 mg/dL (ref 0.44–1.00)
GFR, Estimated: >60 mL/min (ref >60)
Glucose, Bld: 90 mg/dL (ref 70–99)
Potassium: 3.9 mmol/L (ref 3.5–5.1)
Sodium: 137 mmol/L (ref 135–145)
Total Bilirubin: 0.9 mg/dL (ref 0.3–1.2)
Total Protein: 8.1 g/dL (ref 6.5–8.1)

## 2021-06-27 LAB — CBC WITH DIFFERENTIAL/PLATELET
Abs Immature Granulocytes: 0.05 10*3/uL (ref 0.00–0.07)
Basophils Absolute: 0.1 10*3/uL (ref 0.0–0.1)
Basophils Relative: 1 %
Eosinophils Absolute: 0.2 10*3/uL (ref 0.0–0.5)
Eosinophils Relative: 1 %
HCT: 42 % (ref 36.0–46.0)
Hemoglobin: 14 g/dL (ref 12.0–15.0)
Immature Granulocytes: 0 %
Lymphocytes Relative: 22 %
Lymphs Abs: 3.2 10*3/uL (ref 0.7–4.0)
MCH: 29.9 pg (ref 26.0–34.0)
MCHC: 33.3 g/dL (ref 30.0–36.0)
MCV: 89.6 fL (ref 80.0–100.0)
Monocytes Absolute: 0.8 10*3/uL (ref 0.1–1.0)
Monocytes Relative: 6 %
Neutro Abs: 10.2 10*3/uL — ABNORMAL HIGH (ref 1.7–7.7)
Neutrophils Relative %: 70 %
Platelets: 282 10*3/uL (ref 150–400)
RBC: 4.69 MIL/uL (ref 3.87–5.11)
RDW: 11.8 % (ref 11.5–15.5)
WBC: 14.5 10*3/uL — ABNORMAL HIGH (ref 4.0–10.5)
nRBC: 0 % (ref 0.0–0.2)

## 2021-06-27 LAB — LIPASE, BLOOD: Lipase: 39 U/L (ref 11–51)

## 2021-06-27 LAB — POC URINE PREG, ED: Preg Test, Ur: NEGATIVE

## 2021-06-27 MED ORDER — METHOCARBAMOL 500 MG PO TABS
500.0000 mg | ORAL_TABLET | Freq: Three times a day (TID) | ORAL | 0 refills | Status: AC | PRN
Start: 2021-06-27 — End: 2021-07-02

## 2021-06-27 MED ORDER — IOHEXOL 300 MG/ML  SOLN
100.0000 mL | Freq: Once | INTRAMUSCULAR | Status: AC | PRN
  Administered 2021-06-27: 100 mL via INTRAVENOUS
  Filled 2021-06-27: qty 100

## 2021-06-27 MED ORDER — MELOXICAM 15 MG PO TABS: 15.0000 mg | ORAL_TABLET | Freq: Every day | ORAL | 2 refills | Status: DC

## 2021-06-27 NOTE — ED Notes (Signed)
Pt assessed at this time.  Pt noted to have pain and tenderness to lower abd at this time.  Pt also with pain to right amd and hand, with bruising noted.  Pt denies neck, back or chest pain on assessment.   ?
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
## 2021-06-27 NOTE — ED Notes (Signed)
Discharge instructions, script information and follow -up provided to patient. Patient verbalized understanding. Patient ambulated with a steady gait and without assistance out to the waiting room. ?
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
## 2021-06-27 NOTE — Discharge Instructions (Signed)
You can take meloxicam and Robaxin as directed.  ?

## 2021-06-29 ENCOUNTER — Other Ambulatory Visit: Payer: Self-pay | Admitting: Dermatology

## 2021-06-29 DIAGNOSIS — L700 Acne vulgaris: Secondary | ICD-10-CM

## 2021-07-01 ENCOUNTER — Ambulatory Visit (INDEPENDENT_AMBULATORY_CARE_PROVIDER_SITE_OTHER): Payer: Medicaid Other | Admitting: Dermatology

## 2021-07-01 DIAGNOSIS — L700 Acne vulgaris: Secondary | ICD-10-CM | POA: Diagnosis not present

## 2021-07-01 DIAGNOSIS — Z79899 Other long term (current) drug therapy: Secondary | ICD-10-CM

## 2021-07-01 DIAGNOSIS — L661 Lichen planopilaris: Secondary | ICD-10-CM

## 2021-07-01 MED ORDER — SPIRONOLACTONE 100 MG PO TABS: 100.0000 mg | ORAL_TABLET | Freq: Every day | ORAL | 4 refills | Status: DC

## 2021-07-01 NOTE — Patient Instructions (Addendum)
Cont Clobetasol solution decreasing to 3 days a week Monday, Wednesday, Fridays ? ? ?If You Need Anything After Your Visit ? ?If you have any questions or concerns for your doctor, please call our main line at [phone] and press option 4 to reach your doctor's medical assistant. If no one answers, please leave a voicemail as directed and we will return your call as soon as possible. Messages left after 4 pm will be answered the following business day.  ? ?You may also send [place] a message via MyChart. We typically respond to MyChart messages within 1-2 business days. ? ?For prescription refills, please ask your pharmacy to contact our office. Our fax number is [phone]. ? ?If you have an urgent issue when the clinic is closed that cannot wait until the next business day, you can page your doctor at the number below.   ? ?Please note that while we do our best to be available for urgent issues outside of office hours, we are not available 24/7.  ? ?If you have an urgent issue and are unable to reach [place], you may choose to seek medical care at your doctor's office, retail clinic, urgent care center, or emergency room. ? ?If you have a medical emergency, please immediately call 911 or go to the emergency department. ? ?Pager Numbers ? ?- Dr. Nehemiah Massed: [phone] ? ?- Dr. Laurence Ferrari: [phone] ? ?- Dr. Nicole Kindred: [phone] ? ?In the event of inclement weather, please call our main line at [phone] for an update on the status of any delays or closures. ? ?Dermatology Medication Tips: ?Please keep the boxes that topical medications come in in order to help keep track of the instructions about where and how to use these. Pharmacies typically print the medication instructions only on the boxes and not directly on the medication tubes.  ? ?If your medication is too expensive, please contact our office at [phone] option 4 or send [place] a message through [place].  ? ?We are unable to tell what your co-pay for  medications will be in advance as this is different depending on your insurance coverage. However, we may be able to find a substitute medication at lower cost or fill out paperwork to get insurance to cover a needed medication.  ? ?If a prior authorization is required to get your medication covered by your insurance company, please allow [place] 1-2 business days to complete this process. ? ?Drug prices often vary depending on where the prescription is filled and some pharmacies may offer cheaper prices. ? ?The website www.goodrx.com contains coupons for medications through different pharmacies. The prices here do not account for what the cost may be with help from insurance (it may be cheaper with your insurance), but the website can give you the price if you did not use any insurance.  ?- You can print the associated coupon and take it with your prescription to the pharmacy.  ?- You may also stop by our office during regular business hours and pick up a GoodRx coupon card.  ?- If you need your prescription sent electronically to a different pharmacy, notify our office through [facility] or by phone at [phone] option 4. ? ? ? ? ?Si Usted Necesita Algo Despu?s de Su Visita ? ?Tambi?n puede enviarnos un mensaje a trav?s de MyChart. Por lo general respondemos a los mensajes de MyChart en el transcurso de 1 a 2 d?as h?biles. ? ?Para renovar recetas, por favor pida a su farmacia que se ponga en  contacto con nuestra oficina. Nuestro n?mero de fax es el [phone]. ? ?Si tiene un asunto urgente cuando la cl?nica est? cerrada y que no puede esperar hasta el siguiente d?a h?bil, puede llamar/localizar a su doctor(a) al n?mero que aparece a continuaci?n.  ? ?Por favor, tenga en cuenta que aunque hacemos todo lo posible para estar disponibles para asuntos urgentes fuera del horario de oficina, no estamos disponibles las 24 horas del d?a, los 7 d?as de la semana.  ? ?Si tiene un problema urgente y no puede  comunicarse con nosotros, puede optar por buscar atenci?n m?dica  en el consultorio de su doctor(a), en una cl?nica privada, en un centro de atenci?n urgente o en una sala de emergencias. ? ?Si tiene Engineer, maintenance (IT) m?dica, por favor llame inmediatamente al 911 o vaya a la sala de emergencias. ? ?N?meros de b?per ? ?- Dr. Nehemiah Massed: [phone] ? ?- Dra. Moye: [phone] ? ?- Dra. Nicole Kindred: [phone] ? ?En caso de inclemencias del tiempo, por favor llame a nuestra l?nea principal al [phone] para una actualizaci?n sobre el estado de cualquier retraso o cierre. ? ?Consejos para la medicaci?n en dermatolog?a: ?Por favor, guarde las cajas en las que vienen los medicamentos de uso t?pico para ayudarle a seguir las instrucciones sobre d?nde y c?mo usarlos. Las farmacias generalmente imprimen las instrucciones del medicamento s?lo en las cajas y no directamente en los tubos del [place].  ? ?Si su medicamento es muy caro, por favor, p?ngase en contacto con Zigmund Daniel llamando al [phone] y presione la opci?n 4 o env?enos un mensaje a trav?s de MyChart.  ? ?No podemos decirle cu?l ser? su copago por los medicamentos por adelantado ya que esto es diferente dependiendo de la cobertura de su seguro. Sin embargo, es posible que podamos encontrar un medicamento sustituto a Electrical engineer un formulario para que el seguro cubra el medicamento que se considera necesario.  ? ?Si se requiere Ardelia Mems autorizaci?n previa para que su compa??a de seguros [place] su medicamento, por favor perm?tanos de 1 a 2 d?as h?biles para completar este proceso. ? ?Los precios de los medicamentos var?an con frecuencia dependiendo del Environmental consultant de d?nde se surte la receta y alguna farmacias pueden ofrecer precios m?s baratos. ? ?El sitio web www.goodrx.com tiene cupones para medicamentos de Airline pilot. Los precios aqu? no tienen en cuenta lo que podr?a costar con la ayuda del seguro (puede ser m?s barato con su seguro), pero  el sitio web puede darle el precio si no utiliz? ning?n seguro.  ?- Puede imprimir el cup?n correspondiente y llevarlo con su receta a la farmacia.  ?- Tambi?n puede pasar por nuestra oficina durante el horario de atenci?n regular y recoger una tarjeta de cupones de GoodRx.  ?- Si necesita que su receta se env?e electr?nicamente a Chiropodist, informe a nuestra oficina a trav?s de MyChart de [place] o por tel?fono llamando al [phone] y presione la opci?n 4.  ?

## 2021-07-01 NOTE — Progress Notes (Signed)
? ?Follow-Up Visit ?  ?Subjective  ?Tamara Flynn is a 26 y.o. female who presents for the following: Lichen planopilaris (Scalp, clobetasol sol qd, [place] kenalog injections in past) and Acne (Face, Spironolactone '100mg'$  1 po qd, Tretinoin cream 0.025% cr qhs followed by Amzeeq, Clindamycin/BPO qam). ? ?The following portions of the chart were reviewed this encounter and updated as appropriate:  ? Tobacco  Allergies  Meds  Problems  Med Hx  Surg Hx  Fam Hx   ?  ?Review of Systems:  No other skin or systemic complaints except as noted in HPI or Assessment and Plan. ? ?Objective  ?Well appearing patient in no apparent distress; mood and affect are within normal limits. ? ?A focused examination was performed including face, scalp. Relevant physical exam findings are noted in the Assessment and Plan. ? ?Scalp ?No patchy alopecia noted today, normal appearing scalp ? ?Head - Anterior (Face) ?Spotty hyperpigmentation ? ? 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 Currently well-controlled, but not to goal. ?Cont Clobetasol sol decreasing to 3 days a week Monday, Wednesday, Fridays to aa scalp ? ?Topical steroids (such as triamcinolone, fluocinolone, fluocinonide, mometasone, clobetasol, halobetasol, betamethasone, hydrocortisone) can cause thinning and lightening of the skin if they are used for too long in the same area. Your physician has selected the right strength medicine for your problem and area affected on the body. Please use your medication only as directed by your physician to prevent side effects.   ? ?Acne vulgaris ?Head - Anterior (Face) ?BP today 96/63 ? ?Cont Tretinoin 0.025% cream qhs ?Cont Amzeeq foam qhs after Tretinoin ?Cont Clindamycin BPO gel qam ?  ?Cont Spironolactone 100 mg by mouth daily ? ?Topical retinoid medications like  tretinoin/Retin-A, adapalene/Differin, tazarotene/Fabior, and Epiduo/Epiduo Forte can cause dryness and irritation when first started. Only apply a pea-sized amount to the entire affected area. Avoid applying it around the eyes, edges of mouth and creases at the nose. If you experience irritation, use a good moisturizer first and/or apply the medicine less often. If you are doing well with the medicine, you can increase how often you use it until you are applying every night. Be careful with sun protection while using this medication as it can make you sensitive to the sun. This medicine should not be used by pregnant women.   ? ?Spironolactone can cause increased urination and cause blood pressure to decrease. Please watch for signs of lightheadedness and be cautious when changing position. It can sometimes cause breast tenderness or an irregular period in premenopausal women. It can also increase potassium. The increase in potassium usually is not a concern unless you are taking other medicines that also increase potassium, so please be sure your doctor knows all of the other medications you are taking. This medication should not be taken by pregnant women.  This medicine should also not be taken together with sulfa drugs like Bactrim (trimethoprim/sulfamethexazole).   ? Long term medication management.  Patient is using long term (months to years) prescription medication  to control their dermatologic condition.  These medications require periodic monitoring to evaluate for efficacy and side effects and may require periodic laboratory monitoring. ? ?  ?Medication Sig Dispense Refill  ? clindamycin (CLEOCIN T) 1 % external solution APPLY TO AFFECTED AREAS AT SCALP ONE - TWO TIMES DAILY 60 mL 0  ?  Clindamycin-Benzoyl Per, Refr, gel FOR ACNE - APPLY A THIN COAT TO THE FACE EVERY AM, BENZOYL PEROXIDE CAN CAUSE DRYNESS AND IRRITATION OF THE SKIN. IT CAN ALSO BLEACH FABRIC. 45 g 3  ? clobetasol (TEMOVATE) 0.05 %  external solution APPLY ONCE DAILY. TO AFFECTED AREAS OF SCALP. AVOID APPLYING TO FACE, GROIN,& AXILLA, AS DIRECTED. 50 mL 0  ? escitalopram (LEXAPRO) 10 MG tablet Take 10 mg by mouth daily.    ? FLOVENT HFA 220 MCG/ACT inhaler Inhale 1 puff into the lungs 2 (two) times daily. (Patient taking differently: Inhale 1 puff into the lungs 2 (two) times daily as needed (respiratory issues.).) 1 Inhaler 0  ? meloxicam (MOBIC) 15 MG tablet Take 1 tablet (15 mg total) by mouth daily. 30 tablet 2  ? Minocycline HCl Micronized (AMZEEQ) 4 % FOAM Use a thin layer topically at night to spot treat affected areas of face for acne. 30 g 2  ? PROAIR HFA 108 (90 Base) MCG/ACT inhaler Inhale 2 puffs into the lungs every 4 (four) hours as needed. (Patient taking differently: Inhale 2 puffs into the lungs every 4 (four) hours as needed for wheezing or shortness of breath.) 1 Inhaler 0  ? tretinoin (RETIN-A) 0.025 % cream Apply topically at bedtime. Use as directed 45 g 6  ? ?  ? ?Related Medications ?tretinoin (RETIN-A) 0.025 % cream ?Apply topically at bedtime. Use as directed ? ?Clindamycin-Benzoyl Per, Refr, gel ?FOR ACNE - APPLY A THIN COAT TO THE FACE EVERY AM, BENZOYL PEROXIDE CAN CAUSE DRYNESS AND IRRITATION OF THE SKIN. IT CAN ALSO BLEACH FABRIC. ? ?Minocycline HCl Micronized (AMZEEQ) 4 % FOAM ?Use a thin layer topically at night to spot treat affected areas of face for acne. ? ?spironolactone (ALDACTONE) 100 MG tablet ?Take 1 tablet (100 mg total) by mouth daily. ? ?Return in about 4 months (around 3/97/6734) for Lichen planopilaris, Acne w/ Dr. Laurence Ferrari. ? ?I, Othelia Pulling, RMA, am acting as scribe for Sarina Ser, MD .  ?Documentation: I have reviewed the above documentation for accuracy and completeness, and I agree with the above. ? ?Sarina Ser, MD ? ?

## 2021-07-12 ENCOUNTER — Encounter: Payer: Self-pay | Admitting: Dermatology

## 2021-08-16 ENCOUNTER — Other Ambulatory Visit: Payer: Self-pay | Admitting: Dermatology

## 2021-08-16 DIAGNOSIS — L659 Nonscarring hair loss, unspecified: Secondary | ICD-10-CM

## 2021-08-19 ENCOUNTER — Ambulatory Visit: Payer: Medicaid Other | Admitting: Obstetrics

## 2021-09-17 ENCOUNTER — Other Ambulatory Visit: Payer: Self-pay | Admitting: Dermatology

## 2021-09-17 ENCOUNTER — Encounter: Payer: Self-pay | Admitting: Dermatology

## 2021-09-17 DIAGNOSIS — L700 Acne vulgaris: Secondary | ICD-10-CM

## 2021-09-17 DIAGNOSIS — L659 Nonscarring hair loss, unspecified: Secondary | ICD-10-CM

## 2021-09-21 MED ORDER — SPIRONOLACTONE 100 MG PO TABS: 100.0000 mg | ORAL_TABLET | Freq: Every day | ORAL | 1 refills | Status: DC

## 2021-11-04 ENCOUNTER — Ambulatory Visit (INDEPENDENT_AMBULATORY_CARE_PROVIDER_SITE_OTHER): Payer: Medicaid Other | Admitting: Dermatology

## 2021-11-04 ENCOUNTER — Encounter: Payer: Self-pay | Admitting: Dermatology

## 2021-11-04 DIAGNOSIS — L661 Lichen planopilaris, unspecified: Secondary | ICD-10-CM

## 2021-11-04 DIAGNOSIS — L219 Seborrheic dermatitis, unspecified: Secondary | ICD-10-CM

## 2021-11-04 DIAGNOSIS — L700 Acne vulgaris: Secondary | ICD-10-CM | POA: Diagnosis not present

## 2021-11-04 DIAGNOSIS — Z79899 Other long term (current) drug therapy: Secondary | ICD-10-CM | POA: Diagnosis not present

## 2021-11-04 MED ORDER — CLINDAMYCIN PHOS-BENZOYL PEROX 1.2-5 % EX GEL: CUTANEOUS | 1 refills | Status: DC

## 2021-11-04 MED ORDER — SPIRONOLACTONE 100 MG PO TABS: 100.0000 mg | ORAL_TABLET | Freq: Every day | ORAL | 1 refills | Status: DC

## 2021-11-04 MED ORDER — AMZEEQ 4 % EX FOAM: CUTANEOUS | 1 refills | Status: DC

## 2021-11-04 MED ORDER — FLUOCINOLONE ACETONIDE BODY 0.01 % EX OIL: TOPICAL_OIL | CUTANEOUS | 5 refills | Status: DC

## 2021-11-04 MED ORDER — TRETINOIN 0.05 % EX CREA: TOPICAL_CREAM | Freq: Every day | CUTANEOUS | 1 refills | Status: DC

## 2021-11-04 NOTE — Patient Instructions (Addendum)
For itch at scalp Use 2x weekly  Recommend Avlon Keracare anti-dandruff shampoo and conditioner at least once weekly, leaving shampoo on for about 10 minutes before rinsing and conditioning.        Topical retinoid medications like tretinoin/Retin-A, adapalene/Differin, tazarotene/Fabior, and Epiduo/Epiduo Forte can cause dryness and irritation when first started. Only apply a pea-sized amount to the entire affected area. Avoid applying it around the eyes, edges of mouth and creases at the nose. If you experience irritation, use a good moisturizer first and/or apply the medicine less often. If you are doing well with the medicine, you can increase how often you use it until you are applying every night. Be careful with sun protection while using this medication as it can make you sensitive to the sun. This medicine should not be used by pregnant women.    Spironolactone can cause increased urination and cause blood pressure to decrease. Please watch for signs of lightheadedness and be cautious when changing position. It can sometimes cause breast tenderness or an irregular period in premenopausal women. It can also increase potassium. The increase in potassium usually is not a concern unless you are taking other medicines that also increase potassium, so please be sure your doctor knows all of the other medications you are taking. This medication should not be taken by pregnant women.  This medicine should also not be taken together with sulfa drugs like Bactrim (trimethoprim/sulfamethexazole).     Due to recent changes in healthcare laws, you may see results of your pathology and/or laboratory studies on MyChart before the doctors have had a chance to review them. We understand that in some cases there may be results that are confusing or concerning to you. Please understand that not all results are received at the same time and often the doctors may need to interpret multiple results in order to  provide you with the best plan of care or course of treatment. Therefore, we ask that you please give [place] 2 business days to thoroughly review all your results before contacting the office for clarification. Should we see a critical lab result, you will be contacted sooner.   If You Need Anything After Your Visit  If you have any questions or concerns for your doctor, please call our main line at [phone] and press option 4 to reach your doctor's medical assistant. If no one answers, please leave a voicemail as directed and we will return your call as soon as possible. Messages left after 4 pm will be answered the following business day.   You may also send [place] a message via [place]. We typically respond to MyChart messages within 1-2 business days.  For prescription refills, please ask your pharmacy to contact our office. Our fax number is [phone].  If you have an urgent issue when the clinic is closed that cannot wait until the next business day, you can page your doctor at the number below.    Please note that while we do our best to be available for urgent issues outside of office hours, we are not available 24/7.   If you have an urgent issue and are unable to reach [place], you may choose to seek medical care at your doctor's office, retail clinic, urgent care center, or emergency room.  If you have a medical emergency, please immediately call 911 or go to the emergency department.  Pager Numbers  - Dr. Nehemiah Massed: [phone]  - Dr. Laurence Ferrari: [phone]  - Dr. Nicole Kindred: [phone]  In  the event of inclement weather, please call our main line at [phone] for an update on the status of any delays or closures.  Dermatology Medication Tips: Please keep the boxes that topical medications come in in order to help keep track of the instructions about where and how to use these. Pharmacies typically print the medication instructions only on the boxes and not directly on the  medication tubes.   If your medication is too expensive, please contact our office at [phone] option 4 or send [place] a message through [place].   We are unable to tell what your co-pay for medications will be in advance as this is different depending on your insurance coverage. However, we may be able to find a substitute medication at lower cost or fill out paperwork to get insurance to cover a needed medication.   If a prior authorization is required to get your medication covered by your insurance company, please allow [place] 1-2 business days to complete this process.  Drug prices often vary depending on where the prescription is filled and some pharmacies may offer cheaper prices.  The website www.goodrx.com contains coupons for medications through different pharmacies. The prices here do not account for what the cost may be with help from insurance (it may be cheaper with your insurance), but the website can give you the price if you did not use any insurance.  - You can print the associated coupon and take it with your prescription to the pharmacy.  - You may also stop by our office during regular business hours and pick up a GoodRx coupon card.  - If you need your prescription sent electronically to a different pharmacy, notify our office through [facility] or by phone at [phone] option 4.     Si Usted Necesita Algo Despus de Su Visita  Tambin puede enviarnos un mensaje a travs de Pharmacist, community. Por lo general respondemos a los mensajes de MyChart en el transcurso de 1 a 2 das hbiles.  Para renovar recetas, por favor pida a su farmacia que se ponga en contacto con nuestra oficina. Harland Dingwall de fax es [place] [phone].  Si tiene un asunto urgente cuando la clnica est cerrada y que no puede esperar hasta el siguiente da hbil, puede llamar/localizar a su doctor(a) al nmero que aparece a continuacin.   Por favor, tenga en cuenta que aunque hacemos todo lo posible  para estar disponibles para asuntos urgentes fuera del horario de [place], no estamos disponibles las 24 horas del da, los 7 das de la [place].   Si tiene un problema urgente y no puede comunicarse con nosotros, puede optar por buscar atencin mdica  en el consultorio de su doctor(a), en una clnica privada, en un centro de atencin urgente o en una sala de emergencias.  Si tiene Engineering geologist, por favor llame inmediatamente al 911 o vaya a la sala de emergencias.  Nmeros de bper  - Dr. Nehemiah Massed: [phone]  - Dra. Moye: [phone]  - Dra. Nicole Kindred: [phone]  En caso de inclemencias del [place], por favor llame a Johnsie Kindred principal al [phone] para una actualizacin sobre el [place] de cualquier retraso o cierre.  Consejos para la medicacin en dermatologa: Por favor, guarde las cajas en las que vienen los medicamentos de uso tpico para ayudarle a seguir las instrucciones sobre dnde y cmo usarlos. Las farmacias generalmente imprimen las instrucciones del medicamento slo en las cajas y no directamente en los tubos del [place].   Si  su medicamento es muy caro, por favor, pngase en contacto con Zigmund Daniel llamando al [phone] y presione la opcin 4 o envenos un mensaje a travs de Pharmacist, community.   No podemos decirle cul ser su copago por los medicamentos por adelantado ya que esto es diferente dependiendo de la cobertura de su seguro. Sin embargo, es posible que podamos encontrar un medicamento sustituto a Electrical engineer un formulario para que el seguro cubra el medicamento que se considera necesario.   Si se requiere una autorizacin previa para que su compaa de seguros [place] su medicamento, por favor permtanos de 1 a 2 das hbiles para completar este proceso.  Los precios de los medicamentos varan con frecuencia dependiendo del Environmental consultant de dnde se surte la receta y alguna farmacias pueden ofrecer precios ms baratos.  El sitio web  www.goodrx.com tiene cupones para medicamentos de Airline pilot. Los precios aqu no tienen en cuenta lo que podra costar con la ayuda del seguro (puede ser ms barato con su seguro), pero el sitio web puede darle el precio si no utiliz Research scientist (physical sciences).  - Puede imprimir el cupn correspondiente y llevarlo con su receta a la farmacia.  - Tambin puede pasar por nuestra oficina durante el horario de atencin regular y Charity fundraiser una tarjeta de cupones de GoodRx.  - Si necesita que su receta se enve electrnicamente a una farmacia diferente, informe a nuestra oficina a travs de MyChart de [place] o por telfono llamando al [phone] y presione la opcin 4.

## 2021-11-04 NOTE — Progress Notes (Signed)
Follow-Up Visit   Subjective  Tamara Flynn is a 26 y.o. female who presents for the following: lichen planopilaris (4 month follow up on lichen planopilaris. Has improved continueing the clobetasol solution at scalp 3 times weekly. Does reports some itchiness at scalp. ) and Acne (Patient reports acne has improved while on spironolactone, tretinion, amzeeq, and clindamycin. Reports some dizziness after taking spironolactone and zoloft in the am. States acne has improved at face. ).   The following portions of the chart were reviewed this encounter and updated as appropriate:  Tobacco  Allergies  Meds  Problems  Med Hx  Surg Hx  Fam Hx      Review of Systems: No other skin or systemic complaints except as noted in HPI or Assessment and Plan.   Objective  Well appearing patient in no apparent distress; mood and affect are within normal limits.  A focused examination was performed including scalp, face, . Relevant physical exam findings are noted in the Assessment and Plan.  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 Currently well-controlled.  D/c  Clobetasol  Start Fluocinolone Acetonide Body 0.01% oil - apply as a spot treatment to aa at scalp qd/bid qd prn   Fluocinolone Acetonide Body 0.01 % OIL - Scalp Spot treatment aa at scalp qd/bid qd prn  Acne vulgaris Head - Anterior (Face)  Chronic and persistent condition with duration or expected duration over one year. Condition is symptomatic/ bothersome to patient. Not currently at goal.  BP today 114/74   Increase Tretinoin 0.025% cream qhs increase  0.05 % qhs  followed with moisturizer  Cont Amzeeq foam qhs after Tretinoin Cont Clindamycin BPO gel qam Take Spironolactone 100 mg by mouth qhs   Patient reports she will be trying to get pregnant next year Discussed pregnacy safe treatments    Topical retinoid medications like tretinoin/Retin-A, adapalene/Differin, tazarotene/Fabior, and Epiduo/Epiduo Forte can cause dryness and irritation when first started. Only apply a pea-sized amount to the entire affected area. Avoid applying it around the eyes, edges of mouth and creases at the nose. If you experience irritation, use a good moisturizer first and/or apply the medicine less often. If you are doing well with the medicine, you can increase how often you use it until you are applying every night. Be careful with sun protection while using this medication as it can make you sensitive to the sun. This medicine should not be used by pregnant women.     Spironolactone can cause increased urination and cause blood pressure to decrease. Please watch for signs of lightheadedness and be cautious when changing position. It can sometimes cause breast tenderness or an irregular period in premenopausal women. It can also increase potassium. The increase in potassium usually is not a concern unless you are taking other medicines that also increase potassium, so please be sure your doctor knows all of the other medications you are taking. This medication should not be taken by pregnant women.  This medicine should also not be taken together with sulfa drugs like Bactrim (trimethoprim/sulfamethexazole).    Long term medication management.  Patient is using long term (  months to years) prescription medication  to control their dermatologic condition.  These medications require periodic monitoring to evaluate for efficacy and side effects and may require periodic laboratory monitoring.  tretinoin (RETIN-A) 0.05 % cream - Head - Anterior (Face) Apply topically at  bedtime.  Clindamycin-Benzoyl Per, Refr, gel - Head - Anterior (Face) FOR ACNE - APPLY A THIN COAT TO THE FACE EVERY AM, BENZOYL PEROXIDE CAN CAUSE DRYNESS AND IRRITATION OF THE SKIN. IT CAN ALSO BLEACH FABRIC.  spironolactone (ALDACTONE) 100 MG tablet - Head - Anterior (Face) Take 1 tablet (100 mg total) by mouth at bedtime.  Minocycline HCl Micronized (AMZEEQ) 4 % FOAM - Head - Anterior (Face) Use a thin layer topically at night to spot treat affected areas of face for acne.  Seborrheic dermatitis Scalp  Seborrheic Dermatitis  -  is a chronic persistent rash characterized by pinkness and scaling most commonly of the mid face but also can occur on the scalp (dandruff), ears; mid chest, mid back and groin.  It tends to be exacerbated by stress and cooler weather.  People who have neurologic disease may experience new onset or exacerbation of existing seborrheic dermatitis.  The condition is not curable but treatable and can be controlled.  Recommend Avlon Keracare anti-dandruff shampoo and conditioner at least once weekly, leaving shampoo on for about 10 minutes before rinsing and conditioning.   Can use fluocinolone oil daily as needed for itch  Chronic and persistent condition with duration or expected duration over one year. Condition is symptomatic/ bothersome to patient. Not currently at goal.    Return for 3 - 4 month follow up on acne and lichen planopilaris .  I, Ruthell Rummage, CMA, am acting as scribe for Forest Gleason, MD.  Documentation: I have reviewed the above documentation for accuracy and completeness, and I agree with the above.  Forest Gleason, MD

## 2022-02-10 ENCOUNTER — Ambulatory Visit (INDEPENDENT_AMBULATORY_CARE_PROVIDER_SITE_OTHER): Payer: Medicaid Other | Admitting: Dermatology

## 2022-02-10 ENCOUNTER — Encounter: Payer: Self-pay | Admitting: Dermatology

## 2022-02-10 VITALS — BP 116/77 | HR 85

## 2022-02-10 DIAGNOSIS — L219 Seborrheic dermatitis, unspecified: Secondary | ICD-10-CM

## 2022-02-10 DIAGNOSIS — L661 Lichen planopilaris: Secondary | ICD-10-CM

## 2022-02-10 DIAGNOSIS — L700 Acne vulgaris: Secondary | ICD-10-CM

## 2022-02-10 DIAGNOSIS — L659 Nonscarring hair loss, unspecified: Secondary | ICD-10-CM

## 2022-02-10 MED ORDER — WINLEVI 1 % EX CREA: TOPICAL_CREAM | CUTANEOUS | 3 refills | Status: DC

## 2022-02-10 MED ORDER — RETIN-A MICRO PUMP 0.06 % EX GEL: CUTANEOUS | 3 refills | Status: DC

## 2022-02-10 MED ORDER — CLOBETASOL PROPIONATE 0.05 % EX SOLN: CUTANEOUS | 3 refills | Status: DC

## 2022-02-10 MED ORDER — SPIRONOLACTONE 100 MG PO TABS: 100.0000 mg | ORAL_TABLET | Freq: Every day | ORAL | 1 refills | Status: DC

## 2022-02-10 NOTE — Telephone Encounter (Signed)
Continue tretinoin then instead of Retin-A Micro  Instead of Winlevi, we can add Aczone 7.5% to use thin layer daily. D/c clindamycin/BP and send in clindamycin solution for spot treating instead. Thank you!

## 2022-02-10 NOTE — Progress Notes (Signed)
Follow-Up Visit   Subjective  Tamara Flynn is a 26 y.o. 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 Patient taking spironolactone '100mg'$  daily and using tretinoin 0.05%, Amzeeq daily and clindamycin as needed for acne, improved. /Patient advises she occasionally uses clobetasol at scalp but is not having much shedding or itch. ).  The following portions of the chart were reviewed this encounter and updated as appropriate:   Tobacco  Allergies  Meds  Problems  Med Hx  Surg Hx  Fam Hx      Review of Systems:  No other skin or systemic complaints except as noted in HPI or Assessment and Plan.  Objective  Well appearing patient in no apparent distress; mood and affect are within normal limits.  A focused examination was performed including face, neck, chest and back and scalp. Relevant physical exam findings are noted in the Assessment and Plan.  face Trace to 1+ open comedones at forehead and lower face  Scalp Very slight thinning at vertex without erythema and without recent signs of breakage  Scalp Mild scale    Assessment & Plan  Acne vulgaris face  Chronic and persistent condition with duration or expected duration over one year. Condition is symptomatic/ bothersome to patient. Not currently at goal. Still flaring with menses and getting irritated sometimes.  BP 116/77  D/c tretinoin. Start Retin-A Micro 0.06% nightly as tolerated.  Continue Amzeeq if needed.   Start Winlevi twice daily. If not covered, can send in Aczone as long as patient is not using a benzoyl peroxide. Would need to send in plain clindamycin.   Continue spironolactone '100mg'$  once daily.   Continue clindamycin/benzoyl peroxide for spot treatment  Recommended non-comedogenic (non-acne causing) facial oils include 100% argan oil or squalane. The can be used after applying any recommended creams or ointments to the  skin in the evening. The Ordinary Brand has a high-quality and affordable version of both of these and can be found at [place].  Spironolactone can cause increased urination and cause blood pressure to decrease. Please watch for signs of lightheadedness and be cautious when changing position. It can sometimes cause breast tenderness or an irregular period in premenopausal women. It can also increase potassium. The increase in potassium usually is not a concern unless you are taking other medicines that also increase potassium, so please be sure your doctor knows all of the other medications you are taking. This medication should not be taken by pregnant women.  This medicine should also not be taken together with sulfa drugs like Bactrim (trimethoprim/sulfamethexazole).   Topical retinoid medications like tretinoin/Retin-A, adapalene/Differin, tazarotene/Fabior, and Epiduo/Epiduo Forte can cause dryness and irritation when first started. Only apply a pea-sized amount to the entire affected area. Avoid applying it around the eyes, edges of mouth and creases at the nose. If you experience irritation, use a good moisturizer first and/or apply the medicine less often. If you are doing well with the medicine, you can increase how often you use it until you are applying every night. Be careful with sun protection while using this medication as it can make you sensitive to the sun. This medicine should not be used by pregnant women.   Benzoyl peroxide can cause dryness and irritation of the skin. It can also bleach fabric. When used together with Aczone (dapsone) cream, it can stain the skin orange.     Clascoterone (WINLEVI) 1 %  CREA - face Apply to face twice daily  Tretinoin Microsphere (RETIN-A MICRO PUMP) 0.06 % GEL - face Apply to face nightly as tolerated  Related Medications Clindamycin-Benzoyl Per, Refr, gel FOR ACNE - APPLY A THIN COAT TO THE FACE EVERY AM, BENZOYL PEROXIDE CAN CAUSE  DRYNESS AND IRRITATION OF THE SKIN. IT CAN ALSO BLEACH FABRIC.  Minocycline HCl Micronized (AMZEEQ) 4 % FOAM Use a thin layer topically at night to spot treat affected areas of face for acne.  spironolactone (ALDACTONE) 100 MG tablet Take 1 tablet (100 mg total) by mouth at bedtime.  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 Currently well-controlled.  Biopsy proven, though clinically more c/w CCCA  May restart clobetasol 0.05% solution to affected areas at scalp as needed for itch/breakage.   Patient prefers clobetasol > fluocinolone oil.     Related Medications Fluocinolone Acetonide Body 0.01 % OIL Spot treatment aa at scalp qd/bid qd prn  Seborrheic dermatitis Scalp  Chronic condition with duration or expected duration over one year. Currently well-controlled.  May restart clobetasol 0.05% solution to affected areas at scalp as needed for itch/breakage.   Patient prefers clobetasol > fluocinolone oil.   Alopecia  Related Medications clobetasol (TEMOVATE) 0.05 % external solution APPLY ONCE DAILY. TO AFFECTED AREAS OF SCALP. AVOID APPLYING TO FACE, GROIN,& AXILLA, AS DIRECTED.   Return in about 4 months (around 06/12/2022) for acne, LPP.  Graciella Belton, RMA, am acting as scribe for Forest Gleason, MD .  Documentation: I have reviewed the above documentation for accuracy and completeness, and I agree with the above.  Forest Gleason, MD

## 2022-02-10 NOTE — Patient Instructions (Addendum)
BP 116/77  Discontinue tretinoin Continue Amzeeq if needed.  Start Winlevi twice daily. If not covered, can send in Aczone as long as patient is not using a benzoyl peroxide. Would need to send in plain clindamycin.  Start Retin-A Micro 0.06% nightly as tolerated. Continue spironolactone '100mg'$  once daily.   Recommended non-comedogenic (non-acne causing) facial oils include 100% argan oil or squalane. The can be used after applying any recommended creams or ointments to the skin in the evening. The Ordinary Brand has a high-quality and affordable version of both of these and can be found at [place].  Spironolactone can cause increased urination and cause blood pressure to decrease. Please watch for signs of lightheadedness and be cautious when changing position. It can sometimes cause breast tenderness or an irregular period in premenopausal women. It can also increase potassium. The increase in potassium usually is not a concern unless you are taking other medicines that also increase potassium, so please be sure your doctor knows all of the other medications you are taking. This medication should not be taken by pregnant women.  This medicine should also not be taken together with sulfa drugs like Bactrim (trimethoprim/sulfamethexazole).   Due to recent changes in healthcare laws, you may see results of your pathology and/or laboratory studies on MyChart before the doctors have had a chance to review them. We understand that in some cases there may be results that are confusing or concerning to you. Please understand that not all results are received at the same time and often the doctors may need to interpret multiple results in order to provide you with the best plan of care or course of treatment. Therefore, we ask that you please give [place] 2 business days to thoroughly review all your results before contacting the office for clarification. Should we see a critical lab result, you will be  contacted sooner.   If You Need Anything After Your Visit  If you have any questions or concerns for your doctor, please call our main line at [phone] and press option 4 to reach your doctor's medical assistant. If no one answers, please leave a voicemail as directed and we will return your call as soon as possible. Messages left after 4 pm will be answered the following business day.   You may also send [place] a message via [place]. We typically respond to MyChart messages within 1-2 business days.  For prescription refills, please ask your pharmacy to contact our office. Our fax number is [phone].  If you have an urgent issue when the clinic is closed that cannot wait until the next business day, you can page your doctor at the number below.    Please note that while we do our best to be available for urgent issues outside of office hours, we are not available 24/7.   If you have an urgent issue and are unable to reach [place], you may choose to seek medical care at your doctor's office, retail clinic, urgent care center, or emergency room.  If you have a medical emergency, please immediately call 911 or go to the emergency department.  Pager Numbers  - Dr. Nehemiah Massed: [phone]  - Dr. Laurence Ferrari: [phone]  - Dr. Nicole Kindred: [phone]  In the event of inclement weather, please call our main line at [phone] for an update on the status of any delays or closures.  Dermatology Medication Tips: Please keep the boxes that topical medications come in in order to help keep track of the  instructions about where and how to use these. Pharmacies typically print the medication instructions only on the boxes and not directly on the medication tubes.   If your medication is too expensive, please contact our office at [phone] option 4 or send [place] a message through [place].   We are unable to tell what your co-pay for medications will be in advance as this is different depending on your  insurance coverage. However, we may be able to find a substitute medication at lower cost or fill out paperwork to get insurance to cover a needed medication.   If a prior authorization is required to get your medication covered by your insurance company, please allow [place] 1-2 business days to complete this process.  Drug prices often vary depending on where the prescription is filled and some pharmacies may offer cheaper prices.  The website www.goodrx.com contains coupons for medications through different pharmacies. The prices here do not account for what the cost may be with help from insurance (it may be cheaper with your insurance), but the website can give you the price if you did not use any insurance.  - You can print the associated coupon and take it with your prescription to the pharmacy.  - You may also stop by our office during regular business hours and pick up a GoodRx coupon card.  - If you need your prescription sent electronically to a different pharmacy, notify our office through [facility] or by phone at [phone] option 4.     Si Usted Necesita Algo Despus de Su Visita  Tambin puede enviarnos un mensaje a travs de Pharmacist, community. Por lo general respondemos a los mensajes de MyChart en el transcurso de 1 a 2 das hbiles.  Para renovar recetas, por favor pida a su farmacia que se ponga en contacto con nuestra oficina. Harland Dingwall de fax es [place] [phone].  Si tiene un asunto urgente cuando la clnica est cerrada y que no puede esperar hasta el siguiente da hbil, puede llamar/localizar a su doctor(a) al nmero que aparece a continuacin.   Por favor, tenga en cuenta que aunque hacemos todo lo posible para estar disponibles para asuntos urgentes fuera del horario de [place], no estamos disponibles las 24 horas del da, los 7 das de la [place].   Si tiene un problema urgente y no puede comunicarse con nosotros, puede optar por buscar atencin mdica  en el  consultorio de su doctor(a), en una clnica privada, en un centro de atencin urgente o en una sala de emergencias.  Si tiene Engineering geologist, por favor llame inmediatamente al 911 o vaya a la sala de emergencias.  Nmeros de bper  - Dr. Nehemiah Massed: [phone]  - Dra. Moye: [phone]  - Dra. Nicole Kindred: [phone]  En caso de inclemencias del [place], por favor llame a Johnsie Kindred principal al [phone] para una actualizacin sobre el [place] de cualquier retraso o cierre.  Consejos para la medicacin en dermatologa: Por favor, guarde las cajas en las que vienen los medicamentos de uso tpico para ayudarle a seguir las instrucciones sobre dnde y cmo usarlos. Las farmacias generalmente imprimen las instrucciones del medicamento slo en las cajas y no directamente en los tubos del [place].   Si su medicamento es muy caro, por favor, pngase en contacto con Zigmund Daniel llamando al [phone] y presione la opcin 4 o envenos un mensaje a travs de Pharmacist, community.   No podemos decirle cul ser su copago por los medicamentos por adelantado ya  que esto es diferente dependiendo de la cobertura de su seguro. Sin embargo, es posible que podamos encontrar un medicamento sustituto a Electrical engineer un formulario para que el seguro cubra el medicamento que se considera necesario.   Si se requiere una autorizacin previa para que su compaa de seguros [place] su medicamento, por favor permtanos de 1 a 2 das hbiles para completar este proceso.  Los precios de los medicamentos varan con frecuencia dependiendo del Environmental consultant de dnde se surte la receta y alguna farmacias pueden ofrecer precios ms baratos.  El sitio web www.goodrx.com tiene cupones para medicamentos de Airline pilot. Los precios aqu no tienen en cuenta lo que podra costar con la ayuda del seguro (puede ser ms barato con su seguro), pero el sitio web puede darle el precio si no utiliz Research scientist (physical sciences).  - Puede  imprimir el cupn correspondiente y llevarlo con su receta a la farmacia.  - Tambin puede pasar por nuestra oficina durante el horario de atencin regular y Charity fundraiser una tarjeta de cupones de GoodRx.  - Si necesita que su receta se enve electrnicamente a una farmacia diferente, informe a nuestra oficina a travs de MyChart de [place] o por telfono llamando al [phone] y presione la opcin 4.

## 2022-02-11 ENCOUNTER — Other Ambulatory Visit: Payer: Self-pay

## 2022-02-11 DIAGNOSIS — L700 Acne vulgaris: Secondary | ICD-10-CM

## 2022-02-11 MED ORDER — TRETINOIN MICROSPHERE 0.04 % EX GEL: Freq: Every day | CUTANEOUS | 2 refills | Status: DC

## 2022-03-17 IMAGING — US US ABDOMEN LIMITED
1 series · 14 of 25 positions shown · non-contrast
Comparison: None.

CLINICAL DATA: Right upper quadrant pain

EXAM:
ULTRASOUND ABDOMEN LIMITED RIGHT UPPER QUADRANT

[Series 1: us abdomen limited ruq · 14 of 82 slices shown]
[im 1/82]
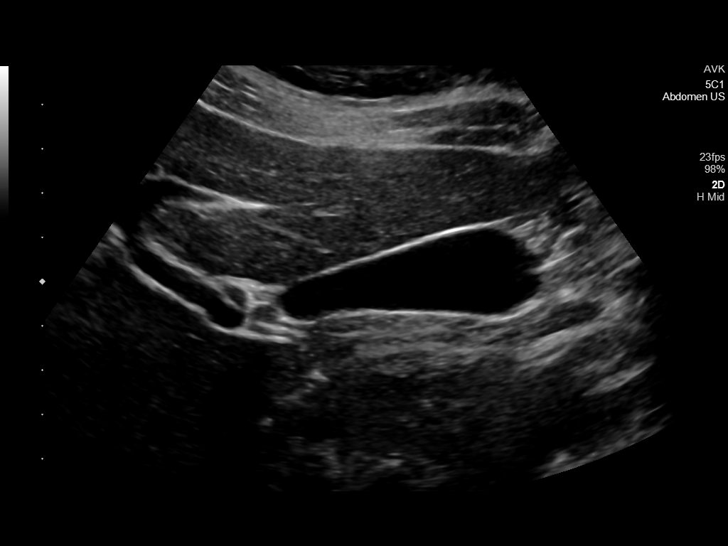
[im 7/82]
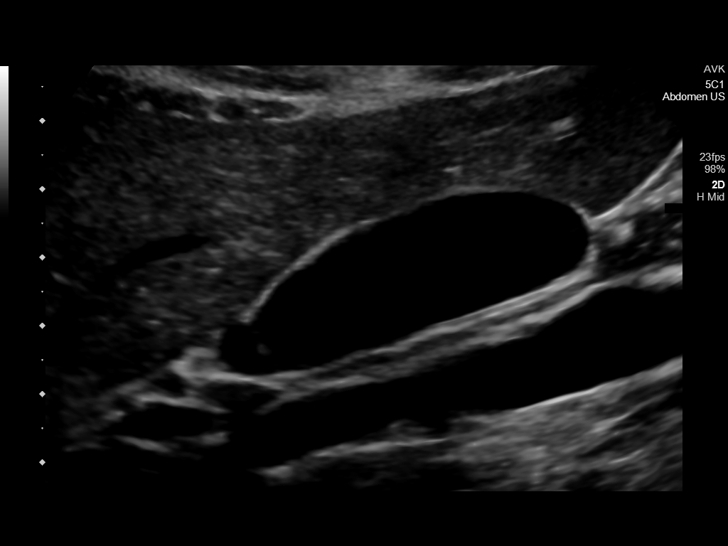
[im 14/82]
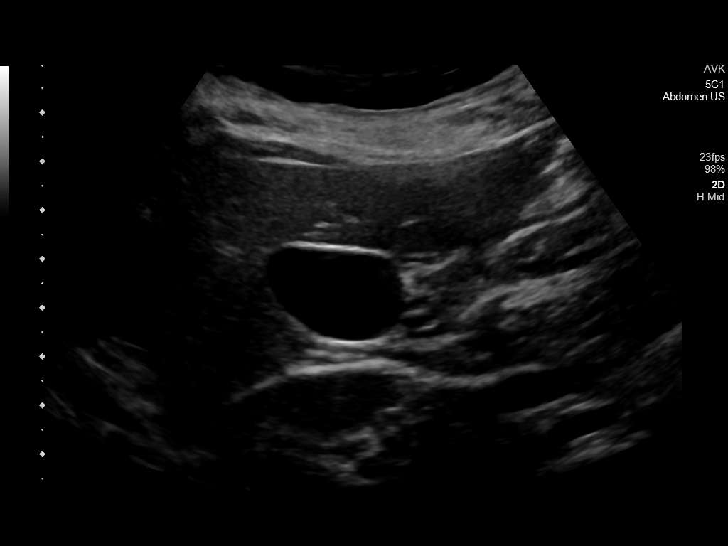
[im 21/82]
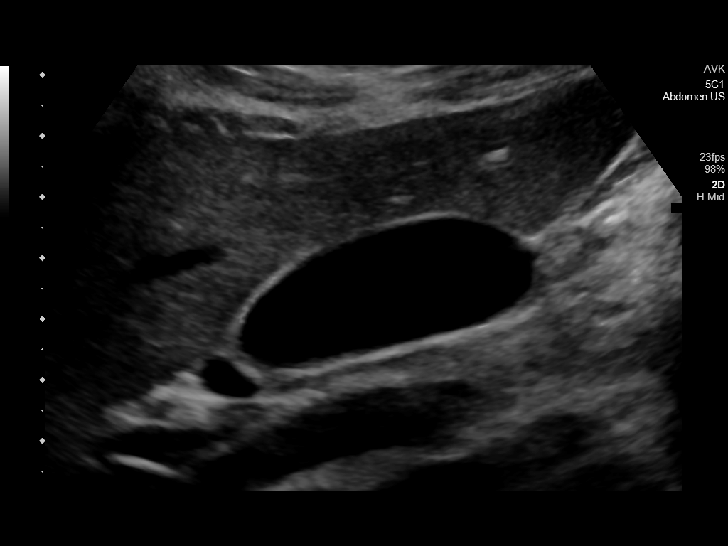
[im 28/82]
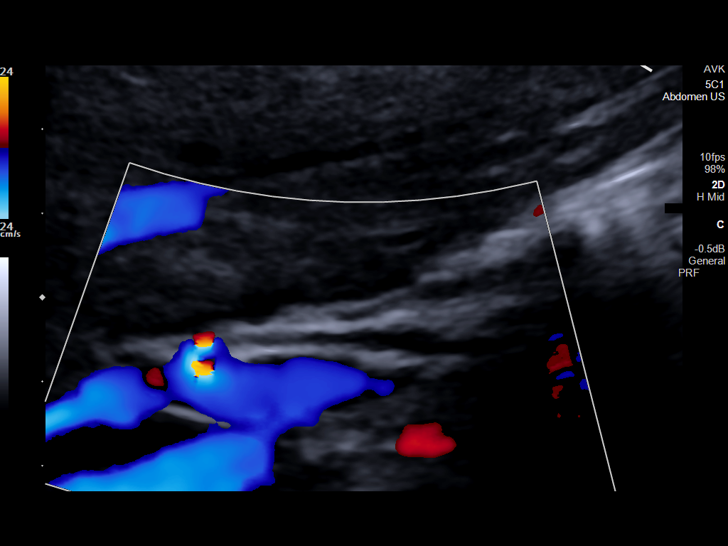
[im 31/82]
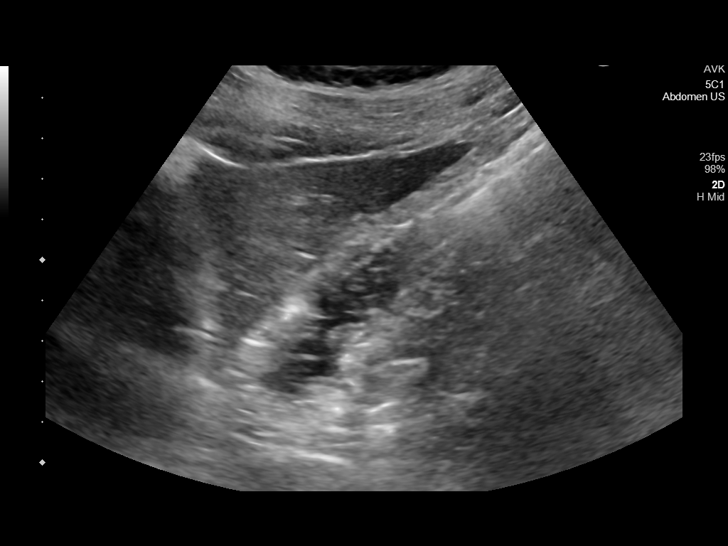
[im 38/82]
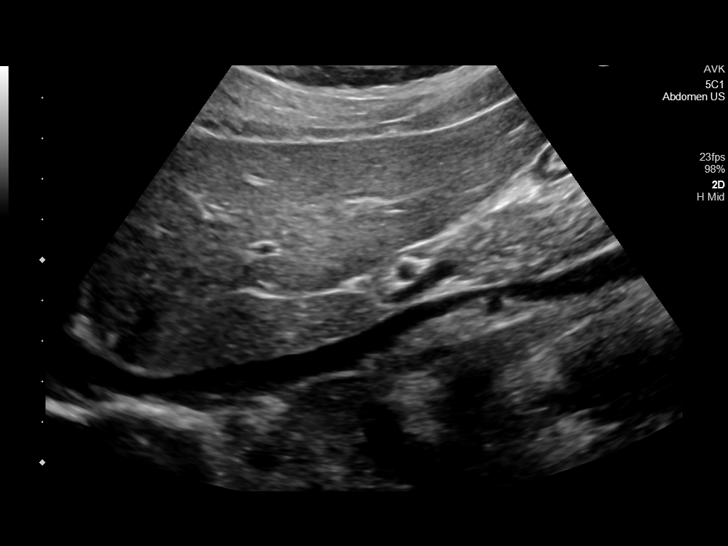
[im 44/82]
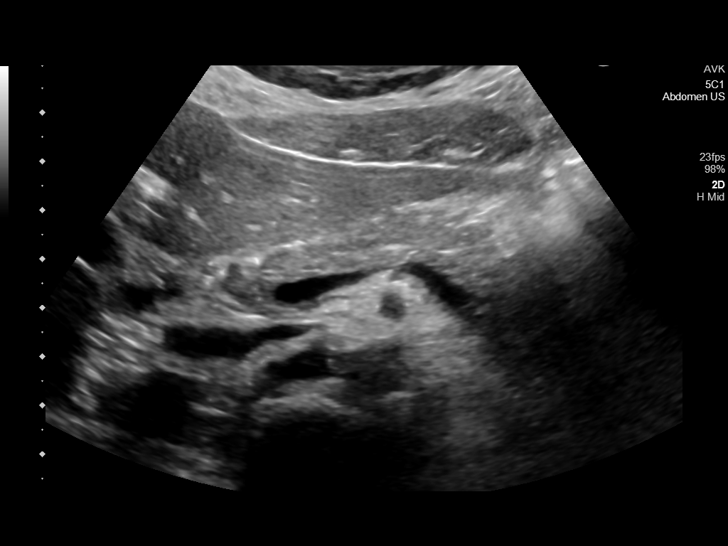
[im 51/82]
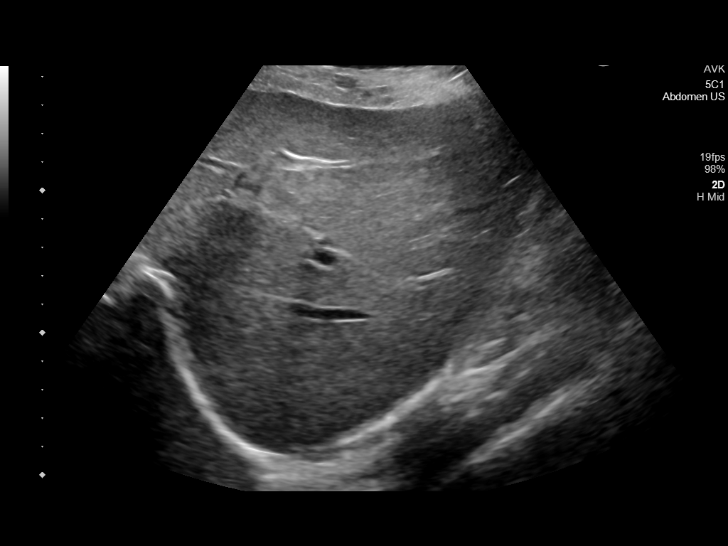
[im 55/82]
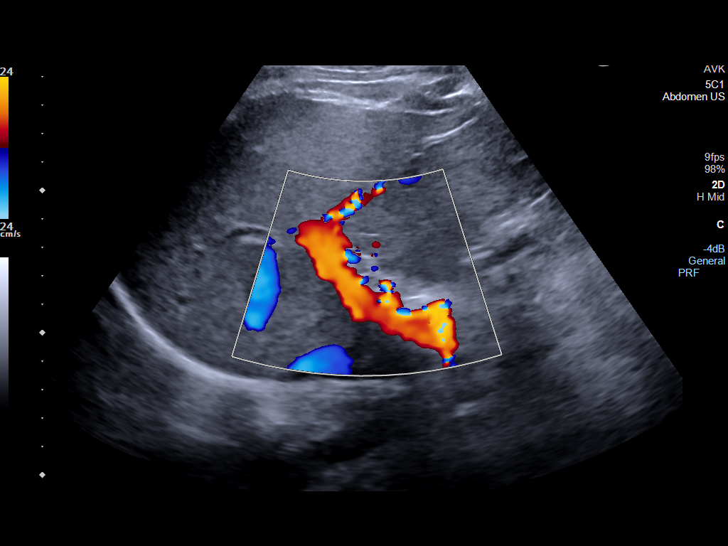
[im 61/82]
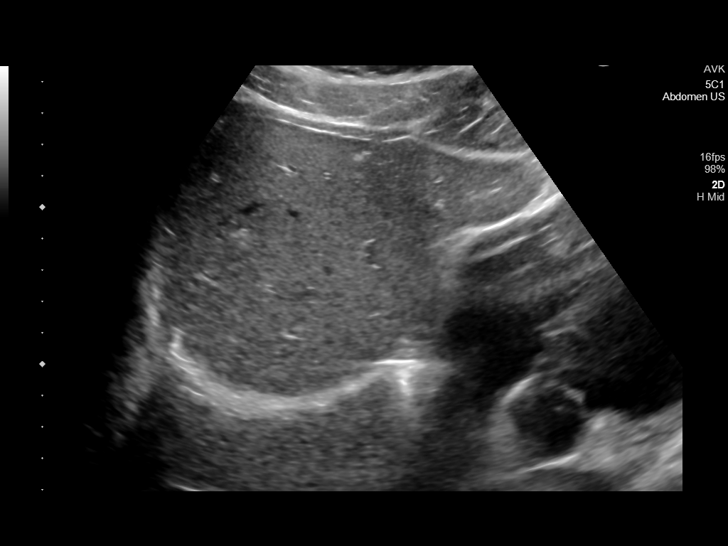
[im 68/82]
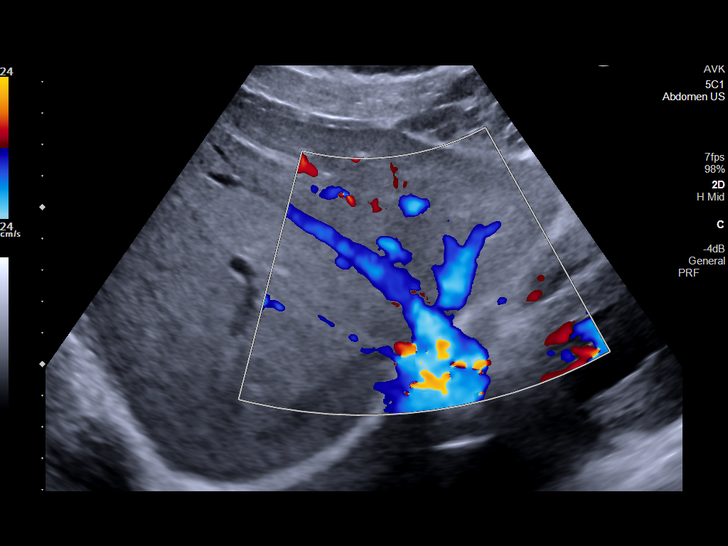
[im 75/82]
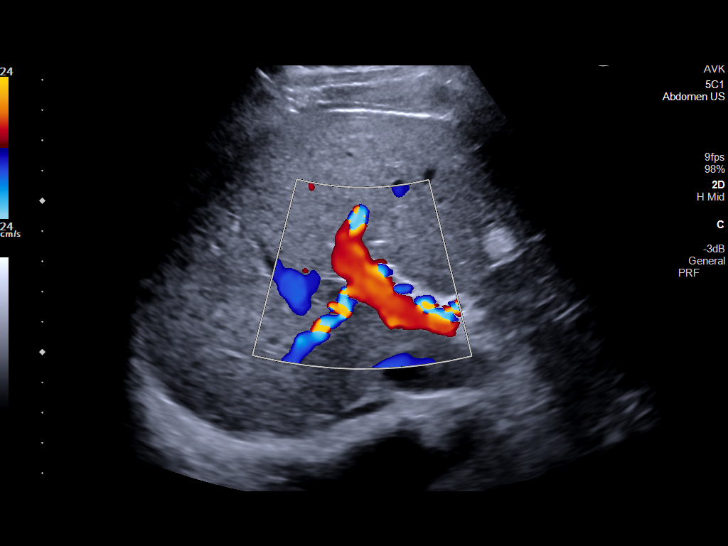
[im 82/82]
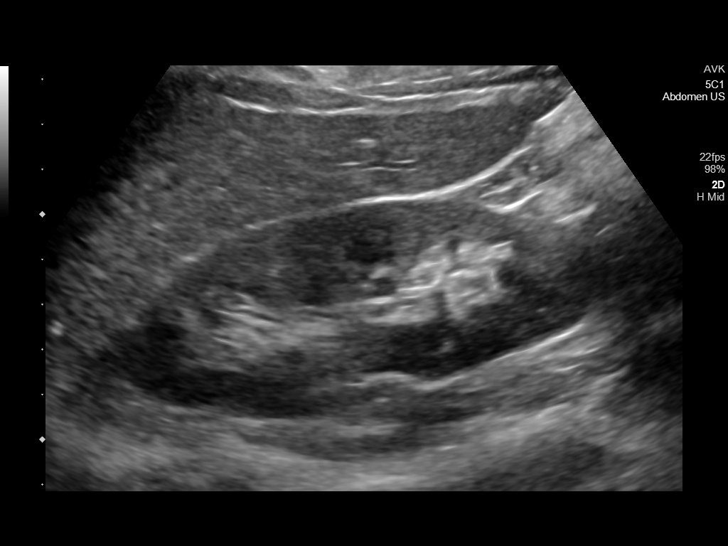

[14 of 25 positions shown; findings below may reference images not displayed]

FINDINGS: Gallbladder:

No gallstones or wall thickening visualized. No sonographic Murphy
sign noted by sonographer.

Common bile duct:

Diameter: 3 mm

Liver:

No focal lesion identified. Within normal limits in parenchymal
echogenicity. Portal vein is patent on color Doppler imaging with
normal direction of blood flow towards the liver.

Other: None.
IMPRESSION: Negative examination

## 2022-04-16 ENCOUNTER — Other Ambulatory Visit: Payer: Self-pay | Admitting: Dermatology

## 2022-04-16 DIAGNOSIS — L700 Acne vulgaris: Secondary | ICD-10-CM

## 2022-06-16 ENCOUNTER — Ambulatory Visit (INDEPENDENT_AMBULATORY_CARE_PROVIDER_SITE_OTHER): Payer: Medicaid Other | Admitting: Dermatology

## 2022-06-16 VITALS — BP 105/66 | HR 95

## 2022-06-16 DIAGNOSIS — L700 Acne vulgaris: Secondary | ICD-10-CM

## 2022-06-16 DIAGNOSIS — L669 Cicatricial alopecia, unspecified: Secondary | ICD-10-CM | POA: Diagnosis not present

## 2022-06-16 NOTE — Patient Instructions (Addendum)
Continue Winlevi twice daily, Arazlo lotion twice per week sandwiching with moisturizer and Amzeeq foam, Spironolactone 100 mg one tablet at night.   Spironolactone can cause increased urination and cause blood pressure to decrease. Please watch for signs of lightheadedness and be cautious when changing position. It can sometimes cause breast tenderness or an irregular period in premenopausal women. It can also increase potassium. The increase in potassium usually is not a concern unless you are taking other medicines that also increase potassium, so please be sure your doctor knows all of the other medications you are taking. This medication should not be taken by pregnant women.  This medicine should also not be taken together with sulfa drugs like Bactrim (trimethoprim/sulfamethexazole).   Topical retinoid medications like tretinoin/Retin-A, adapalene/Differin, tazarotene/Fabior, and Epiduo/Epiduo Forte can cause dryness and irritation when first started. Only apply a pea-sized amount to the entire affected area. Avoid applying it around the eyes, edges of mouth and creases at the nose. If you experience irritation, use a good moisturizer first and/or apply the medicine less often. If you are doing well with the medicine, you can increase how often you use it until you are applying every night. Be careful with sun protection while using this medication as it can make you sensitive to the sun. This medicine should not be used by pregnant women.   Due to recent changes in healthcare laws, you may see results of your pathology and/or laboratory studies on MyChart before the doctors have had a chance to review them. We understand that in some cases there may be results that are confusing or concerning to you. Please understand that not all results are received at the same time and often the doctors may need to interpret multiple results in order to provide you with the best plan of care or course of treatment.  Therefore, we ask that you please give us 2 business days to thoroughly review all your results before contacting the office for clarification. Should we see a critical lab result, you will be contacted sooner.   If You Need Anything After Your Visit  If you have any questions or concerns for your doctor, please call our main line at [phone] and press option 4 to reach your doctor's medical assistant. If no one answers, please leave a voicemail as directed and we will return your call as soon as possible. Messages left after 4 pm will be answered the following business day.   You may also send us a message via MyChart. We typically respond to MyChart messages within 1-2 business days.  For prescription refills, please ask your pharmacy to contact our office. Our fax number is [phone].  If you have an urgent issue when the clinic is closed that cannot wait until the next business day, you can page your doctor at the number below.    Please note that while we do our best to be available for urgent issues outside of office hours, we are not available 24/7.   If you have an urgent issue and are unable to reach us, you may choose to seek medical care at your doctor's office, retail clinic, urgent care center, or emergency room.  If you have a medical emergency, please immediately call 911 or go to the emergency department.  Pager Numbers  - Dr. Gwen PoundsKowalski: [phone]  - Dr. Neale BurlyMoye: [phone]  - Dr. Roseanne RenoStewart: [phone]  In the event of inclement weather, please call our main line at [phone] for an update on  the status of any delays or closures.  Dermatology Medication Tips: Please keep the boxes that topical medications come in in order to help keep track of the instructions about where and how to use these. Pharmacies typically print the medication instructions only on the boxes and not directly on the medication tubes.   If your medication is too expensive, please  contact our office at [phone] option 4 or send [place] a message through MyChart.   We are unable to tell what your co-pay for medications will be in advance as this is different depending on your insurance coverage. However, we may be able to find a substitute medication at lower cost or fill out paperwork to get insurance to cover a needed medication.   If a prior authorization is required to get your medication covered by your insurance company, please allow [place] 1-2 business days to complete this process.  Drug prices often vary depending on where the prescription is filled and some pharmacies may offer cheaper prices.  The website www.goodrx.com contains coupons for medications through different pharmacies. The prices here do not account for what the cost may be with help from insurance (it may be cheaper with your insurance), but the website can give you the price if you did not use any insurance.  - You can print the associated coupon and take it with your prescription to the pharmacy.  - You may also stop by our office during regular business hours and pick up a GoodRx coupon card.  - If you need your prescription sent electronically to a different pharmacy, notify our office through [facility] or by phone at [phone] option 4.     Si Usted Necesita Algo Despus de Su Visita  Tambin puede enviarnos un mensaje a travs de Clinical cytogeneticist. Por lo general respondemos a los mensajes de MyChart en el transcurso de 1 a 2 das hbiles.  Para renovar recetas, por favor pida a su farmacia que se ponga en contacto con nuestra oficina. Annie Sable de fax es [place] [phone].  Si tiene un asunto urgente cuando la clnica est cerrada y que no puede esperar hasta el siguiente da hbil, puede llamar/localizar a su doctor(a) al nmero que aparece a continuacin.   Por favor, tenga en cuenta que aunque hacemos todo lo posible para estar disponibles para asuntos urgentes fuera del horario de  [place], no estamos disponibles las 24 horas del da, los 7 [address]  [address] de la [place].   Si tiene un problema urgente y no puede comunicarse con nosotros, puede optar por buscar atencin mdica  en el consultorio de su doctor(a), en una clnica privada, en un centro de atencin urgente o en una sala de emergencias.  Si tiene Engineer, drilling, por favor llame inmediatamente al 911 o vaya a la sala de emergencias.  Nmeros de bper  - Dr. Gwen Pounds: [phone]  - Dra. Moye: [phone]  - Dra. Roseanne Reno: [phone]  En caso de inclemencias del [place], por favor llame a Lacy Duverney principal al [phone] para una actualizacin sobre el [place] de cualquier retraso o cierre.  Consejos para la medicacin en dermatologa: Por favor, guarde las cajas en las que vienen los medicamentos de uso tpico para ayudarle a seguir las instrucciones sobre dnde y cmo usarlos. Las farmacias generalmente imprimen las instrucciones del medicamento slo en las cajas y no directamente en los tubos del [place].   Si su medicamento es muy caro, por favor, pngase en contacto con nuestra oficina llamando al [phone]  y presione la opcin 4 o envenos un mensaje a travs de Clinical cytogeneticist.   No podemos decirle cul ser su copago por los medicamentos por adelantado ya que esto es diferente dependiendo de la cobertura de su seguro. Sin embargo, es posible que podamos encontrar un medicamento sustituto a Audiological scientist un formulario para que el seguro cubra el medicamento que se considera necesario.   Si se requiere una autorizacin previa para que su compaa de seguros [place] su medicamento, por favor permtanos de 1 a 2 das hbiles para completar [address]  Los precios de los medicamentos varan con frecuencia dependiendo del Environmental consultant de dnde se surte la receta y alguna farmacias pueden ofrecer precios ms baratos.  El sitio web www.goodrx.com tiene cupones para medicamentos de Health and safety inspector. Los  precios aqu no tienen en cuenta lo que podra costar con la ayuda del seguro (puede ser ms barato con su seguro), pero el sitio web puede darle el precio si no utiliz Tourist information centre manager.  - Puede imprimir el cupn correspondiente y llevarlo con su receta a la farmacia.  - Tambin puede pasar por nuestra oficina durante el horario de atencin regular y Education officer, museum una tarjeta de cupones de GoodRx.  - Si necesita que su receta se enve electrnicamente a una farmacia diferente, informe a nuestra oficina a travs de MyChart de [place] o por telfono llamando al [phone] y presione la opcin 4.

## 2022-06-16 NOTE — Progress Notes (Signed)
   Follow-Up Visit   Subjective  Tamara Flynn is a 27 y.o. female who presents for the following: Acne Vulgaris - Winlevi BID, Amzeeq QD, Clindamycin/BP QD, and Spironolactone 100 mg po QHS - Pt was unable to get the Tretinoin due to the prescription being transferred to another pharmacy, and he being unsure who to contact.  The following portions of the chart were reviewed this encounter and updated as appropriate: medications, allergies, medical history  Review of Systems:  No other skin or systemic complaints except as noted in HPI or Assessment and Plan.  Objective  Well appearing patient in no apparent distress; mood and affect are within normal limits.  Areas Examined: Face, chest and back  Relevant exam findings are noted in the Assessment and Plan.   Assessment & Plan    ACNE VULGARIS Exam: Open comedones and inflammatory papules  Chronic and persistent condition with duration or expected duration over one year. Condition is bothersome/symptomatic for patient. Currently flared.  Treatment Plan:  Continue Winlevi cream BID (get new tube every month), and Amzeeq foam QHS.   Discussed increasing Spironolactone to 150 mg po QHS (1 1/2 tabs of ), but pt defers, will continue Spironolactone 100 mg po QHS. Consider Doxycycline and discussed side effects   Discussed Isotretinoin, but pt defers at this time.   D/C Clindamycin/BP mix due to possible benzene exposure with BP  Start Arazlo lotion using twice per week to see how patient tolerates it. May increase to QHS as tolerated.   Topical retinoid medications like tretinoin/Retin-A, adapalene/Differin, tazarotene/Fabior, and Epiduo/Epiduo Forte can cause dryness and irritation when first started. Only apply a pea-sized amount to the entire affected area. Avoid applying it around the eyes, edges of mouth and creases at the nose. If you experience irritation, use a good moisturizer first and/or apply the medicine less  often. If you are doing well with the medicine, you can increase how often you use it until you are applying every night. Be careful with sun protection while using this medication as it can make you sensitive to the sun. This medicine should not be used by pregnant women. Samples given to patient, she will contact us if well tolerated and send in prescription.   If not doing well with Arazlo samples will try sending in Retin A again.  Can use "sandwich method" with moisturizer - apply moisturizer, then medication, then moisturizer.   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 Currently well-controlled.  This is a chronic, progressive type of scarring hair loss. Early treatment with medicine to calm down inflammation is important to prevent scarring and preserve hair follicles. We have many different treatment options.  There are also pill options we can consider.   Plan: Restart Clobetasol solution QHS PRN flares. Call for flares.  Return in about 3 months (around 09/15/2022) for acne follow up.  Maylene Roes, CMA, am acting as scribe for Darden Dates, MD .   Documentation: I have reviewed the above documentation for accuracy and completeness, and I agree with the above.  Darden Dates, MD

## 2022-06-21 ENCOUNTER — Encounter: Payer: Self-pay | Admitting: Dermatology

## 2022-06-21 DIAGNOSIS — L700 Acne vulgaris: Secondary | ICD-10-CM

## 2022-06-21 MED ORDER — AMZEEQ 4 % EX FOAM: CUTANEOUS | 1 refills | Status: DC

## 2022-06-21 MED ORDER — SPIRONOLACTONE 100 MG PO TABS: 100.0000 mg | ORAL_TABLET | Freq: Every day | ORAL | 1 refills | Status: DC

## 2022-06-21 MED ORDER — WINLEVI 1 % EX CREA: TOPICAL_CREAM | CUTANEOUS | 3 refills | Status: DC

## 2022-06-22 NOTE — Telephone Encounter (Signed)
If not covered, I would recommend switching to clindamycin solution daily as needed and follow with argan oil as the last step before bed if she is getting dry

## 2022-06-28 ENCOUNTER — Other Ambulatory Visit: Payer: Self-pay

## 2022-06-28 MED ORDER — CLINDAMYCIN PHOSPHATE 1 % EX SOLN
CUTANEOUS | 2 refills | Status: DC
Start: 2022-06-28 — End: 2023-11-10

## 2022-06-29 ENCOUNTER — Other Ambulatory Visit: Payer: Self-pay

## 2022-06-29 MED ORDER — ARAZLO 0.045 % EX LOTN: TOPICAL_LOTION | CUTANEOUS | 2 refills | Status: DC

## 2022-06-29 NOTE — Telephone Encounter (Signed)
Yes, please send 

## 2022-09-20 ENCOUNTER — Encounter: Payer: Self-pay | Admitting: Dermatology

## 2022-09-20 ENCOUNTER — Ambulatory Visit (INDEPENDENT_AMBULATORY_CARE_PROVIDER_SITE_OTHER): Payer: Medicaid Other | Admitting: Dermatology

## 2022-09-20 VITALS — BP 108/75 | HR 86

## 2022-09-20 DIAGNOSIS — L669 Cicatricial alopecia, unspecified: Secondary | ICD-10-CM

## 2022-09-20 DIAGNOSIS — L2089 Other atopic dermatitis: Secondary | ICD-10-CM

## 2022-09-20 DIAGNOSIS — L700 Acne vulgaris: Secondary | ICD-10-CM

## 2022-09-20 DIAGNOSIS — L739 Follicular disorder, unspecified: Secondary | ICD-10-CM | POA: Diagnosis not present

## 2022-09-20 MED ORDER — WINLEVI 1 % EX CREA
TOPICAL_CREAM | CUTANEOUS | 3 refills | Status: DC
Start: 2022-09-20 — End: 2023-03-28

## 2022-09-20 MED ORDER — ARAZLO 0.045 % EX LOTN
TOPICAL_LOTION | CUTANEOUS | 2 refills | Status: DC
Start: 2022-09-20 — End: 2023-03-28

## 2022-09-20 MED ORDER — DOXYCYCLINE HYCLATE 20 MG PO TABS
ORAL_TABLET | ORAL | 3 refills | Status: DC
Start: 2022-09-20 — End: 2023-11-10

## 2022-09-20 MED ORDER — KETOCONAZOLE 2 % EX SHAM
MEDICATED_SHAMPOO | CUTANEOUS | 11 refills | Status: DC
Start: 2022-09-20 — End: 2023-09-28

## 2022-09-20 MED ORDER — SPIRONOLACTONE 100 MG PO TABS
100.0000 mg | ORAL_TABLET | Freq: Every day | ORAL | 1 refills | Status: DC
Start: 2022-09-20 — End: 2024-01-18

## 2022-09-20 NOTE — Patient Instructions (Addendum)
For acne   Take 2 tabs doxycycline (20 mg tablet) by mouth once a day. #60, 3 refills  Doxycycline should be taken with food to prevent nausea. Do not lay down for 30 minutes after taking. Be cautious with sun exposure and use good sun protection while on this medication. Pregnant women should not take this medication.   Continue winlevi twice daily Continue amzeeq foam qd Continue spironolactone 100 mg tab by mouth nightly  Spironolactone can cause increased urination and cause blood pressure to decrease. Please watch for signs of lightheadedness and be cautious when changing position. It can sometimes cause breast tenderness or an irregular period in premenopausal women. It can also increase potassium. The increase in potassium usually is not a concern unless you are taking other medicines that also increase potassium, so please be sure your doctor knows all of the other medications you are taking. This medication should not be taken by pregnant women.  This medicine should also not be taken together with sulfa drugs like Bactrim (trimethoprim/sulfamethexazole).     For scalp  Continue clobetasol solution nightly as needed for flares at scalp  Continue fluocinolone acetonide body 0.01 % oil spot treatment to itchy areas of scalp daily to twice daily as needed  Start ketoconazole shampoo - apply 1 times per week, massage into scalp and leave in for 10 minutes before rinsing out   For bumps at lower abdomen  Avoid waxing area  Can use amzeeq foam as needed to bumps     Call or send mychart message if rash returns   Gentle Skin Care Guide  1. Bathe no more than once a day.  2. Avoid bathing in hot water  3. Use a mild soap like Dove, Vanicream, Cetaphil, CeraVe. Can use Lever 2000 or Cetaphil antibacterial soap  4. Use soap only where you need it. On most days, use it under your arms, between your legs, and on your feet. Let the water rinse other areas unless visibly  dirty.  5. When you get out of the bath/shower, use a towel to gently blot your skin dry, don't rub it.  6. While your skin is still a little damp, apply a moisturizing cream such as Vanicream, CeraVe, Cetaphil, Eucerin, Sarna lotion or plain Vaseline Jelly. For hands apply Neutrogena [place] Hand Cream or Excipial Hand Cream.  7. Reapply moisturizer any time you start to itch or feel dry.  8. Sometimes using free and clear laundry detergents can be helpful. Fabric softener sheets should be avoided. Downy Free & Gentle liquid, or any liquid fabric softener that is free of dyes and perfumes, it acceptable to use  9. If your doctor has given you prescription creams you may apply moisturizers over them         Due to recent changes in healthcare laws, you may see results of your pathology and/or laboratory studies on MyChart before the doctors have had a chance to review them. We understand that in some cases there may be results that are confusing or concerning to you. Please understand that not all results are received at the same time and often the doctors may need to interpret multiple results in order to provide you with the best plan of care or course of treatment. Therefore, we ask that you please give [place] 2 business days to thoroughly review all your results before contacting the office for clarification. Should we see a critical lab result, you will be contacted sooner.   If You  Need Anything After Your Visit  If you have any questions or concerns for your doctor, please call our main line at [phone] and press option 4 to reach your doctor's medical assistant. If no one answers, please leave a voicemail as directed and we will return your call as soon as possible. Messages left after 4 pm will be answered the following business day.   You may also send [place] a message via MyChart. We typically respond to MyChart messages within 1-2 business days.  For prescription refills, please  ask your pharmacy to contact our office. Our fax number is [phone].  If you have an urgent issue when the clinic is closed that cannot wait until the next business day, you can page your doctor at the number below.    Please note that while we do our best to be available for urgent issues outside of office hours, we are not available 24/7.   If you have an urgent issue and are unable to reach [place], you may choose to seek medical care at your doctor's office, retail clinic, urgent care center, or emergency room.  If you have a medical emergency, please immediately call 911 or go to the emergency department.  Pager Numbers  - Dr. Gwen Pounds: [phone]  - Dr. Neale Burly: [phone]  - Dr. Roseanne Reno: [phone]  In the event of inclement weather, please call our main line at [phone] for an update on the status of any delays or closures.  Dermatology Medication Tips: Please keep the boxes that topical medications come in in order to help keep track of the instructions about where and how to use these. Pharmacies typically print the medication instructions only on the boxes and not directly on the medication tubes.   If your medication is too expensive, please contact our office at [phone] option 4 or send [place] a message through MyChart.   We are unable to tell what your co-pay for medications will be in advance as this is different depending on your insurance coverage. However, we may be able to find a substitute medication at lower cost or fill out paperwork to get insurance to cover a needed medication.   If a prior authorization is required to get your medication covered by your insurance company, please allow [place] 1-2 business days to complete this process.  Drug prices often vary depending on where the prescription is filled and some pharmacies may offer cheaper prices.  The website www.goodrx.com contains coupons for medications through different pharmacies. The prices here do  not account for what the cost may be with help from insurance (it may be cheaper with your insurance), but the website can give you the price if you did not use any insurance.  - You can print the associated coupon and take it with your prescription to the pharmacy.  - You may also stop by our office during regular business hours and pick up a GoodRx coupon card.  - If you need your prescription sent electronically to a different pharmacy, notify our office through [facility] or by phone at [phone] option 4.     Si Usted Necesita Algo Despus de Su Visita  Tambin puede enviarnos un mensaje a travs de Clinical cytogeneticist. Por lo general respondemos a los mensajes de MyChart en el transcurso de 1 a 2 das hbiles.  Para renovar recetas, por favor pida a su farmacia que se ponga en contacto con nuestra oficina. Annie Sable de fax es [place] [phone].  Si tiene un  asunto urgente cuando la clnica est cerrada y que no puede esperar hasta el siguiente da hbil, puede llamar/localizar a su doctor(a) al nmero que aparece a continuacin.   Por favor, tenga en cuenta que aunque hacemos todo lo posible para estar disponibles para asuntos urgentes fuera del horario de [place], no estamos disponibles las 24 horas del da, los 7 [address]  [address] de la [place].   Si tiene un problema urgente y no puede comunicarse con nosotros, puede optar por buscar atencin mdica  en el consultorio de su doctor(a), en una clnica privada, en un centro de atencin urgente o en una sala de emergencias.  Si tiene Engineer, drilling, por favor llame inmediatamente al 911 o vaya a la sala de emergencias.  Nmeros de bper  - Dr. Gwen Pounds: [phone]  - Dra. Moye: [phone]  - Dra. Roseanne Reno: [phone]  En caso de inclemencias del [place], por favor llame a Lacy Duverney principal al [phone] para una actualizacin sobre el [place] de cualquier retraso o cierre.  Consejos para la medicacin en dermatologa: Por  favor, guarde las cajas en las que vienen los medicamentos de uso tpico para ayudarle a seguir las instrucciones sobre dnde y cmo usarlos. Las farmacias generalmente imprimen las instrucciones del medicamento slo en las cajas y no directamente en los tubos del [place].   Si su medicamento es muy caro, por favor, pngase en contacto con Rolm Gala llamando al [phone] y presione la opcin 4 o envenos un mensaje a travs de Clinical cytogeneticist.   No podemos decirle cul ser su copago por los medicamentos por adelantado ya que esto es diferente dependiendo de la cobertura de su seguro. Sin embargo, es posible que podamos encontrar un medicamento sustituto a Audiological scientist un formulario para que el seguro cubra el medicamento que se considera necesario.   Si se requiere una autorizacin previa para que su compaa de seguros [place] su medicamento, por favor permtanos de 1 a 2 das hbiles para completar [address]  Los precios de los medicamentos varan con frecuencia dependiendo del Environmental consultant de dnde se surte la receta y alguna farmacias pueden ofrecer precios ms baratos.  El sitio web www.goodrx.com tiene cupones para medicamentos de Health and safety inspector. Los precios aqu no tienen en cuenta lo que podra costar con la ayuda del seguro (puede ser ms barato con su seguro), pero el sitio web puede darle el precio si no utiliz Tourist information centre manager.  - Puede imprimir el cupn correspondiente y llevarlo con su receta a la farmacia.  - Tambin puede pasar por nuestra oficina durante el horario de atencin regular y Education officer, museum una tarjeta de cupones de GoodRx.  - Si necesita que su receta se enve electrnicamente a una farmacia diferente, informe a nuestra oficina a travs de MyChart de [place] o por telfono llamando al [phone] y presione la opcin 4.

## 2022-09-20 NOTE — Progress Notes (Signed)
Follow-Up Visit   Subjective  Tamara Flynn is a 27 y.o. female who presents for the following: Acne Vulgaris still having flares at face, currently using  winlevi, amzeeq foam qhs, spirolactone 100 mg po qhs, and Arazlo lotion using twice week. Patient also reports itchy rash at body that started 2 weeks ago. Reports rash has improved and not using any treatment. She has h/o eczema as a child.  Follow up on LP vs CCCA at scalp - using fluocinonide oil and clobetasol solution as needed.   Reports some bumps at lower abdomen.    The following portions of the chart were reviewed this encounter and updated as appropriate: medications, allergies, medical history  Review of Systems:  No other skin or systemic complaints except as noted in HPI or Assessment and Plan.  Objective  Well appearing patient in no apparent distress; mood and affect are within normal limits.  Areas Examined: Face, chest and back, lower abdomen, scalp  Relevant exam findings are noted in the Assessment and Plan.   Assessment & Plan   Scarring alopecia  Related Medications ketoconazole (NIZORAL) 2 % shampoo apply 1 time per week, massage into scalp and leave in for 10 minutes before rinsing out  Acne vulgaris  Related Medications Tretinoin Microsphere (RETIN-A MICRO PUMP) 0.06 % GEL Apply to face nightly as tolerated  Minocycline HCl Micronized (AMZEEQ) 4 % FOAM Use a thin layer topically at night to spot treat affected areas of face for acne.  Clascoterone (WINLEVI) 1 % CREA Apply to face BID  spironolactone (ALDACTONE) 100 MG tablet Take 1 tablet (100 mg total) by mouth at bedtime.  Tazarotene (ARAZLO) 0.045 % LOTN Apply to face nightly as tolerated.  doxycycline (PERIOSTAT) 20 MG tablet Take 2 tabs doxycycline (20 mg tablet) by mouth once a day with food   ACNE VULGARIS Exam: Hyperpigmented macules on cheeks left > than right and closed comedones on forehead   Chronic and persistent  condition with duration or expected duration over one year. Condition is symptomatic/ bothersome to patient. Not currently at goal.    Treatment Plan: Start 2 tabs doxycycline (20 mg tablet) by mouth once a day. #60, 3 refills  Doxycycline should be taken with food to prevent nausea. Do not lay down for 30 minutes after taking. Be cautious with sun exposure and use good sun protection while on this medication. Pregnant women should not take this medication.    Continue Winlevi bid  Continue Amzeeq foam qd Increase Arazlo lotion from 2 x to  3 x weekly as tolerated Continue Spironolactone 100 po qhs  Spironolactone can cause increased urination and cause blood pressure to decrease. Please watch for signs of lightheadedness and be cautious when changing position. It can sometimes cause breast tenderness or an irregular period in premenopausal women. It can also increase potassium. The increase in potassium usually is not a concern unless you are taking other medicines that also increase potassium, so please be sure your doctor knows all of the other medications you are taking. This medication should not be taken by pregnant women.  This medicine should also not be taken together with sulfa drugs like Bactrim (trimethoprim/sulfamethexazole).     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 Condition is symptomatic/ bothersome  to patient. Not currently at goal.    This is a chronic, progressive type of scarring hair loss. Early treatment with medicine to calm down inflammation is important to prevent scarring and preserve hair follicles. We have many different treatment options.  There are also pill options we can consider.    Plan: Start ketoconazole shampoo  apply 1 time per week, massage into scalp and leave in for 10 minutes before rinsing out. This can be followed by conditioner or shampoo and conditioner of your choice.  Continue Clobetasol solution QHS PRN flares.  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 Currently clear   Atopic dermatitis (eczema) is a chronic, relapsing, pruritic condition that can significantly affect quality of life. It is often associated with allergic rhinitis and/or asthma and can require treatment with topical medications, phototherapy, or in severe cases biologic injectable medication (Dupixent; Adbry) or Oral JAK inhibitors.  Treatment Plan: No treatment recommended at this time  Will call if rash returns  Recommend gentle skin care.   FOLLICULITIS Exam: hyperpigmented firm papules at lower abdomen   Treatment Plan: Start amzeeq foam to affected area qd prn (patient has rx for acne) Avoid waxing in that area  Return in about 6 months (around 03/23/2023) for acne.  I, Asher Muir, CMA, am acting as scribe for Willeen Niece, MD.  Documentation: I have reviewed the above documentation for accuracy and completeness, and I agree with the above.  Willeen Niece, MD

## 2023-03-28 ENCOUNTER — Ambulatory Visit (INDEPENDENT_AMBULATORY_CARE_PROVIDER_SITE_OTHER): Payer: Medicaid Other | Admitting: Dermatology

## 2023-03-28 DIAGNOSIS — L700 Acne vulgaris: Secondary | ICD-10-CM | POA: Diagnosis not present

## 2023-03-28 DIAGNOSIS — L661 Lichen planopilaris, unspecified: Secondary | ICD-10-CM

## 2023-03-28 DIAGNOSIS — L659 Nonscarring hair loss, unspecified: Secondary | ICD-10-CM

## 2023-03-28 MED ORDER — SPIRONOLACTONE 100 MG PO TABS
100.0000 mg | ORAL_TABLET | Freq: Every day | ORAL | 1 refills | Status: DC
Start: 2023-03-28 — End: 2023-11-10

## 2023-03-28 MED ORDER — ARAZLO 0.045 % EX LOTN
TOPICAL_LOTION | CUTANEOUS | 2 refills | Status: DC
Start: 2023-03-28 — End: 2024-01-18

## 2023-03-28 MED ORDER — CLOBETASOL PROPIONATE 0.05 % EX SOLN
1.0000 | Freq: Two times a day (BID) | CUTANEOUS | 2 refills | Status: AC
Start: 2023-03-28 — End: ?

## 2023-03-28 MED ORDER — WINLEVI 1 % EX CREA
TOPICAL_CREAM | CUTANEOUS | 3 refills | Status: DC
Start: 2023-03-28 — End: 2024-01-18

## 2023-03-28 NOTE — Progress Notes (Signed)
Follow-Up Visit   Subjective  Tamara Flynn is a 28 y.o. female who presents for the following: follow up on acne pt on winlevi 1% cream, spironolactone 100 mg, arazlo 0.045% lotion, and doxycyline 20 mg tablet. Pt not using amzeeq due to no longer being covered by insurance. Acne is better.  Still gets occasional breakouts with cycle. Bx proven early Lichen planopilaris on ketoconazole shampoo, clobetasol solution, and fluocinolone acetonide scalp 0.01 % oil spot. Has had recent increase hair shedding. No hx recent illness or surgery.   The patient has spots, moles and lesions to be evaluated, some may be new or changing and the patient may have concern these could be cancer.   The following portions of the chart were reviewed this encounter and updated as appropriate: medications, allergies, medical history    Review of Systems:  No other skin or systemic complaints except as noted in HPI or Assessment and Plan.  Objective  Well appearing patient in no apparent distress; mood and affect are within normal limits.  A focused examination was performed of the following areas: Face, scalp   Relevant exam findings are noted in the Assessment and Plan.    Assessment & Plan    ACNE VULGARIS Face Exam: Hyperpigmented macules, resolving inflammatory papule at R mid cheek   Chronic and persistent condition with duration or expected duration over one year. Condition is improving with treatment but not currently at goal.   Treatment Plan: Clascoterone (WINLEVI) 1 % CREA Apply to face BID   Continue spironolactone (ALDACTONE) 100 MG tablet Take 1 tablet (100 mg total) by mouth at bedtime. -Blood pressure: 102/71 Spironolactone can cause increased urination and cause blood pressure to decrease. Please watch for signs of lightheadedness and be cautious when changing position. It can sometimes cause breast tenderness or an irregular period in premenopausal women. It can also increase  potassium. The increase in potassium usually is not a concern unless you are taking other medicines that also increase potassium, so please be sure your doctor knows all of the other medications you are taking. This medication should not be taken by pregnant women.  This medicine should also not be taken together with sulfa drugs like Bactrim (trimethoprim/sulfamethexazole).     Tazarotene (ARAZLO) 0.045 % LOTN Apply to face nightly as tolerated.   doxycycline (PERIOSTAT) 20 MG tablet Take 2 tabs doxycycline (20 mg tablet) by mouth once a day with food   Doxycycline should be taken with food to prevent nausea. Do not lay down for 30 minutes after taking. Be cautious with sun exposure and use good sun protection while on this medication. Pregnant women should not take this medication.    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 Condition is symptomatic / bothersome to patient. Not to goal, but stable without progression.  Treatment Plan:  Discussed starting oral Minoxidil 2.5mg , pt defers at this time Continue Clobetasol solution QHS PRN itchy scalp.   Topical steroids (such as triamcinolone, fluocinolone, fluocinonide, mometasone, clobetasol, halobetasol, betamethasone, hydrocortisone) can cause thinning and lightening of the skin if they are used for too long in the same area. Your physician has selected the right strength medicine for your problem and area affected on the body. Please use your medication only as directed by your physician to prevent  side effects.    Continue fluocinolone acetonide body 0.01 % oil spot treatment to aa at scalp qd/bid prn. Overnight scalp treatment before weekly hair wash.   Continue Ketoconazole 2% shampoo, recommend increasing to once a week, massage  it in leave in for 10 minutes before rinsing.    Return in about 6 months (around 09/25/2023) for acne & alopecia w/ Dr. Roseanne Reno.  I, Soundra Pilon, CMA, am acting as scribe for Willeen Niece, MD .   Documentation: I have reviewed the above documentation for accuracy and completeness, and I agree with the above.  Willeen Niece, MD

## 2023-03-28 NOTE — Patient Instructions (Addendum)
Doses of minoxidil for hair loss are considered 'low dose'. This is because the doses used for hair loss are much lower than the doses which are used for conditions such as high blood pressure (hypertension). The doses used for hypertension are 10-40mg  per day.  Side effects are uncommon at the low doses (up to 2.5 mg/day) used to treat hair loss. Potential side effects, more commonly seen at higher doses, include: Increase in hair growth (hypertrichosis) elsewhere on face and body Temporary hair shedding upon starting medication which may last up to 4 weeks Ankle swelling, fluid retention, rapid weight gain more than 5 pounds Low blood pressure and feeling lightheaded or dizzy when standing up quickly Fast or irregular heartbeat Headaches   Due to recent changes in healthcare laws, you may see results of your pathology and/or laboratory studies on MyChart before the doctors have had a chance to review them. We understand that in some cases there may be results that are confusing or concerning to you. Please understand that not all results are received at the same time and often the doctors may need to interpret multiple results in order to provide you with the best plan of care or course of treatment. Therefore, we ask that you please give [place] 2 business days to thoroughly review all your results before contacting the office for clarification. Should we see a critical lab result, you will be contacted sooner.   If You Need Anything After Your Visit  If you have any questions or concerns for your doctor, please call our main line at [phone] and press option 4 to reach your doctor's medical assistant. If no one answers, please leave a voicemail as directed and we will return your call as soon as possible. Messages left after 4 pm will be answered the following business day.   You may also send [place] a message via MyChart. We typically respond to MyChart messages within 1-2 business days.  For  prescription refills, please ask your pharmacy to contact our office. Our fax number is [phone].  If you have an urgent issue when the clinic is closed that cannot wait until the next business day, you can page your doctor at the number below.    Please note that while we do our best to be available for urgent issues outside of office hours, we are not available 24/7.   If you have an urgent issue and are unable to reach [place], you may choose to seek medical care at your doctor's office, retail clinic, urgent care center, or emergency room.  If you have a medical emergency, please immediately call 911 or go to the emergency department.  Pager Numbers  - Dr. Gwen Pounds: [phone]  - Dr. Roseanne Reno: [phone]  - Dr. Katrinka Blazing: [phone]   In the event of inclement weather, please call our main line at [phone] for an update on the status of any delays or closures.  Dermatology Medication Tips: Please keep the boxes that topical medications come in in order to help keep track of the instructions about where and how to use these. Pharmacies typically print the medication instructions only on the boxes and not directly on the medication tubes.   If your medication is too expensive, please contact our office at [phone] option 4 or send [place] a message through MyChart.   We are unable to tell what your co-pay for medications will be in advance as this is different depending on your insurance coverage. However, we may be able  to find a substitute medication at lower cost or fill out paperwork to get insurance to cover a needed medication.   If a prior authorization is required to get your medication covered by your insurance company, please allow [place] 1-2 business days to complete this process.  Drug prices often vary depending on where the prescription is filled and some pharmacies may offer cheaper prices.  The website www.goodrx.com contains coupons for medications through different  pharmacies. The prices here do not account for what the cost may be with help from insurance (it may be cheaper with your insurance), but the website can give you the price if you did not use any insurance.  - You can print the associated coupon and take it with your prescription to the pharmacy.  - You may also stop by our office during regular business hours and pick up a GoodRx coupon card.  - If you need your prescription sent electronically to a different pharmacy, notify our office through [facility] or by phone at [phone] option 4.     Si Usted Necesita Algo Despus de Su Visita  Tambin puede enviarnos un mensaje a travs de Clinical cytogeneticist. Por lo general respondemos a los mensajes de MyChart en el transcurso de 1 a 2 das hbiles.  Para renovar recetas, por favor pida a su farmacia que se ponga en contacto con nuestra oficina. Annie Sable de fax es [place] [phone].  Si tiene un asunto urgente cuando la clnica est cerrada y que no puede esperar hasta el siguiente da hbil, puede llamar/localizar a su doctor(a) al nmero que aparece a continuacin.   Por favor, tenga en cuenta que aunque hacemos todo lo posible para estar disponibles para asuntos urgentes fuera del horario de [place], no estamos disponibles las 24 horas del da, los 7 [address]  [address] de la [place].   Si tiene un problema urgente y no puede comunicarse con nosotros, puede optar por buscar atencin mdica  en el consultorio de su doctor(a), en una clnica privada, en un centro de atencin urgente o en una sala de emergencias.  Si tiene Engineer, drilling, por favor llame inmediatamente al 911 o vaya a la sala de emergencias.  Nmeros de bper  - Dr. Gwen Pounds: [phone]  - Dra. Roseanne Reno: [phone]  - Dr. Katrinka Blazing: [phone]   En caso de inclemencias del tiempo, por favor llame a Lacy Duverney principal al [phone] para una actualizacin sobre el [place] de cualquier retraso o cierre.  Consejos para la  medicacin en dermatologa: Por favor, guarde las cajas en las que vienen los medicamentos de uso tpico para ayudarle a seguir las instrucciones sobre dnde y cmo usarlos. Las farmacias generalmente imprimen las instrucciones del medicamento slo en las cajas y no directamente en los tubos del [place].   Si su medicamento es muy caro, por favor, pngase en contacto con Rolm Gala llamando al [phone] y presione la opcin 4 o envenos un mensaje a travs de Clinical cytogeneticist.   No podemos decirle cul ser su copago por los medicamentos por adelantado ya que esto es diferente dependiendo de la cobertura de su seguro. Sin embargo, es posible que podamos encontrar un medicamento sustituto a Audiological scientist un formulario para que el seguro cubra el medicamento que se considera necesario.   Si se requiere una autorizacin previa para que su compaa de seguros [place] su medicamento, por favor permtanos de 1 a 2 das hbiles para completar [address]  Los precios de los medicamentos varan con  frecuencia dependiendo del lugar de dnde se surte la receta y alguna farmacias pueden ofrecer precios ms baratos.  El sitio web www.goodrx.com tiene cupones para medicamentos de Health and safety inspector. Los precios aqu no tienen en cuenta lo que podra costar con la ayuda del seguro (puede ser ms barato con su seguro), pero el sitio web puede darle el precio si no utiliz Tourist information centre manager.  - Puede imprimir el cupn correspondiente y llevarlo con su receta a la farmacia.  - Tambin puede pasar por nuestra oficina durante el horario de atencin regular y Education officer, museum una tarjeta de cupones de GoodRx.  - Si necesita que su receta se enve electrnicamente a una farmacia diferente, informe a nuestra oficina a travs de MyChart de [place] o por telfono llamando al [phone] y presione la opcin 4.

## 2023-04-04 ENCOUNTER — Encounter: Payer: Self-pay | Admitting: Dermatology

## 2023-09-27 ENCOUNTER — Encounter: Payer: Self-pay | Admitting: Dermatology

## 2023-09-27 ENCOUNTER — Ambulatory Visit (INDEPENDENT_AMBULATORY_CARE_PROVIDER_SITE_OTHER): Payer: Medicaid Other | Admitting: Dermatology

## 2023-09-27 DIAGNOSIS — L661 Lichen planopilaris, unspecified: Secondary | ICD-10-CM

## 2023-09-27 DIAGNOSIS — L700 Acne vulgaris: Secondary | ICD-10-CM

## 2023-09-27 DIAGNOSIS — L811 Chloasma: Secondary | ICD-10-CM | POA: Diagnosis not present

## 2023-09-27 DIAGNOSIS — L810 Postinflammatory hyperpigmentation: Secondary | ICD-10-CM

## 2023-09-27 MED ORDER — AMBULATORY NON FORMULARY MEDICATION: 3 refills | Status: AC

## 2023-09-27 NOTE — Progress Notes (Signed)
 Follow-Up Visit   Subjective  Tamara Flynn is a 28 y.o. female who presents for the following: follow up on acne pt on winlevi  1% cream, spironolactone  100 mg, arazlo  0.045% lotion, and doxycyline 20 mg tablet. Still gets occasional breakouts with cycle but started Depo-Provera in February to help with hormonal breakouts but hasn't helped. Seems to worsen during the summer.  Uses sunscreen, feels skin is more sensitive to the sun. Is there anything else she can use to help with this. Uses Black Girl Magic sunscreen.   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 States hair seems shorter in middle of scalp. More like hair breakage than thinning at this point. Is trying to do gentle hair care and simple styling.  Trying to avoid heat to hair.   The patient has spots, moles and lesions to be evaluated, some may be new or changing and the patient may have concern these could be cancer.   The following portions of the chart were reviewed this encounter and updated as appropriate: medications, allergies, medical history    Review of Systems:  No other skin or systemic complaints except as noted in HPI or Assessment and Plan.  Objective  Well appearing patient in no apparent distress; mood and affect are within normal limits.  A focused examination was performed of the following areas: Face, scalp   Relevant exam findings are noted in the Assessment and Plan.    Assessment & Plan    ACNE VULGARIS with PIH Face Exam: few inflammatory papules at left cheek, , closed comedones, hyperpigmented macules at cheeks and forehead  Chronic and persistent condition with duration or expected duration over one year. Condition is improving with treatment but not currently at goal.   Treatment Plan: Continue WINLEVI  1 % cream increase to twice a day to face  Continue  Arazlo  at bedtime as tolerated apply after Winlevi .   Stop doxycycline  2 x 20 MG tablet   Continue  spironolactone  (ALDACTONE ) 100 MG tablet Take 1 tablet (100 mg total) by mouth at bedtime. -Blood pressure: 115/77  Spironolactone  can cause increased urination and cause blood pressure to decrease. Please watch for signs of lightheadedness and be cautious when changing position. It can sometimes cause breast tenderness or an irregular period in premenopausal women. It can also increase potassium. The increase in potassium usually is not a concern unless you are taking other medicines that also increase potassium, so please be sure your doctor knows all of the other medications you are taking. This medication should not be taken by pregnant women.  This medicine should also not be taken together with sulfa  drugs like Bactrim  (trimethoprim /sulfamethexazole).     Could consider changing birth control from Depo-provera injection to YAZ OCP     MELASMA Exam: reticulated hyperpigmented patches at cheeks and forehead.  Chronic and persistent condition with duration or expected duration over one year. Condition is bothersome/symptomatic for patient. Currently flared.   Melasma is a chronic; persistent condition of hyperpigmented patches generally on the face, worse in summer due to higher UV exposure.    Heredity; thyroid  disease; sun exposure; pregnancy; birth control pills; epilepsy medication and darker skin may predispose to Melasma.   Recommendations include: - Sun avoidance and daily broad spectrum (UVA/UVB) tinted mineral sunscreen SPF 30+, with Zinc or Titanium Dioxide. - Rx topical bleaching creams (i.e. hydroquinone) is a common treatment but should not be used long term.  Hydroquinones may be mixed with retinoids; vitamin C; steroids;  Kojic Acid. - Alastin A-luminate, retinoids, vitamin C, topical tranexamic acid, glycolic acid and kojic acid can be used for brightening while on break from hydroquinone - Rx Azelaic Acid  is also a treatment option that is safe for pregnancy (Category B). -  OTC Heliocare can be helpful in control and prevention. - Oral Rx with Tranexamic Acid 250 mg - 650 mg po daily can be used for moderate to severe cases especially during summer (contraindications include pregnancy; lactation; hx of PE; hx of DVT; clotting disorder; heart disease; anticoagulant use and upcoming long trips)   - Chemical peels (would need multiple for best result).  - Lasers and  Microdermabrasion may also be helpful adjunct treatments.  Treatment Plan: Recommend Zinc sunscreen SPF 30+ to sun-exposed areas, reapply every 2 hours as needed. Call for new or changing lesions. Samples of Neutrogena tinted sunscreen given Staying in the shade or wearing long sleeves, sun glasses (UVA+UVB protection) and wide brim hats (4-inch brim around the entire circumference of the hat) are also recommended for sun protection.   Will prescribe Skin Medicinals Hydroquinone 12%/kojic acid/vitamin C cream pea sized amount twice daily to the entire face for up to 3 months. This cannot be used more than 3 months due to risk of exogenous ochronosis (permanent dark spots). The patient was advised this is not covered by insurance since it is made by a compounding pharmacy. They will receive an email to check out and the medication will be mailed to their home.    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 Condition is improving with treatment but not currently at goal, but stable without progression.  Treatment Plan:  Continue Clobetasol  solution QHS PRN itchy scalp.   Topical steroids (such as triamcinolone , fluocinolone , fluocinonide, mometasone, clobetasol , halobetasol, betamethasone , hydrocortisone ) can cause thinning and lightening of the skin if they are used for too long in the same area. Your  physician has selected the right strength medicine for your problem and area affected on the body. Please use your medication only as directed by your physician to prevent side effects.     Continue Ketoconazole  2% shampoo, recommend increasing to once a week, massage it in leave in for 10 minutes before rinsing.     Return in about 3 months (around 12/28/2023) for Acne Follow Up, Alopecia Follow Up.  I, Kate Fought, CMA, am acting as scribe for Rexene Rattler, MD.   Documentation: I have reviewed the above documentation for accuracy and completeness, and I agree with the above.  Rexene Rattler, MD

## 2023-09-27 NOTE — Patient Instructions (Addendum)
 Melasma: Will prescribe Skin Medicinals Hydroquinone 12%/kojic acid/vitamin C cream pea sized amount twice daily to the entire face for up to 3 months. This cannot be used more than 3 months due to risk of exogenous ochronosis (permanent dark spots). The patient was advised this is not covered by insurance since it is made by a compounding pharmacy. They will receive an email to check out and the medication will be mailed to their home.   Acne:  Continue WINLEVI  1 % cream increase to twice a day to face  Continue Arazlo  at bedtime to face.  Stop doxycycline  20 MG tablet  Continue spironolactone  (ALDACTONE ) 100 MG tablet Take 1 tablet (100 mg total) by mouth at bedtime.  Spironolactone  can cause increased urination and cause blood pressure to decrease. Please watch for signs of lightheadedness and be cautious when changing position. It can sometimes cause breast tenderness or an irregular period in premenopausal women. It can also increase potassium. The increase in potassium usually is not a concern unless you are taking other medicines that also increase potassium, so please be sure your doctor knows all of the other medications you are taking. This medication should not be taken by pregnant women.  This medicine should also not be taken together with sulfa  drugs like Bactrim  (trimethoprim /sulfamethexazole).    Scalp: Continue Ketoconazole  2% shampoo, recommend increasing to once a week, massage it in leave in for 10 minutes before rinsing.  Continue Clobetasol  solution QHS PRN itchy scalp.   Topical steroids (such as triamcinolone , fluocinolone , fluocinonide, mometasone, clobetasol , halobetasol, betamethasone , hydrocortisone ) can cause thinning and lightening of the skin if they are used for too long in the same area. Your physician has selected the right strength medicine for your problem and area affected on the body. Please use your medication only as directed by your physician to prevent  side effects.    Recommended Sunscreen: Isntree Hyaluronic Acid Watery Sun Gel      Due to recent changes in healthcare laws, you may see results of your pathology and/or laboratory studies on MyChart before the doctors have had a chance to review them. We understand that in some cases there may be results that are confusing or concerning to you. Please understand that not all results are received at the same time and often the doctors may need to interpret multiple results in order to provide you with the best plan of care or course of treatment. Therefore, we ask that you please give us  2 business days to thoroughly review all your results before contacting the office for clarification. Should we see a critical lab result, you will be contacted sooner.   If You Need Anything After Your Visit  If you have any questions or concerns for your doctor, please call our main line at [phone] and press option 4 to reach your doctor's medical assistant. If no one answers, please leave a voicemail as directed and we will return your call as soon as possible. Messages left after 4 pm will be answered the following business day.   You may also send us  a message via MyChart. We typically respond to MyChart messages within 1-2 business days.  For prescription refills, please ask your pharmacy to contact our office. Our fax number is [phone].  If you have an urgent issue when the clinic is closed that cannot wait until the next business day, you can page your doctor at the number below.    Please note that while we do our best  to be available for urgent issues outside of office hours, we are not available 24/7.   If you have an urgent issue and are unable to reach us , you may choose to seek medical care at your doctor's office, retail clinic, urgent care center, or emergency room.  If you have a medical emergency, please immediately call 911 or go to the emergency department.  Pager Numbers  -  Dr. Hester: [phone]  - Dr. Jackquline: [phone]  - Dr. Claudene: [phone]   In the event of inclement weather, please call our main line at [phone] for an update on the status of any delays or closures.  Dermatology Medication Tips: Please keep the boxes that topical medications come in in order to help keep track of the instructions about where and how to use these. Pharmacies typically print the medication instructions only on the boxes and not directly on the medication tubes.   If your medication is too expensive, please contact our office at [phone] option 4 or send us  a message through MyChart.   We are unable to tell what your co-pay for medications will be in advance as this is different depending on your insurance coverage. However, we may be able to find a substitute medication at lower cost or fill out paperwork to get insurance to cover a needed medication.   If a prior authorization is required to get your medication covered by your insurance company, please allow us  1-2 business days to complete this process.  Drug prices often vary depending on where the prescription is filled and some pharmacies may offer cheaper prices.  The website www.goodrx.com contains coupons for medications through different pharmacies. The prices here do not account for what the cost may be with help from insurance (it may be cheaper with your insurance), but the website can give you the price if you did not use any insurance.  - You can print the associated coupon and take it with your prescription to the pharmacy.  - You may also stop by our office during regular business hours and pick up a GoodRx coupon card.  - If you need your prescription sent electronically to a different pharmacy, notify our office through [facility] or by phone at [phone] option 4.     Si Usted Necesita Algo Despus de Su Visita  Tambin puede enviarnos un mensaje a travs de Clinical cytogeneticist. Por  lo general respondemos a los mensajes de MyChart en el transcurso de 1 a 2 das hbiles.  Para renovar recetas, por favor pida a su farmacia que se ponga en contacto con nuestra oficina. Randi lakes de fax es [place] [phone].  Si tiene un asunto urgente cuando la clnica est cerrada y que no puede esperar hasta el siguiente da hbil, puede llamar/localizar a su doctor(a) al nmero que aparece a continuacin.   Por favor, tenga en cuenta que aunque hacemos todo lo posible para estar disponibles para asuntos urgentes fuera del horario de [place], no estamos disponibles las 24 horas del da, los 7 [address]  [address] de la [place].   Si tiene un problema urgente y no puede comunicarse con nosotros, puede optar por buscar atencin mdica  en el consultorio de su doctor(a), en una clnica privada, en un centro de atencin urgente o en una sala de emergencias.  Si tiene Engineer, drilling, por favor llame inmediatamente al 911 o vaya a la sala de emergencias.  Nmeros de bper  - Dr. Hester: [phone]  - Dra. Jackquline: [phone]  -  Dr. Claudene: [phone]   En caso de inclemencias del tiempo, por favor llame a nuestra lnea principal al [phone] para una actualizacin sobre el [place] de cualquier retraso o cierre.  Consejos para la medicacin en dermatologa: Por favor, guarde las cajas en las que vienen los medicamentos de uso tpico para ayudarle a seguir las instrucciones sobre dnde y cmo usarlos. Las farmacias generalmente imprimen las instrucciones del medicamento slo en las cajas y no directamente en los tubos del [place].   Si su medicamento es muy caro, por favor, pngase en contacto con landry rieger llamando al [phone] y presione la opcin 4 o envenos un mensaje a travs de Clinical cytogeneticist.   No podemos decirle cul ser su copago por los medicamentos por adelantado ya que esto es diferente dependiendo de la cobertura de su seguro. Sin embargo, es posible que podamos  encontrar un medicamento sustituto a Audiological scientist un formulario para que el seguro cubra el medicamento que se considera necesario.   Si se requiere una autorizacin previa para que su compaa de seguros [place] su medicamento, por favor permtanos de 1 a 2 das hbiles para completar este proceso.  Los precios de los medicamentos varan con frecuencia dependiendo del Environmental consultant de dnde se surte la receta y alguna farmacias pueden ofrecer precios ms baratos.  El sitio web www.goodrx.com tiene cupones para medicamentos de Health and safety inspector. Los precios aqu no tienen en cuenta lo que podra costar con la ayuda del seguro (puede ser ms barato con su seguro), pero el sitio web puede darle el precio si no utiliz Tourist information centre manager.  - Puede imprimir el cupn correspondiente y llevarlo con su receta a la farmacia.  - Tambin puede pasar por nuestra oficina durante el horario de atencin regular y Education officer, museum una tarjeta de cupones de GoodRx.  - Si necesita que su receta se enve electrnicamente a una farmacia diferente, informe a nuestra oficina a travs de MyChart de [place] o por telfono llamando al [phone] y presione la opcin 4.

## 2023-09-28 ENCOUNTER — Other Ambulatory Visit: Payer: Self-pay | Admitting: Dermatology

## 2023-09-28 ENCOUNTER — Other Ambulatory Visit: Payer: Self-pay

## 2023-09-28 DIAGNOSIS — L669 Cicatricial alopecia, unspecified: Secondary | ICD-10-CM

## 2023-09-28 NOTE — Progress Notes (Signed)
 Skin Medicinals compound cream (Hydroquinone 12% / Kojic Acid 6% / Niacinamide 2% / Vitamin C 1% Cream) was not received through e-prescribing. Went online to [employer] and reordered. Patient advised.

## 2023-10-25 ENCOUNTER — Ambulatory Visit: Admitting: Surgery

## 2023-11-10 ENCOUNTER — Ambulatory Visit: Admitting: Surgery

## 2023-11-10 ENCOUNTER — Encounter: Payer: Self-pay | Admitting: Surgery

## 2023-11-10 VITALS — BP 108/71 | HR 97 | Ht 59.0 in | Wt 149.0 lb

## 2023-11-10 DIAGNOSIS — Z8719 Personal history of other diseases of the digestive system: Secondary | ICD-10-CM

## 2023-11-10 DIAGNOSIS — M6208 Separation of muscle (nontraumatic), other site: Secondary | ICD-10-CM | POA: Diagnosis not present

## 2023-11-10 DIAGNOSIS — K439 Ventral hernia without obstruction or gangrene: Secondary | ICD-10-CM

## 2023-11-10 NOTE — Patient Instructions (Signed)
 We would like to have you get a CT scan of this area to determine what may be going on as far as any hernias.  We have scheduled you for a CT Scan of your Abdomen and Pelvis with contrast. This has been scheduled at [facility] on 11/18/23. Please arrive there by 1:45 pm and check in at the [facility] entrance. If you need to reschedule your Scan, you may do so by calling [phone]. Please let us  know if you reschedule your scan as we have to get authorization from your insurance for this.   We will call you with your results to discuss any next steps.    -Do let us  know if your bleeding continues. Recommendations to help with any bleeding related to internal hemorrhoids or post bowel movement trauma. Advised to pursue a goal of 25 to 30 g of fiber daily.  The majority of this may be through natural sources, advised to ensure a minimal daily fiber supplementation.  Various forms of supplements discussed.  Recommend Psyllium husk(metamucil or Citrucel), with options of mixing with beverage or applesauce to make more tolerable. Strongly advised to consume more fluids(especially in proximity to fiber intake) and to ensure adequate hydration.   Watch color of urine to determine adequacy of hydration.  Clarity is pursued in urine output, and bowel activity that responds and corresponds to significant meal intake.   We need to avoid deferring having bowel movements, advised to take the time at the first sign of sensation, typically following meals, and in the morning.  The need to avoid more frequently, and the presence of flatus may indicate the need for bowel movement.  Do not defer for later.  Addition of MiraLAX (or its generic equivalent) may be needed ensure at least twice daily bowel movements.  If multiple doses of MiraLAX are necessary utilize them. Never skip a day... Do not tolerate a day without a bowel movement unless you are fasting.  To be regular, we must do the above EVERY day.   Soluble  Fiber Dissolves in Water: Soluble fiber dissolves in water to form a gel-like substance. Slows Digestion: This type of fiber slows down digestion, which can help control blood sugar levels and lower cholesterol. Sources: Common sources include oats, beans, apples, citrus fruits, and psyllium. Benefits: Helps manage cholesterol levels. Aids in blood sugar control. Increases healthy gut bacteria, which can lower inflammation and improve digestion.  Insoluble Fiber Does Not Dissolve in Water: Insoluble fiber does not dissolve in water and remains mostly intact as it passes through the digestive system. Adds Bulk to Stool: It adds bulk to stool, which helps promote regular bowel movements and prevent constipation. Sources: Common sources include whole grains, nuts, beans, and vegetables like cauliflower and potatoes. Benefits: Improves bowel health and regularity. Reduces the risk of colorectal conditions like hemorrhoids and diverticulitis. Supports insulin sensitivity in people with diabetes.  Both types of fiber are essential for overall health, and it's beneficial to include a 50/50 mix of both in your diet.

## 2023-11-10 NOTE — Progress Notes (Signed)
 Surgical Clinic Progress/Follow-up Note   HPI:  28 y.o. Female , She is status post open repair of umbilical hernia from November 30, 2019, no mesh was used this was done open, vest overpants repair with Ethibond. Over the last 4 months she has noted some episodic pains in the midline cephalad to her area of repair.  It is worse with activity.  She has not seen a bulge, or had a reproducible pain.  It is increased in frequency but she is also now gone from part-time to full-time.  She notes a little blood with her bowel movements only on wiping, over the last 2 to 3 weeks.  She denies traumatic stools or painful bowel movements.  She denies bleeding between bowel activity.  And its limited only to wiping.  She is not interested in submitting to DRE, nor pursuing a colonoscopy at this time.   Review of Systems:  Constitutional: denies fever/chills  ENT: denies sore throat, hearing problems  Respiratory: denies shortness of breath, wheezing  Cardiovascular: denies chest pain, palpitations  Gastrointestinal: denies N/V,  Skin: Denies any other rashes or skin discolorations   BP 108/71   Pulse 97   Ht 4' 11 (1.499 m)   Wt 149 lb (67.6 kg)   SpO2 98%   BMI 30.09 kg/m    Physical Exam:  Constitutional:  -- Normal body habitus  -- Awake, alert, and oriented x3  Pulmonary:  -- Breathing non-labored at rest Cardiovascular:  -- Well-perfused Gastrointestinal:  -- Soft and non-distended, non-tender, no guarding/rebound tenderness.  Points to an area above her umbilicus where she has experienced the discomfort.  We do not reproduce any specific defect/bulge with her head lifted.  She does redemonstrate her diastases recti. -- No abdominal masses appreciated, pulsatile or otherwise. Musculoskeletal / Integumentary:  -- Wounds or skin discoloration: None appreciated -- Extremities: B/L UE and LE FROM, hands and feet warm, no edema   Laboratory studies: None  Imaging: No new pertinent  imaging available for review   Assessment:  28 y.o. yo Female with a problem list including...  Patient Active Problem List   Diagnosis Date Noted   Diastasis recti 11/10/2023   History of umbilical hernia repair 01/17/2020   Preeclampsia in postpartum period 06/19/2018   Hypertension in pregnancy, preeclampsia, severe, delivered/postpartum 06/13/2018   Delivery of pregnancy by cesarean section 06/13/2018   Failed induction of labor 06/13/2018   Fetal intolerance to labor, delivered, current hospitalization 06/13/2018   Gestational hypertension 06/07/2018   Polyhydramnios affecting pregnancy 04/28/2018   Supervision of high risk pregnancy, antepartum 11/18/2017    presents to clinic for follow-up evaluation of possible hernia recurrence, I believe abdominal wall strain/pain associated with diastases recti, otherwise appearing well.  Plan:              - At her request, we will proceed with an abdominal/pelvic CT scan.   -Return to clinic as needed, instructed to call office if any questions or concerns  - We discussed fiber and fluids.  We discussed abdominal wall/core strengthening.  I feel that this is abdominal wall in nature.  I do not appreciate any recurrence in the area of her umbilicus. All of the above recommendations were discussed with the patient, and all of patient's questions were answered to her expressed satisfaction.  Honor Leghorn, MD, FACS [place]: [facility] General Surgery - Partnering for exceptional care. Office: [phone]

## 2023-11-18 ENCOUNTER — Ambulatory Visit
Admission: RE | Admit: 2023-11-18 | Discharge: 2023-11-18 | Disposition: A | Discharge status: Home / Self Care | Source: Ambulatory Visit | Attending: Surgery | Admitting: Surgery

## 2023-11-18 DIAGNOSIS — K439 Ventral hernia without obstruction or gangrene: Secondary | ICD-10-CM | POA: Insufficient documentation

## 2023-11-18 MED ORDER — IOHEXOL 300 MG/ML  SOLN
100.0000 mL | Freq: Once | INTRAMUSCULAR | Status: AC | PRN
  Administered 2023-11-18: 100 mL via INTRAVENOUS

## 2023-12-26 ENCOUNTER — Ambulatory Visit: Admitting: Dermatology

## 2024-01-18 ENCOUNTER — Ambulatory Visit (INDEPENDENT_AMBULATORY_CARE_PROVIDER_SITE_OTHER): Admitting: Dermatology

## 2024-01-18 DIAGNOSIS — L661 Lichen planopilaris, unspecified: Secondary | ICD-10-CM | POA: Diagnosis not present

## 2024-01-18 DIAGNOSIS — L659 Nonscarring hair loss, unspecified: Secondary | ICD-10-CM

## 2024-01-18 DIAGNOSIS — L811 Chloasma: Secondary | ICD-10-CM | POA: Diagnosis not present

## 2024-01-18 DIAGNOSIS — L669 Cicatricial alopecia, unspecified: Secondary | ICD-10-CM

## 2024-01-18 DIAGNOSIS — L700 Acne vulgaris: Secondary | ICD-10-CM

## 2024-01-18 MED ORDER — HYDROQUINONE 8 % EX EMUL: 1.0000 | CUTANEOUS | 1 refills | Status: AC

## 2024-01-18 MED ORDER — WINLEVI 1 % EX CREA: TOPICAL_CREAM | CUTANEOUS | 3 refills | Status: AC

## 2024-01-18 MED ORDER — HYDROQUINONE 8 % EX EMUL: 1.0000 | CUTANEOUS | 1 refills | Status: DC

## 2024-01-18 MED ORDER — SPIRONOLACTONE 100 MG PO TABS: 100.0000 mg | ORAL_TABLET | Freq: Every day | ORAL | 1 refills | Status: AC

## 2024-01-18 MED ORDER — ARAZLO 0.045 % EX LOTN: TOPICAL_LOTION | CUTANEOUS | 2 refills | Status: AC

## 2024-01-18 NOTE — Patient Instructions (Addendum)
 For Melasma  In 2 months start SM48 Hydroquinone 8% / Tretinoin  0.025% / Kojic Acid 1% / Niacinamide 4% / Fluocinolone  0.025% Cream nightly  Instructions for [facility] Medications  One or more of your medications was sent to the [facility] mail order compounding pharmacy. You will receive an email from them and can purchase the medicine through that link. It will then be mailed to your home at the address you confirmed. If for any reason you do not receive an email from them, please check your spam folder. If you still do not find the email, please let us  know. [facility] phone number is [phone].   For Acne  Continue Winlevi  twice a day ( at night apply Winlevi , then Arazlo  then Hydroquinone) Continue Arazlo  lotion nightly Continue Spironolactone  100mg  1 pill a day  For Scalp Decrease Clobetasol  solution nightly 5 days a week, Monday through Friday, avoid face, groin, axilla Continue Ketoconazole  2% shampoo 1-2 times a week, let sit 5 minutes and wash out   Due to recent changes in healthcare laws, you may see results of your pathology and/or laboratory studies on MyChart before the doctors have had a chance to review them. We understand that in some cases there may be results that are confusing or concerning to you. Please understand that not all results are received at the same time and often the doctors may need to interpret multiple results in order to provide you with the best plan of care or course of treatment. Therefore, we ask that you please give us  2 business days to thoroughly review all your results before contacting the office for clarification. Should we see a critical lab result, you will be contacted sooner.   If You Need Anything After Your Visit  If you have any questions or concerns for your doctor, please call our main line at [phone] and press option 4 to reach your doctor's medical assistant. If no one answers, please leave a voicemail as  directed and we will return your call as soon as possible. Messages left after 4 pm will be answered the following business day.   You may also send us  a message via MyChart. We typically respond to MyChart messages within 1-2 business days.  For prescription refills, please ask your pharmacy to contact our office. Our fax number is [phone].  If you have an urgent issue when the clinic is closed that cannot wait until the next business day, you can page your doctor at the number below.    Please note that while we do our best to be available for urgent issues outside of office hours, we are not available 24/7.   If you have an urgent issue and are unable to reach us , you may choose to seek medical care at your doctor's office, retail clinic, urgent care center, or emergency room.  If you have a medical emergency, please immediately call 911 or go to the emergency department.  Pager Numbers  - Dr. Hester: [phone]  - Dr. Jackquline: [phone]  - Dr. Claudene: [phone]   - Dr. Raymund: [phone]  In the event of inclement weather, please call our main line at [phone] for an update on the status of any delays or closures.  Dermatology Medication Tips: Please keep the boxes that topical medications come in in order to help keep track of the instructions about where and how to use these. Pharmacies typically print the medication instructions only on the boxes and not directly on the  medication tubes.   If your medication is too expensive, please contact our office at [phone] option 4 or send us  a message through MyChart.   We are unable to tell what your co-pay for medications will be in advance as this is different depending on your insurance coverage. However, we may be able to find a substitute medication at lower cost or fill out paperwork to get insurance to cover a needed medication.   If a prior authorization is required to get your medication covered by your  insurance company, please allow us  1-2 business days to complete this process.  Drug prices often vary depending on where the prescription is filled and some pharmacies may offer cheaper prices.  The website www.goodrx.com contains coupons for medications through different pharmacies. The prices here do not account for what the cost may be with help from insurance (it may be cheaper with your insurance), but the website can give you the price if you did not use any insurance.  - You can print the associated coupon and take it with your prescription to the pharmacy.  - You may also stop by our office during regular business hours and pick up a GoodRx coupon card.  - If you need your prescription sent electronically to a different pharmacy, notify our office through [facility] or by phone at [phone] option 4.     Si Usted Necesita Algo Despus de Su Visita  Tambin puede enviarnos un mensaje a travs de Clinical Cytogeneticist. Por lo general respondemos a los mensajes de MyChart en el transcurso de 1 a 2 das hbiles.  Para renovar recetas, por favor pida a su farmacia que se ponga en contacto con nuestra oficina. Randi lakes de fax es [place] [phone].  Si tiene un asunto urgente cuando la clnica est cerrada y que no puede esperar hasta el siguiente da hbil, puede llamar/localizar a su doctor(a) al nmero que aparece a continuacin.   Por favor, tenga en cuenta que aunque hacemos todo lo posible para estar disponibles para asuntos urgentes fuera del horario de [place], no estamos disponibles las 24 horas del da, los 7 [address]  [phone] de la [place].   Si tiene un problema urgente y no puede comunicarse con nosotros, puede optar por buscar atencin mdica  en el consultorio de su doctor(a), en una clnica privada, en un centro de atencin urgente o en una sala de emergencias.  Si tiene engineer, drilling, por favor llame inmediatamente al 911 o vaya a la sala de emergencias.  Nmeros de  bper  - Dr. Hester: [phone]  - Dra. Jackquline: [phone]  - Dr. Claudene: [phone]  - Dra. Kitts: [phone]  En caso de inclemencias del [place], por favor llame a nuestra lnea principal al [phone] para una actualizacin sobre el estado de cualquier retraso o cierre.  Consejos para la medicacin en dermatologa: Por favor, guarde las cajas en las que vienen los medicamentos de uso tpico para ayudarle a seguir las instrucciones sobre dnde y cmo usarlos. Las farmacias generalmente imprimen las instrucciones del medicamento slo en las cajas y no directamente en los tubos del [place].   Si su medicamento es muy caro, por favor, pngase en contacto con landry rieger llamando al [phone] y presione la opcin 4 o envenos un mensaje a travs de Clinical Cytogeneticist.   No podemos decirle cul ser su copago por los medicamentos por adelantado ya que esto es diferente dependiendo de la cobertura de su seguro. Sin embargo, es posible que podamos clinical research associate  un medicamento sustituto a audiological scientist un formulario para que el seguro cubra el medicamento que se considera necesario.   Si se requiere una autorizacin previa para que su compaa de seguros cubra su medicamento, por favor permtanos de 1 a 2 das hbiles para completar este proceso.  Los precios de los medicamentos varan con frecuencia dependiendo del environmental consultant de dnde se surte la receta y alguna farmacias pueden ofrecer precios ms baratos.  El sitio web www.goodrx.com tiene cupones para medicamentos de health and safety inspector. Los precios aqu no tienen en cuenta lo que podra costar con la ayuda del seguro (puede ser ms barato con su seguro), pero el sitio web puede darle el precio si no utiliz tourist information centre manager.  - Puede imprimir el cupn correspondiente y llevarlo con su receta a la farmacia.  - Tambin puede pasar por nuestra oficina durante el horario de atencin regular y education officer, museum una tarjeta de cupones de GoodRx.  -  Si necesita que su receta se enve electrnicamente a una farmacia diferente, informe a nuestra oficina a travs de MyChart de [place] o por telfono llamando al [phone] y presione la opcin 4.

## 2024-01-18 NOTE — Progress Notes (Signed)
 Follow-Up Visit   Subjective  Tamara Flynn is a 28 y.o. female who presents for the following: Acne face, Winlevi  bid, Arazlo  lotion qhs, Spironolactone  100mg  1 po qd, pt on yaz and pharmacy advised she may not can take with spironolactone , pt has no other kidney, liver, heart issues, Lichen Planopilaris scalp Clobetasol  sol qd, Ketoconaozle 2% shampoo 1-2x/wk, Melasma face, use SM Hydroquinone 12%/kojic acid/vitamin C cream, some spots lightened but not as much on cheeks.   The following portions of the chart were reviewed this encounter and updated as appropriate: medications, allergies, medical history  Review of Systems:  No other skin or systemic complaints except as noted in HPI or Assessment and Plan.  Objective  Well appearing patient in no apparent distress; mood and affect are within normal limits.   A focused examination was performed of the following areas: Face, scalp  Relevant exam findings are noted in the Assessment and Plan.  Melasma face       Assessment & Plan   ACNE VULGARIS BP 105/74 face Exam: hyperpigmented macules, few closed comedones chin, nose, otherwise clear  Chronic condition with duration or expected duration over one year. Currently well-controlled.    Treatment Plan: Cont Winlevi  bid to face Cont Arazlo  lotion qhs Cont Spironolactone  100mg  1 po qd  Discussed Spironolactone  with Yaz BCP, they are fine to take together in young healthy women, but will check potassium level to confirm/document.  Topical retinoid medications like tretinoin /Retin-A , adapalene/Differin, tazarotene /Fabior, and Epiduo/Epiduo Forte can cause dryness and irritation when first started. Only apply a pea-sized amount to the entire affected area. Avoid applying it around the eyes, edges of mouth and creases at the nose. If you experience irritation, use a good moisturizer first and/or apply the medicine less often. If you are doing well with the medicine, you can  increase how often you use it until you are applying every night. Be careful with sun protection while using this medication as it can make you sensitive to the sun. This medicine should not be used by pregnant women.   Spironolactone  can cause increased urination and cause blood pressure to decrease. Please watch for signs of lightheadedness and be cautious when changing position. It can sometimes cause breast tenderness or an irregular period in premenopausal women. It can also increase potassium. The increase in potassium usually is not a concern unless you are taking other medicines that also increase potassium, so please be sure your doctor knows all of the other medications you are taking. This medication should not be taken by pregnant women.  This medicine should also not be taken together with sulfa  drugs like Bactrim  (trimethoprim /sulfamethexazole).   MELASMA cheeks Exam: reticulated hyperpigmented patches at bil zygoma cheeks, about the same when compared to baseline photos  Chronic and persistent condition with duration or expected duration over one year. Condition is symptomatic/ bothersome to patient. Not currently at goal.   Melasma is a chronic; persistent condition of hyperpigmented patches generally on the face, worse in summer due to higher UV exposure.    Heredity; thyroid  disease; sun exposure; pregnancy; birth control pills; epilepsy medication and darker skin may predispose to Melasma.   Recommendations include: - Sun avoidance and daily broad spectrum (UVA/UVB) tinted mineral sunscreen SPF 30+, with Zinc or Titanium Dioxide. - Rx topical bleaching creams (i.e. hydroquinone) is a common treatment but should not be used long term.  Hydroquinones may be mixed with retinoids; vitamin C; steroids; Kojic Acid. - Alastin A-luminate, retinoids, vitamin  C, topical tranexamic acid, glycolic acid and kojic acid can be used for brightening while on break from hydroquinone - Rx Azelaic  Acid is also a treatment option that is safe for pregnancy (Category B). - OTC Heliocare can be helpful in control and prevention. - Oral Rx with Tranexamic Acid 250 mg - 650 mg po daily can be used for moderate to severe cases especially during summer (contraindications include pregnancy; lactation; hx of PE; hx of DVT; clotting disorder; heart disease; anticoagulant use and upcoming long trips)   - Chemical peels (would need multiple for best result).  - Lasers and  Microdermabrasion may also be helpful adjunct treatments.  Treatment Plan: D/C SM Hydroquinone 12%/kojic acid/vitamin C cream  In 2 months start SM48 Hydroquinone 8% / Tretinoin  0.025% / Kojic Acid 1% / Niacinamide 4% /Fluocinolone  0.025% Cream at bedtime x 2 mo on, 2 mo off, etc. Continue daily broad spectrum (UVA/UVB) tinted mineral sunscreen SPF 30+, with Zinc or Titanium Dioxide. 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 Currently well-controlled.   Lichen planopilaris (LPP) is a chronic inflammatory condition of unknown cause that involves hair follicles in the scalp that can result in permanent (scarring) hair loss. It is treated with topical steroids or other treatments to minimize inflammation and to prevent progression. Therapies may stop or slow progression but generally do not lead to hair regrowth.    Treatment Plan: decrease Clobetasol  sol to hs 5d/wk Monday through Friday,  off on S/S, avoid f/g/a Cont Ketoconazole  2% shampoo 1-2x/wk to wash scalp, let sit 5 minutes and rinse out  Topical steroids (such as triamcinolone , fluocinolone , fluocinonide, mometasone, clobetasol , halobetasol, betamethasone , hydrocortisone ) can cause thinning and lightening of the skin if they are used for too long in the same area. Your physician has selected the  right strength medicine for your problem and area affected on the body. Please use your medication only as directed by your physician to prevent side effects.   ACNE VULGARIS   Related Procedures Potassium Related Medications Clascoterone  (WINLEVI ) 1 % CREA Apply to face BID spironolactone  (ALDACTONE ) 100 MG tablet Take 1 tablet (100 mg total) by mouth at bedtime. Tazarotene  (ARAZLO ) 0.045 % LOTN Apply pea size amount to face nightly as tolerated for acne HAIR LOSS   Related Medications clobetasol  (TEMOVATE ) 0.05 % external solution Apply 1 Application topically 2 (two) times daily. SCARRING ALOPECIA   Related Medications ketoconazole  (NIZORAL ) 2 % shampoo APPLY 1 TIME PER WEEK, MASSAGE INTO SCALP AND LEAVE IN FOR 10 MINUTES BEFORE RINSING OUT  Return in about 6 months (around 07/17/2024) for Acne, Melasma, Planopilaris.  I, Grayce Saunas, RMA, am acting as scribe for Rexene Rattler, MD .   Documentation: I have reviewed the above documentation for accuracy and completeness, and I agree with the above.  Rexene Rattler, MD

## 2024-02-29 ENCOUNTER — Encounter: Payer: Self-pay | Admitting: Dermatology

## 2024-07-17 ENCOUNTER — Ambulatory Visit: Admitting: Dermatology
